# Patient Record
Sex: Female | Born: 1995 | Hispanic: No | Marital: Single | Smoking: Never smoker
Health system: Southern US, Community
[De-identification: ages and names within clinical notes are randomized; demographics above are authoritative.]

## PROBLEM LIST (undated history)

## (undated) ENCOUNTER — Inpatient Hospital Stay: Payer: Self-pay

## (undated) ENCOUNTER — Inpatient Hospital Stay (HOSPITAL_COMMUNITY): Payer: Self-pay

## (undated) DIAGNOSIS — F329 Major depressive disorder, single episode, unspecified: Secondary | ICD-10-CM

## (undated) DIAGNOSIS — F419 Anxiety disorder, unspecified: Secondary | ICD-10-CM

## (undated) DIAGNOSIS — R011 Cardiac murmur, unspecified: Secondary | ICD-10-CM

## (undated) DIAGNOSIS — F32A Depression, unspecified: Secondary | ICD-10-CM

## (undated) DIAGNOSIS — D573 Sickle-cell trait: Secondary | ICD-10-CM

## (undated) DIAGNOSIS — K2970 Gastritis, unspecified, without bleeding: Secondary | ICD-10-CM

## (undated) HISTORY — PX: NO PAST SURGERIES: SHX2092

## (undated) HISTORY — DX: Sickle-cell trait: D57.3

---
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
## 2015-09-18 ENCOUNTER — Encounter (HOSPITAL_COMMUNITY): Payer: Self-pay | Admitting: *Deleted

## 2015-09-18 ENCOUNTER — Emergency Department (HOSPITAL_COMMUNITY)
Admission: EM | Admit: 2015-09-18 | Discharge: 2015-09-18 | Disposition: A | Discharge status: Home / Self Care | Payer: Self-pay | Attending: Emergency Medicine | Admitting: Emergency Medicine

## 2015-09-18 DIAGNOSIS — R011 Cardiac murmur, unspecified: Secondary | ICD-10-CM | POA: Insufficient documentation

## 2015-09-18 DIAGNOSIS — Z3202 Encounter for pregnancy test, result negative: Secondary | ICD-10-CM | POA: Insufficient documentation

## 2015-09-18 DIAGNOSIS — R55 Syncope and collapse: Secondary | ICD-10-CM | POA: Insufficient documentation

## 2015-09-18 HISTORY — DX: Cardiac murmur, unspecified: R01.1

## 2015-09-18 LAB — URINALYSIS, ROUTINE W REFLEX MICROSCOPIC
Bilirubin Urine: NEGATIVE
Glucose, UA: NEGATIVE mg/dL
KETONES UR: NEGATIVE mg/dL
NITRITE: NEGATIVE
PROTEIN: NEGATIVE mg/dL
Specific Gravity, Urine: 1.011 (ref 1.005–1.030)
pH: 6.5 (ref 5.0–8.0)

## 2015-09-18 LAB — BASIC METABOLIC PANEL
Anion gap: 8 (ref 5–15)
BUN: 11 mg/dL (ref 6–20)
CO2: 25 mmol/L (ref 22–32)
CREATININE: 0.77 mg/dL (ref 0.44–1.00)
Calcium: 9.9 mg/dL (ref 8.9–10.3)
Chloride: 111 mmol/L (ref 101–111)
Glucose, Bld: 90 mg/dL (ref 65–99)
POTASSIUM: 3.6 mmol/L (ref 3.5–5.1)
SODIUM: 144 mmol/L (ref 135–145)

## 2015-09-18 LAB — URINE MICROSCOPIC-ADD ON

## 2015-09-18 LAB — CBC
HCT: 36.8 % (ref 36.0–46.0)
Hemoglobin: 12.3 g/dL (ref 12.0–15.0)
MCH: 27.6 pg (ref 26.0–34.0)
MCHC: 33.4 g/dL (ref 30.0–36.0)
MCV: 82.7 fL (ref 78.0–100.0)
PLATELETS: 191 10*3/uL (ref 150–400)
RBC: 4.45 MIL/uL (ref 3.87–5.11)
RDW: 13.3 % (ref 11.5–15.5)
WBC: 8.2 10*3/uL (ref 4.0–10.5)

## 2015-09-18 LAB — I-STAT BETA HCG BLOOD, ED (MC, WL, AP ONLY)

## 2015-09-18 LAB — CBG MONITORING, ED: GLUCOSE-CAPILLARY: 76 mg/dL (ref 65–99)

## 2015-09-18 MED ORDER — NITROFURANTOIN MONOHYD MACRO 100 MG PO CAPS: 100.0000 mg | ORAL_CAPSULE | Freq: Two times a day (BID) | ORAL | Status: DC

## 2015-09-18 MED ORDER — SODIUM CHLORIDE 0.9 % IV BOLUS (SEPSIS)
1000.0000 mL | Freq: Once | INTRAVENOUS | Status: AC
  Administered 2015-09-18: 1000 mL via INTRAVENOUS

## 2015-09-18 NOTE — Discharge Instructions (Signed)

## 2015-09-18 NOTE — ED Notes (Signed)
Patient was alert, oriented and stable upon discharge. RN went over AVS and patient had no further questions.  

## 2015-09-18 NOTE — ED Notes (Signed)
I ATTEMPTED TO COLLECT LABS AND WAS UNSUCCESSFUL. 

## 2015-09-18 NOTE — ED Provider Notes (Signed)
CSN: [id number]     Arrival date & time 09/18/15  1911 History   First MD Initiated Contact with Patient 09/18/15 1959     Chief Complaint  Patient presents with  . Loss of Consciousness    HPI   20 year old female presents today with syncope. Patient reports that she was at the gym running for approximately 2 hours. After that session she went and had a personal training session. She reports that throughout the drink session she was fatigue, feeling lightheaded and dizzy after the training session when she passed out. She denies any injuries from the fall, denies any loss of memory to the events surrounding the fall including presyncopal and post syncopal. No seizure-like activity. Patient reports she's had an event like this before, that was due to her not eating enough food. Patient reports that she did eat a small amount of food at 11:00 today. She denies any preceding chest pain, shortness of breath, nausea or vomiting. She denies any significant past medical history including heart problems, DVT or PE. She is not taking estrogen she has no other risk factors for DVT or PE. She does not smoke, she does not use drugs or alcohol, she has otherwise healthy lifestyle. Patient has no significant family history of cardiac dysfunction including young members of the family dying and a young age from cardiac disease. Patient does not have any exertional dyspnea or chest pain. At the time of evaluation patient reports that she's feeling much better, continues to deny any the above concerning signs or symptoms. She reports that she did not want to come to the emergency room but EMS indicated it was necessary for further evaluation.      Past Medical History  Diagnosis Date  . Heart murmur    History reviewed. No pertinent past surgical history. History reviewed. No pertinent family history. Social History  Substance Use Topics  . Smoking status: Never Smoker   . Smokeless tobacco: None  . Alcohol  Use: None   OB History    No data available     Review of Systems  All other systems reviewed and are negative.    Allergies  Peanuts  Home Medications   Prior to Admission medications   Medication Sig Start Date End Date Taking? Authorizing Provider  nitrofurantoin, macrocrystal-monohydrate, (MACROBID) 100 MG capsule Take 1 capsule (100 mg total) by mouth 2 (two) times daily. 09/18/15   Eyvonne Mechanic, PA-C   BP 111/77 mmHg  Pulse 77  Temp(Src) 98.2 F (36.8 C) (Oral)  Resp 16  Ht  (1.778 m)  Wt 55.792 kg  BMI 17.65 kg/m2  SpO2 99%  LMP 08/18/2015 (Approximate) Physical Exam  Constitutional: She is oriented to person, place, and time. She appears well-developed and well-nourished.  HENT:  Head: Normocephalic and atraumatic.  Eyes: Conjunctivae are normal. Pupils are equal, round, and reactive to light. Right eye exhibits no discharge. Left eye exhibits no discharge. No scleral icterus.  Neck: Normal range of motion. No JVD present. No tracheal deviation present.  Cardiovascular: Normal rate, regular rhythm, normal heart sounds and intact distal pulses.  Exam reveals no gallop and no friction rub.   No murmur heard. Pulmonary/Chest: Effort normal. No stridor. No respiratory distress. She has no wheezes. She has no rales.  Abdominal: Soft. She exhibits no distension and no mass. There is no tenderness. There is no rebound and no guarding.  Musculoskeletal: Normal range of motion. She exhibits no edema.  Neurological: She is alert  and oriented to person, place, and time. She has normal strength. No cranial nerve deficit or sensory deficit. Coordination normal. GCS eye subscore is 4. GCS verbal subscore is 5. GCS motor subscore is 6.  Skin: Skin is warm and dry. No rash noted. No erythema. No pallor.  Psychiatric: She has a normal mood and affect. Her behavior is normal. Judgment and thought content normal.  Nursing note and vitals reviewed.   ED Course  Procedures  (including critical care time) Labs Review Labs Reviewed  URINALYSIS, ROUTINE W REFLEX MICROSCOPIC (NOT AT [facility]) - Abnormal; [facility] for the following:    Hgb urine dipstick TRACE (*)    Leukocytes, UA LARGE (*)    All other components within normal limits  URINE MICROSCOPIC-ADD ON - Abnormal; Notable for the following:    Squamous Epithelial / LPF TOO NUMEROUS TO COUNT (*)    Bacteria, UA MANY (*)    All other components within normal limits  URINE CULTURE  BASIC METABOLIC PANEL  CBC  CBG MONITORING, ED  I-STAT BETA HCG BLOOD, ED ([facility], [facility], [facility] ONLY)    Imaging Review No results found. I have personally reviewed and evaluated these images and lab results as part of my medical decision-making.   EKG Interpretation   Date/Time:  Monday September 18 2015 19:22:35 EST Ventricular Rate:  87 PR Interval:  179 QRS Duration: 82 QT Interval:  338 QTC Calculation: 407 R Axis:   87 Text Interpretation:  Sinus rhythm LAE, consider biatrial enlargement RSR'  in V1 or V2, probably normal variant Nonspecific T abnrm, anterolateral  leads   No significant change since last tracing Confirmed by Ethelda Chick   MD, SAM [phone]) on 09/18/2015 8:10:32 PM      MDM   Final diagnoses:  Syncope, unspecified syncope type    Labs: Urine culture, i-STAT beta-hCG, CBC, BMP, urinalysis- large leukocytes WBC 1630, many bacteria, too numerous to count squamous epithelial cells  Imaging:  Consults:  Therapeutics: Normal saline  Discharge Meds:   Assessment/Plan: Patient presents status post syncope. This was most likely due to her exhaustion from physical activity. She does not have any signs or symptoms that would indicate cardiac no pulmonary related etiology. Patient is an otherwise healthy young female, is back to her baseline during my evaluation with no complaints. She has no significant findings on historical evaluation, diagnostic or laboratory evaluation here that would necessitate further  evaluation or management. Patient is happy and pleasant throw the events week. Since his. She did incidentally have a urinary tract infection, repeat evaluation of urinary color clarity and characteristics revealed the patient did have darker borders urine recently. She reports that she has been seen before with incidental findings of urinary tract infection. She was given a prescription for antibiotics and instructed to initiate therapy. Patient verbalized understanding and agreement for today's plan had no further questions or concerns at the time of discharge. She is encouraged follow-up with her primary care provider for reevaluation.        Eyvonne Mechanic, PA-C 09/19/15 0153  Lyndal Pulley, MD 09/20/15 [phone]

## 2015-09-18 NOTE — ED Notes (Signed)
Per [facility] - pt from the gym s/p syncope - pt admits she has been working out x2 hrs and has not eaten since 11am. Pt w/o orthostatic changes for EMS, c/o dizziness and nausea at present.

## 2015-09-18 NOTE — ED Notes (Signed)
Bed: WJ19 Expected date:  Expected time:  Means of arrival:  Comments: EMS- syncopal at gym, dizziness

## 2015-09-20 LAB — URINE CULTURE
Culture: >=100000
Special Requests: NORMAL

## 2016-05-07 ENCOUNTER — Encounter (HOSPITAL_COMMUNITY): Payer: Self-pay | Admitting: Emergency Medicine

## 2016-05-07 ENCOUNTER — Emergency Department (HOSPITAL_COMMUNITY)
Admission: EM | Admit: 2016-05-07 | Discharge: 2016-05-07 | Disposition: A | Discharge status: Home / Self Care | Payer: 59 | Attending: Emergency Medicine | Admitting: Emergency Medicine

## 2016-05-07 DIAGNOSIS — Z791 Long term (current) use of non-steroidal anti-inflammatories (NSAID): Secondary | ICD-10-CM | POA: Insufficient documentation

## 2016-05-07 DIAGNOSIS — R45851 Suicidal ideations: Secondary | ICD-10-CM | POA: Diagnosis present

## 2016-05-07 DIAGNOSIS — Z79899 Other long term (current) drug therapy: Secondary | ICD-10-CM | POA: Diagnosis not present

## 2016-05-07 DIAGNOSIS — F329 Major depressive disorder, single episode, unspecified: Secondary | ICD-10-CM | POA: Diagnosis not present

## 2016-05-07 DIAGNOSIS — F32A Depression, unspecified: Secondary | ICD-10-CM

## 2016-05-07 HISTORY — DX: Major depressive disorder, single episode, unspecified: F32.9

## 2016-05-07 HISTORY — DX: Depression, unspecified: F32.A

## 2016-05-07 NOTE — ED Provider Notes (Signed)
[facility]-EMERGENCY DEPT Provider Note   CSN: [id number] Arrival date & time: 05/07/16  1038     History   Chief Complaint Chief Complaint  Patient presents with  . Suicidal   Chief complaint depression HPI [name] is a 20 y.o. female.  HPI patient reports that she suffers from long-standing depression over poor health and family members" family issues" she spoke with a counselor at [facility] earlier today who brought her here for further evaluation. Patient vehemently denies feeling suicidal or wanting to harm herself. She doesn't wish counseling for depression. Nothing makes symptoms better or worse. No treatment prior to coming here no other associated symptoms Past Medical History:  Diagnosis Date  . Depression   . Heart murmur     There are no active problems to display for this patient.   History reviewed. No pertinent surgical history.  OB History    No data available       Home Medications    Prior to Admission medications   Medication Sig Start Date End Date Taking? Authorizing Provider  ibuprofen (ADVIL,MOTRIN) 200 MG tablet Take 200 mg by mouth every 6 (six) hours as needed for headache or moderate pain.   Yes Historical Provider, MD  Multiple Vitamin (MULTIVITAMIN WITH MINERALS) TABS tablet Take 1 tablet by mouth daily.   Yes Historical Provider, MD    Family History No family history on file.  Social History Social History  Substance Use Topics  . Smoking status: Never Smoker  . Smokeless tobacco: Never Used  . Alcohol use Not on file   Positive smoker occasional alcohol no illicit drug use  Allergies   Peanuts [peanut oil]   Review of Systems Review of Systems  Constitutional: Negative.   HENT: Negative.   Respiratory: Negative.   Cardiovascular: Negative.   Gastrointestinal: Negative.   Musculoskeletal: Negative.   Skin: Negative.   Neurological: Negative.   Psychiatric/Behavioral: Positive for dysphoric mood.  All other systems  reviewed and are negative.    Physical Exam Updated Vital Signs BP 122/84 (BP Location: Right Arm)   Pulse 94   Resp 22   Ht 5\' 10"  (1.778 m)   Wt 124 lb (56.2 kg)   SpO2 93%   BMI 17.79 kg/m   Physical Exam  Constitutional: She is oriented to person, place, and time. She appears well-developed and well-nourished. No distress.  HENT:  Head: Normocephalic and atraumatic.  Eyes: Conjunctivae and EOM are normal.  Neck: Neck supple. No tracheal deviation present.  Cardiovascular: Normal rate.   No murmur heard. Pulmonary/Chest: Effort normal.  Abdominal: Soft. She exhibits no distension.  Musculoskeletal: Normal range of motion. She exhibits no edema.  Neurological: She is alert and oriented to person, place, and time. No cranial nerve deficit. Coordination normal.  Gait normal  Skin: Skin is warm and dry. No rash noted.  Psychiatric: She has a normal mood and affect.  Nursing note and vitals reviewed.    ED Treatments / Results  Labs (all labs ordered are listed, but only abnormal results are displayed) Labs Reviewed  COMPREHENSIVE METABOLIC PANEL  ETHANOL  SALICYLATE LEVEL  ACETAMINOPHEN LEVEL  CBC  URINE RAPID DRUG SCREEN, HOSP PERFORMED    EKG  EKG Interpretation None       Radiology No results found.  Procedures Procedures (including critical care time)  Medications Ordered in ED Medications - No data to display   Initial Impression / Assessment and Plan / ED Course  I have reviewed the  triage vital signs and the nursing notes.  Pertinent labs & imaging results that were available during my care of the patient were reviewed by me and considered in my medical decision making (see chart for details). 1:30 PM patient again vehemently denies that she wants to harm herself. She'll be referred for outpatient counseling Clinical Course    TTS consulted to arrange for outpatient counseling. I don't believe patient is suicidal risk. She's been advised to  call [phone] if she has any thoughts of harming herself  Final Clinical Impressions(s) / ED Diagnoses  Diagnosis depression Final diagnoses:  None    New Prescriptions New Prescriptions   No medications on file     Doug SouSam Shirlena Brinegar, MD 05/07/16 1334
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
## 2016-05-07 NOTE — ED Triage Notes (Addendum)
Pt reports feelings of depression and acute SI for about one week.  Reports hx of depression.  Pt reports feeling nauseous and extremely anxious at this time.  Denies emesis or abdominal issues.  Reports that she "does not have a plan" but her counselor, who brought her to ED, claims otherwise.  Counselor reports that "something happened last night" but pt refuses to speak about it.  Pt is pacing in room and tried to walk out the door once.  Pt displays aggressive behavior towards counselor when pressed for details about her plan.

## 2016-05-07 NOTE — ED Notes (Signed)
Patient has three bags of belongings in locker 31.

## 2016-05-07 NOTE — Discharge Instructions (Signed)
Call any of the resources furnished to you for counseling with depression. Or you can go to your counselor [facility]. If you have any thoughts of harming yourself, call 911 immediately

## 2016-05-07 NOTE — Progress Notes (Signed)
Pt [facility] by [facility] staff  Pt states she has insurance with [employer] but did not bring her insurance card Pt listed per ED registration as uninsured at this time with [place] address  Pt encouraged to bring her insurance card back in to ED registration

## 2016-05-07 NOTE — ED Notes (Signed)
Patient seen attempting to leave. Security paged to escort patient back to hall bed A. Dr. Ethelda ChickJacubowitz informed of same. States "she isn't suicidal, she's just waiting for TTS consult". TTS at bedside.

## 2016-05-07 NOTE — ED Notes (Signed)
Bed: WTR6 Expected date:  Expected time:  Means of arrival:  Comments: 

## 2016-05-07 NOTE — ED Notes (Signed)
Per EDP Jacubowitz, no labs or urine needed.

## 2016-05-07 NOTE — ED Notes (Signed)
Patient given belongings from locker 31, one black bookbag and two patient belongings bags.

## 2016-05-07 NOTE — ED Notes (Signed)
Pt ambulatory to lobby. NAD noted. 

## 2016-05-27 ENCOUNTER — Inpatient Hospital Stay (HOSPITAL_COMMUNITY)
Admission: AD | Admit: 2016-05-27 | Discharge: 2016-05-27 | Disposition: A | Discharge status: Home / Self Care | Payer: 59 | Source: Ambulatory Visit | Attending: Obstetrics and Gynecology | Admitting: Obstetrics and Gynecology

## 2016-05-27 ENCOUNTER — Inpatient Hospital Stay (HOSPITAL_COMMUNITY): Payer: 59

## 2016-05-27 ENCOUNTER — Encounter (HOSPITAL_COMMUNITY): Payer: Self-pay | Admitting: *Deleted

## 2016-05-27 DIAGNOSIS — O26891 Other specified pregnancy related conditions, first trimester: Secondary | ICD-10-CM | POA: Diagnosis not present

## 2016-05-27 DIAGNOSIS — R102 Pelvic and perineal pain: Secondary | ICD-10-CM

## 2016-05-27 DIAGNOSIS — O3680X0 Pregnancy with inconclusive fetal viability, not applicable or unspecified: Secondary | ICD-10-CM

## 2016-05-27 DIAGNOSIS — O9989 Other specified diseases and conditions complicating pregnancy, childbirth and the puerperium: Secondary | ICD-10-CM | POA: Diagnosis not present

## 2016-05-27 LAB — CBC
HEMATOCRIT: 37.8 % (ref 36.0–46.0)
HEMOGLOBIN: 13 g/dL (ref 12.0–15.0)
MCH: 27.6 pg (ref 26.0–34.0)
MCHC: 34.4 g/dL (ref 30.0–36.0)
MCV: 80.3 fL (ref 78.0–100.0)
Platelets: 222 10*3/uL (ref 150–400)
RBC: 4.71 MIL/uL (ref 3.87–5.11)
RDW: 12.6 % (ref 11.5–15.5)
WBC: 7.4 10*3/uL (ref 4.0–10.5)

## 2016-05-27 LAB — HCG, QUANTITATIVE, PREGNANCY: hCG, Beta Chain, Quant, S: 1579 m[IU]/mL — ABNORMAL HIGH (ref <5)

## 2016-05-27 LAB — URINALYSIS, ROUTINE W REFLEX MICROSCOPIC
Bilirubin Urine: NEGATIVE
GLUCOSE, UA: NEGATIVE mg/dL
HGB URINE DIPSTICK: NEGATIVE
Ketones, ur: NEGATIVE mg/dL
Leukocytes, UA: NEGATIVE
Nitrite: NEGATIVE
PH: 6.5 (ref 5.0–8.0)
Protein, ur: NEGATIVE mg/dL

## 2016-05-27 LAB — WET PREP, GENITAL
SPERM: NONE SEEN
Trich, Wet Prep: NONE SEEN
Yeast Wet Prep HPF POC: NONE SEEN

## 2016-05-27 LAB — ABO/RH: ABO/RH(D): AB POS

## 2016-05-27 LAB — POCT PREGNANCY, URINE: Preg Test, Ur: POSITIVE — AB

## 2016-05-27 NOTE — Discharge Instructions (Signed)
First Trimester of Pregnancy The first trimester of pregnancy is from week 1 until the end of week 12 (months 1 through 3). A week after a sperm fertilizes an egg, the egg will implant on the wall of the uterus. This embryo will begin to develop into a baby. Genes from you and your partner are forming the baby. The female genes determine whether the baby is a boy or a girl. At 6-8 weeks, the eyes and face are formed, and the heartbeat can be seen on ultrasound. At the end of 12 weeks, all the baby's organs are formed.  Now that you are pregnant, you will want to do everything you can to have a healthy baby. Two of the most important things are to get good prenatal care and to follow your health care provider's instructions. Prenatal care is all the medical care you receive before the baby's birth. This care will help prevent, find, and treat any problems during the pregnancy and childbirth. BODY CHANGES Your body goes through many changes during pregnancy. The changes vary from woman to woman.   You may gain or lose a couple of pounds at first.  You may feel sick to your stomach (nauseous) and throw up (vomit). If the vomiting is uncontrollable, call your health care provider.  You may tire easily.  You may develop headaches that can be relieved by medicines approved by your health care provider.  You may urinate more often. Painful urination may mean you have a bladder infection.  You may develop heartburn as a result of your pregnancy.  You may develop constipation because certain hormones are causing the muscles that push waste through your intestines to slow down.  You may develop hemorrhoids or swollen, bulging veins (varicose veins).  Your breasts may begin to grow larger and become tender. Your nipples may stick out more, and the tissue that surrounds them (areola) may become darker.  Your gums may bleed and may be sensitive to brushing and flossing.  Dark spots or blotches (chloasma,  mask of pregnancy) may develop on your face. This will likely fade after the baby is born.  Your menstrual periods will stop.  You may have a loss of appetite.  You may develop cravings for certain kinds of food.  You may have changes in your emotions from day to day, such as being excited to be pregnant or being concerned that something may go wrong with the pregnancy and baby.  You may have more vivid and strange dreams.  You may have changes in your hair. These can include thickening of your hair, rapid growth, and changes in texture. Some women also have hair loss during or after pregnancy, or hair that feels dry or thin. Your hair will most likely return to normal after your baby is born. WHAT TO EXPECT AT YOUR PRENATAL VISITS During a routine prenatal visit:  You will be weighed to make sure you and the baby are growing normally.  Your blood pressure will be taken.  Your abdomen will be measured to track your baby's growth.  The fetal heartbeat will be listened to starting around week 10 or 12 of your pregnancy.  Test results from any previous visits will be discussed. Your health care provider may ask you:  How you are feeling.  If you are feeling the baby move.  If you have had any abnormal symptoms, such as leaking fluid, bleeding, severe headaches, or abdominal cramping.  If you are using any tobacco products,   including cigarettes, chewing tobacco, and electronic cigarettes.  If you have any questions. Other tests that may be performed during your first trimester include:  Blood tests to find your blood type and to check for the presence of any previous infections. They will also be used to check for low iron levels (anemia) and Rh antibodies. Later in the pregnancy, blood tests for diabetes will be done along with other tests if problems develop.  Urine tests to check for infections, diabetes, or protein in the urine.  An ultrasound to confirm the proper growth  and development of the baby.  An amniocentesis to check for possible genetic problems.  Fetal screens for spina bifida and Down syndrome.  You may need other tests to make sure you and the baby are doing well.  HIV (human immunodeficiency virus) testing. Routine prenatal testing includes screening for HIV, unless you choose not to have this test. HOME CARE INSTRUCTIONS  Medicines  Follow your health care provider's instructions regarding medicine use. Specific medicines may be either safe or unsafe to take during pregnancy.  Take your prenatal vitamins as directed.  If you develop constipation, try taking a stool softener if your health care provider approves. Diet  Eat regular, well-balanced meals. Choose a variety of foods, such as meat or vegetable-based protein, fish, milk and low-fat dairy products, vegetables, fruits, and whole grain breads and cereals. Your health care provider will help you determine the amount of weight gain that is right for you.  Avoid raw meat and uncooked cheese. These carry germs that can cause birth defects in the baby.  Eating four or five small meals rather than three large meals a day may help relieve nausea and vomiting. If you start to feel nauseous, eating a few soda crackers can be helpful. Drinking liquids between meals instead of during meals also seems to help nausea and vomiting.  If you develop constipation, eat more high-fiber foods, such as fresh vegetables or fruit and whole grains. Drink enough fluids to keep your urine clear or pale yellow. Activity and Exercise  Exercise only as directed by your health care provider. Exercising will help you:  Control your weight.  Stay in shape.  Be prepared for labor and delivery.  Experiencing pain or cramping in the lower abdomen or low back is a good sign that you should stop exercising. Check with your health care provider before continuing normal exercises.  Try to avoid standing for long  periods of time. Move your legs often if you must stand in one place for a long time.  Avoid heavy lifting.  Wear low-heeled shoes, and practice good posture.  You may continue to have sex unless your health care provider directs you otherwise. Relief of Pain or Discomfort  Wear a good support bra for breast tenderness.   Take warm sitz baths to soothe any pain or discomfort caused by hemorrhoids. Use hemorrhoid cream if your health care provider approves.   Rest with your legs elevated if you have leg cramps or low back pain.  If you develop varicose veins in your legs, wear support hose. Elevate your feet for 15 minutes, 3-4 times a day. Limit salt in your diet. Prenatal Care  Schedule your prenatal visits by the twelfth week of pregnancy. They are usually scheduled monthly at first, then more often in the last 2 months before delivery.  Write down your questions. Take them to your prenatal visits.  Keep all your prenatal visits as directed by your   health care provider. Safety  Wear your seat belt at all times when driving.  Make a list of emergency phone numbers, including numbers for family, friends, the hospital, and police and fire departments. General Tips  Ask your health care provider for a referral to a local prenatal education class. Begin classes no later than at the beginning of month 6 of your pregnancy.  Ask for help if you have counseling or nutritional needs during pregnancy. Your health care provider can offer advice or refer you to specialists for help with various needs.  Do not use hot tubs, steam rooms, or saunas.  Do not douche or use tampons or scented sanitary pads.  Do not cross your legs for long periods of time.  Avoid cat litter boxes and soil used by cats. These carry germs that can cause birth defects in the baby and possibly loss of the fetus by miscarriage or stillbirth.  Avoid all smoking, herbs, alcohol, and medicines not prescribed by  your health care provider. Chemicals in these affect the formation and growth of the baby.  Do not use any tobacco products, including cigarettes, chewing tobacco, and electronic cigarettes. If you need help quitting, ask your health care provider. You may receive counseling support and other resources to help you quit.  Schedule a dentist appointment. At home, brush your teeth with a soft toothbrush and be gentle when you floss. SEEK MEDICAL CARE IF:   You have dizziness.  You have mild pelvic cramps, pelvic pressure, or nagging pain in the abdominal area.  You have persistent nausea, vomiting, or diarrhea.  You have a bad smelling vaginal discharge.  You have pain with urination.  You notice increased swelling in your face, hands, legs, or ankles. SEEK IMMEDIATE MEDICAL CARE IF:   You have a fever.  You are leaking fluid from your vagina.  You have spotting or bleeding from your vagina.  You have severe abdominal cramping or pain.  You have rapid weight gain or loss.  You vomit blood or material that looks like coffee grounds.  You are exposed to German measles and have never had them.  You are exposed to fifth disease or chickenpox.  You develop a severe headache.  You have shortness of breath.  You have any kind of trauma, such as from a fall or a car accident.   This information is not intended to replace advice given to you by your health care provider. Make sure you discuss any questions you have with your health care provider.   Document Released: 08/06/2001 Document Revised: 09/02/2014 Document Reviewed: 06/22/2013 Elsevier Interactive Patient Education 2016 [employer]  

## 2016-05-27 NOTE — MAU Note (Signed)
Pt states she is having lower abdominal cramping that has been going on for one week.  Pt states she missed her last period and has not taken a HPT.

## 2016-05-27 NOTE — MAU Provider Note (Signed)
History     CSN: [id number]  Arrival date and time: 05/27/16 1603   First Provider Initiated Contact with Patient 05/27/16 2139      Chief Complaint  Patient presents with  . Pelvic Pain   Pelvic Pain  The patient's primary symptoms include pelvic pain. This is a new problem. The current episode started in the past 7 days. The problem occurs intermittently. The problem has been unchanged. Pain severity now: 6/10  The problem affects both sides. She is pregnant. Associated symptoms include abdominal pain. Pertinent negatives include no chills, constipation, diarrhea, dysuria, fever, frequency, nausea, urgency or vomiting. The vaginal discharge was normal. There has been no bleeding. Nothing aggravates the symptoms. She has tried nothing for the symptoms. Her menstrual history has been regular (LMP 03/2916).   Past Medical History:  Diagnosis Date  . Depression   . Heart murmur     Past Surgical History:  Procedure Laterality Date  . NO PAST SURGERIES      History reviewed. No pertinent family history.  Social History  Substance Use Topics  . Smoking status: Never Smoker  . Smokeless tobacco: Never Used  . Alcohol use Not on file    Allergies:  Allergies  Allergen Reactions  . Peanuts [Peanut Oil] Anaphylaxis, Hives and Swelling    Tree nuts also     Prescriptions Prior to Admission  Medication Sig Dispense Refill Last Dose  . Multiple Vitamin (MULTIVITAMIN WITH MINERALS) TABS tablet Take 1 tablet by mouth daily.   05/27/2016 at Unknown time  . Phenylephrine-Pheniramine-DM (THERAFLU COLD & COUGH PO) Take 5 mLs by mouth daily as needed (cold symptoms).   Past Week at Unknown time  . ibuprofen (ADVIL,MOTRIN) 200 MG tablet Take 200 mg by mouth every 6 (six) hours as needed for headache or moderate pain.   Not Taking at Unknown time    Review of Systems  Constitutional: Negative for chills and fever.  Gastrointestinal: Positive for abdominal pain. Negative for  constipation, diarrhea, nausea and vomiting.  Genitourinary: Positive for pelvic pain. Negative for dysuria, frequency and urgency.   Physical Exam   Blood pressure 110/77, pulse 75, temperature 98.4 F (36.9 C), temperature source Oral, resp. rate 18, height 5\' 10"  (1.778 m), weight 128 lb (58.1 kg), last menstrual period 04/23/2016, SpO2 100 %.  Physical Exam  Nursing note and vitals reviewed. Constitutional: She is oriented to person, place, and time. She appears well-developed and well-nourished. No distress.  HENT:  Head: Normocephalic.  Cardiovascular: Normal rate.   Respiratory: Effort normal.  GI: Soft. There is no tenderness. There is no rebound.  Neurological: She is alert and oriented to person, place, and time.  Skin: Skin is warm and dry.  Psychiatric: She has a normal mood and affect.     Results for orders placed or performed during the hospital encounter of 05/27/16 (from the past 24 hour(s))  Urinalysis, Routine w reflex microscopic (not at [facility])     Status: Abnormal   Collection Time: 05/27/16  5:33 PM  Result Value Ref Range   Color, Urine STRAW (A) YELLOW   APPearance CLEAR CLEAR   Specific Gravity, Urine <1.005 (L) 1.005 - 1.030   pH 6.5 5.0 - 8.0   Glucose, UA NEGATIVE NEGATIVE mg/dL   Hgb urine dipstick NEGATIVE NEGATIVE   Bilirubin Urine NEGATIVE NEGATIVE   Ketones, ur NEGATIVE NEGATIVE mg/dL   Protein, ur NEGATIVE NEGATIVE mg/dL   Nitrite NEGATIVE NEGATIVE   Leukocytes, UA NEGATIVE NEGATIVE  Pregnancy,  urine POC     Status: Abnormal   Collection Time: 05/27/16  5:46 PM  Result Value Ref Range   Preg Test, Ur POSITIVE (A) NEGATIVE  CBC     Status: None   Collection Time: 05/27/16  8:21 PM  Result Value Ref Range   WBC 7.4 4.0 - 10.5 K/uL   RBC 4.71 3.87 - 5.11 MIL/uL   Hemoglobin 13.0 12.0 - 15.0 g/dL   HCT 16.1 09.6 - 04.5 %   MCV 80.3 78.0 - 100.0 fL   MCH 27.6 26.0 - 34.0 pg   MCHC 34.4 30.0 - 36.0 g/dL   RDW 40.9 81.1 - 91.4 %   Platelets  222 150 - 400 K/uL  hCG, quantitative, pregnancy     Status: Abnormal   Collection Time: 05/27/16  8:21 PM  Result Value Ref Range   hCG, Beta Chain, Quant, S 1,579 (H) <5 mIU/mL  ABO/Rh     Status: None (Preliminary result)   Collection Time: 05/27/16  8:21 PM  Result Value Ref Range   ABO/RH(D) AB POS   Wet prep, genital     Status: Abnormal   Collection Time: 05/27/16  9:26 PM  Result Value Ref Range   Yeast Wet Prep HPF POC NONE SEEN NONE SEEN   Trich, Wet Prep NONE SEEN NONE SEEN   Clue Cells Wet Prep HPF POC PRESENT (A) NONE SEEN   WBC, Wet Prep HPF POC FEW (A) NONE SEEN   Sperm NONE SEEN    US Ob Comp Less 14 Wks  Result Date: 05/27/2016 CLINICAL DATA:  Pelvic pain for 1 week. Pending quantitative beta HCG EXAM: OBSTETRIC <14 WK [place] AND TRANSVAGINAL OB US TECHNIQUE: Both transabdominal and transvaginal ultrasound examinations were performed for complete evaluation of the gestation as well as the maternal uterus, adnexal regions, and pelvic cul-de-sac. Transvaginal technique was performed to assess early pregnancy. COMPARISON:  None. FINDINGS: Intrauterine gestational sac: None Yolk sac:  None Embryo:  None Cardiac Activity: None Heart Rate: Not applicable MSD: Not applicable CRL:  Not applicable Subchorionic hemorrhage:  None visualized. Maternal uterus/adnexae: The uterus is anteverted without focal mass. Endometrial stripe is 13 mm in thickness. The ovaries are unremarkable. Intrauterine gestational sac: None IMPRESSION: No intrauterine or ectopic pregnancy identified. Electronically Signed   By: Tollie Eth M.D.   On: 05/27/2016 21:45   US Ob Transvaginal  Result Date: 05/27/2016 CLINICAL DATA:  Pelvic pain for 1 week. Pending quantitative beta HCG EXAM: OBSTETRIC <14 WK [place] AND TRANSVAGINAL OB US TECHNIQUE: Both transabdominal and transvaginal ultrasound examinations were performed for complete evaluation of the gestation as well as the maternal uterus, adnexal regions, and pelvic  cul-de-sac. Transvaginal technique was performed to assess early pregnancy. COMPARISON:  None. FINDINGS: Intrauterine gestational sac: None Yolk sac:  None Embryo:  None Cardiac Activity: None Heart Rate: Not applicable MSD: Not applicable CRL:  Not applicable Subchorionic hemorrhage:  None visualized. Maternal uterus/adnexae: The uterus is anteverted without focal mass. Endometrial stripe is 13 mm in thickness. The ovaries are unremarkable. Intrauterine gestational sac: None IMPRESSION: No intrauterine or ectopic pregnancy identified. Electronically Signed   By: Tollie Eth M.D.   On: 05/27/2016 21:45     MAU Course  Procedures  MDM   Assessment and Plan   1. Pregnancy, location unknown   2. Pelvic pain affecting pregnancy in first trimester, antepartum    DC home Comfort measures reviewed  1stTrimester precautions  Ectopic precautions RX: none  Return to MAU  as needed FU with clinic for repeat HCG on Thursday   Follow-up Information    [facility]   Specialty:  Obstetrics and Gynecology Why:  Thursday 05/30/16 8:00 am  Contact information: [address] [place] [id number] [phone]           Tawnya CrookHogan, Kiahna Banghart Donovan 05/27/2016, 9:40 PM

## 2016-05-28 LAB — GC/CHLAMYDIA PROBE AMP (~~LOC~~) NOT AT ARMC
Chlamydia: NEGATIVE
Neisseria Gonorrhea: NEGATIVE

## 2016-05-28 LAB — HIV ANTIBODY (ROUTINE TESTING W REFLEX): HIV Screen 4th Generation wRfx: NONREACTIVE

## 2016-05-28 LAB — RPR: RPR: NONREACTIVE

## 2016-05-30 ENCOUNTER — Ambulatory Visit: Payer: 59 | Admitting: *Deleted

## 2016-05-30 DIAGNOSIS — O3680X0 Pregnancy with inconclusive fetal viability, not applicable or unspecified: Secondary | ICD-10-CM

## 2016-05-30 LAB — HCG, QUANTITATIVE, PREGNANCY: HCG, BETA CHAIN, QUANT, S: 3500 m[IU]/mL — AB (ref <5)

## 2016-05-30 NOTE — Progress Notes (Signed)
Pt in for 48 hr repeat hcg level. She had ultrasound on 10/2 that showed no baby. HCG level on 10/2 was 1579. Pt denies bleeding or pain. Agreed to wait for hcg level results.  Pts results today are 3500. Reviewed with Dr. Vergie LivingPickens who recommends that patient have a 14 day ultrasound. Appointment made for 06/13/16 at 330. Pt aware of appointment. Also discussed reasons to return to MAU bleeding or increased pain. Pt voiced understanding.

## 2016-06-13 ENCOUNTER — Ambulatory Visit (HOSPITAL_COMMUNITY)
Admission: RE | Admit: 2016-06-13 | Discharge: 2016-06-13 | Disposition: A | Discharge status: Home / Self Care | Payer: 59 | Source: Ambulatory Visit | Attending: Obstetrics and Gynecology | Admitting: Obstetrics and Gynecology

## 2016-06-13 DIAGNOSIS — Z3A01 Less than 8 weeks gestation of pregnancy: Secondary | ICD-10-CM | POA: Diagnosis not present

## 2016-06-13 DIAGNOSIS — Z362 Encounter for other antenatal screening follow-up: Secondary | ICD-10-CM | POA: Insufficient documentation

## 2016-06-13 DIAGNOSIS — O3680X0 Pregnancy with inconclusive fetal viability, not applicable or unspecified: Secondary | ICD-10-CM

## 2016-06-24 ENCOUNTER — Telehealth: Payer: Self-pay | Admitting: *Deleted

## 2016-06-24 NOTE — Telephone Encounter (Signed)
Patient called front desk upset because she had an u/s on 10/19 and was told she would be called with results and she has not had a call. U/s results given to patient.

## 2016-07-02 LAB — OB RESULTS CONSOLE GC/CHLAMYDIA
Chlamydia: NEGATIVE
GC PROBE AMP, GENITAL: NEGATIVE

## 2016-07-02 LAB — OB RESULTS CONSOLE ANTIBODY SCREEN: Antibody Screen: NEGATIVE

## 2016-07-02 LAB — OB RESULTS CONSOLE HEPATITIS B SURFACE ANTIGEN: HEP B S AG: NEGATIVE

## 2016-07-02 LAB — OB RESULTS CONSOLE PLATELET COUNT: Platelets: 205 10*3/uL

## 2016-07-02 LAB — OB RESULTS CONSOLE VARICELLA ZOSTER ANTIBODY, IGG: Varicella: IMMUNE

## 2016-07-02 LAB — OB RESULTS CONSOLE ABO/RH: RH TYPE: POSITIVE

## 2016-07-02 LAB — OB RESULTS CONSOLE HGB/HCT, BLOOD
HEMATOCRIT: 38 %
HEMOGLOBIN: 12.8 g/dL

## 2016-07-02 LAB — SICKLE CELL SCREEN

## 2016-07-02 LAB — OB RESULTS CONSOLE RPR: RPR: NONREACTIVE

## 2016-07-02 LAB — OB RESULTS CONSOLE RUBELLA ANTIBODY, IGM: RUBELLA: IMMUNE

## 2016-07-02 LAB — OB RESULTS CONSOLE HIV ANTIBODY (ROUTINE TESTING): HIV: NONREACTIVE

## 2016-08-26 NOTE — L&D Delivery Note (Signed)
Delivery Note At 11:48 PM a viable female infant was delivered via Vaginal, Spontaneous Delivery (Presentation: direct OA).  APGAR: 8, 9; weight pending.   Placenta status: delivered spontaneously, intact.  Cord: 3VC with the following complications: none.  Cord pH: n/a  Anesthesia: none  Episiotomy: None Lacerations: 2nd degree;Vaginal;Perineal Suture Repair: 3.0 vicryl Est. Blood Loss (mL): 400  Mom to postpartum.  Baby to Couplet care / Skin to Skin.  Called to see patient.  Mom pushed to deliver a viable female infant.  The head followed by shoulders, which delivered without difficulty, and the rest of the body.  A single nuchal cord noted and reduced.  Baby to mom's chest.  Cord clamped and cut after > 1 min delay.  No cord blood obtained.  Placenta delivered spontaneously, intact, with a 3-vessel cord.  Second degree perineal laceration repaired with 3-0 Vicryl in standard fashion.  All counts correct.  Hemostasis obtained with IV pitocin and fundal massage. EBL 400 mL.     Gabriella MohairStephen Dorisann Schwanke, MD 02/03/2017, 12:21 AM

## 2016-09-13 ENCOUNTER — Observation Stay
Admission: EM | Admit: 2016-09-13 | Discharge: 2016-09-14 | Disposition: A | Discharge status: Home / Self Care | Payer: 59 | Attending: Obstetrics and Gynecology | Admitting: Obstetrics and Gynecology

## 2016-09-13 DIAGNOSIS — O9989 Other specified diseases and conditions complicating pregnancy, childbirth and the puerperium: Principal | ICD-10-CM | POA: Insufficient documentation

## 2016-09-13 DIAGNOSIS — Z3A21 21 weeks gestation of pregnancy: Secondary | ICD-10-CM | POA: Insufficient documentation

## 2016-09-13 DIAGNOSIS — O26899 Other specified pregnancy related conditions, unspecified trimester: Secondary | ICD-10-CM | POA: Diagnosis present

## 2016-09-13 DIAGNOSIS — R51 Headache: Secondary | ICD-10-CM | POA: Insufficient documentation

## 2016-09-13 DIAGNOSIS — R519 Headache, unspecified: Secondary | ICD-10-CM | POA: Diagnosis present

## 2016-09-13 NOTE — ED Notes (Signed)
Per LDR, pt should be cleared by ER at this time.

## 2016-09-13 NOTE — ED Notes (Signed)
Pt arrives to ED with c/o headache. Pt states that she had positive fetal heart tones x1 month ago at last appointment. Pt states that she has not felt fetal movement at yet but is experiencing no abdominal discomfort or pain.

## 2016-09-14 DIAGNOSIS — O9989 Other specified diseases and conditions complicating pregnancy, childbirth and the puerperium: Secondary | ICD-10-CM | POA: Diagnosis not present

## 2016-09-14 DIAGNOSIS — R519 Headache, unspecified: Secondary | ICD-10-CM | POA: Diagnosis present

## 2016-09-14 DIAGNOSIS — R51 Headache: Secondary | ICD-10-CM | POA: Diagnosis not present

## 2016-09-14 DIAGNOSIS — Z3A21 21 weeks gestation of pregnancy: Secondary | ICD-10-CM | POA: Diagnosis not present

## 2016-09-14 DIAGNOSIS — O26899 Other specified pregnancy related conditions, unspecified trimester: Secondary | ICD-10-CM | POA: Diagnosis present

## 2016-09-14 MED ORDER — ACETAMINOPHEN 500 MG PO TABS
1000.0000 mg | ORAL_TABLET | Freq: Four times a day (QID) | ORAL | Status: DC | PRN
  Administered 2016-09-14: 1000 mg via ORAL

## 2016-09-14 MED ORDER — ACETAMINOPHEN 500 MG PO TABS
ORAL_TABLET | ORAL | Status: AC
  Administered 2016-09-14: 1000 mg via ORAL
  Filled 2016-09-14: qty 2

## 2016-09-14 NOTE — Discharge Instructions (Signed)
Braxton Hicks Contractions °Contractions of the uterus can occur throughout pregnancy. Contractions are not always a sign that you are in labor.  °WHAT ARE BRAXTON HICKS CONTRACTIONS?  °Contractions that occur before labor are called Braxton Hicks contractions, or false labor. Toward the end of pregnancy (32-34 weeks), these contractions can develop more often and may become more forceful. This is not true labor because these contractions do not result in opening (dilatation) and thinning of the cervix. They are sometimes difficult to tell apart from true labor because these contractions can be forceful and people have different pain tolerances. You should not feel embarrassed if you go to the hospital with false labor. Sometimes, the only way to tell if you are in true labor is for your health care provider to look for changes in the cervix. °If there are no prenatal problems or other health problems associated with the pregnancy, it is completely safe to be sent home with false labor and await the onset of true labor. °HOW CAN YOU TELL THE DIFFERENCE BETWEEN TRUE AND FALSE LABOR? °False Labor  °· The contractions of false labor are usually shorter and not as hard as those of true labor.   °· The contractions are usually irregular.   °· The contractions are often felt in the front of the lower abdomen and in the groin.   °· The contractions may go away when you walk around or change positions while lying down.   °· The contractions get weaker and are shorter lasting as time goes on.   °· The contractions do not usually become progressively stronger, regular, and closer together as with true labor.   °True Labor  °· Contractions in true labor last 30-70 seconds, become very regular, usually become more intense, and increase in frequency.   °· The contractions do not go away with walking.   °· The discomfort is usually felt in the top of the uterus and spreads to the lower abdomen and low back.   °· True labor can be  determined by your health care provider with an exam. This will show that the cervix is dilating and getting thinner.   °WHAT TO REMEMBER °· Keep up with your usual exercises and follow other instructions given by your health care provider.   °· Take medicines as directed by your health care provider.   °· Keep your regular prenatal appointments.   °· Eat and drink lightly if you think you are going into labor.   °· If Braxton Hicks contractions are making you uncomfortable:   °¨ Change your position from lying down or resting to walking, or from walking to resting.   °¨ Sit and rest in a tub of warm water.   °¨ Drink 2-3 glasses of water. Dehydration may cause these contractions.   °¨ Do slow and deep breathing several times an hour.   °WHEN SHOULD I SEEK IMMEDIATE MEDICAL CARE? °Seek immediate medical care if: °· Your contractions become stronger, more regular, and closer together.   °· You have fluid leaking or gushing from your vagina.   °· You have a fever.   °· You pass blood-tinged mucus.   °· You have vaginal bleeding.   °· You have continuous abdominal pain.   °· You have low back pain that you never had before.   °· You feel your baby's head pushing down and causing pelvic pressure.   °· Your baby is not moving as much as it used to.   °This information is not intended to replace advice given to you by your health care provider. Make sure you discuss any questions you have with your health care   provider. Document Released: 08/12/2005 Document Revised: 12/04/2015 Document Reviewed: 05/24/2013 Elsevier Interactive Patient Education  2017 [employer] Inc[employer]   Migraine Headache A migraine headache is a very strong throbbing pain on one side or both sides of your head. Migraines can also cause other symptoms. Talk with your doctor about what things may bring on (trigger) your migraine headaches. Follow these instructions at home: Medicines  Take over-the-counter and prescription medicines only as told by  your doctor.  Do not drive or use heavy machinery while taking prescription pain medicine.  To prevent or treat constipation while you are taking prescription pain medicine, your doctor may recommend that you:  Drink enough fluid to keep your pee (urine) clear or pale yellow.  Take over-the-counter or prescription medicines.  Eat foods that are high in fiber. These include fresh fruits and vegetables, whole grains, and beans.  Limit foods that are high in fat and processed sugars. These include fried and sweet foods. Lifestyle  Avoid alcohol.  Do not use any products that contain nicotine or tobacco, such as cigarettes and e-cigarettes. If you need help quitting, ask your doctor.  Get at least 8 hours of sleep every night.  Limit your stress. General instructions  Keep a journal to find out what may bring on your migraines. For example, write down:  What you eat and drink.  How much sleep you get.  Any change in what you eat or drink.  Any change in your medicines.  If you have a migraine:  Avoid things that make your symptoms worse, such as bright lights.  It may help to lie down in a dark, quiet room.  Do not drive or use heavy machinery.  Ask your doctor what activities are safe for you.  Keep all follow-up visits as told by your doctor. This is important. Contact a doctor if:  You get a migraine that is different or worse than your usual migraines. Get help right away if:  Your migraine gets very bad.  You have a fever.  You have a stiff neck.  You have trouble seeing.  Your muscles feel weak or like you cannot control them.  You start to lose your balance a lot.  You start to have trouble walking.  You pass out (faint). This information is not intended to replace advice given to you by your health care provider. Make sure you discuss any questions you have with your health care provider. Document Released: 05/21/2008 Document Revised: 03/01/2016  Document Reviewed: 01/29/2016 Elsevier Interactive Patient Education  2017 Elsevier Inc.   Preeclampsia and Eclampsia Preeclampsia is a serious condition that develops only during pregnancy. It is also called toxemia of pregnancy. This condition causes high blood pressure along with other symptoms, such as swelling and headaches. These symptoms may develop as the condition gets worse. Preeclampsia may occur at 20 weeks of pregnancy or later. Diagnosing and treating preeclampsia early is very important. If not treated early, it can cause serious problems for you and your baby. One problem it can lead to is eclampsia, which is a condition that causes muscle jerking or shaking (convulsions or seizures) in the mother. Delivering your baby is the best treatment for preeclampsia or eclampsia. Preeclampsia and eclampsia symptoms usually go away after your baby is born. What are the causes? The cause of preeclampsia is not known. What increases the risk? The following risk factors make you more likely to develop preeclampsia:  Being pregnant for the first time.  Having had preeclampsia  during a past pregnancy.  Having a family history of preeclampsia.  Having high blood pressure.  Being pregnant with twins or triplets.  Being 67 or older.  Being African-American.  Having kidney disease or diabetes.  Having medical conditions such as lupus or blood diseases.  Being very overweight (obese). What are the signs or symptoms? The earliest signs of preeclampsia are:  High blood pressure.  Increased protein in your urine. Your health care provider will check for this at every visit before you give birth (prenatal visit). Other symptoms that may develop as the condition gets worse include:  Severe headaches.  Sudden weight gain.  Swelling of the hands, face, legs, and feet.  Nausea and vomiting.  Vision problems, such as blurred or double vision.  Numbness in the face, arms, legs,  and feet.  Urinating less than usual.  Dizziness.  Slurred speech.  Abdominal pain, especially upper abdominal pain.  Convulsions or seizures. Symptoms generally go away after giving birth. How is this diagnosed? There are no screening tests for preeclampsia. Your health care provider will ask you about symptoms and check for signs of preeclampsia during your prenatal visits. You may also have tests that include:  Urine tests.  Blood tests.  Checking your blood pressure.  Monitoring your babys heart rate.  Ultrasound. How is this treated? You and your health care provider will determine the treatment approach that is best for you. Treatment may include:  Having more frequent prenatal exams to check for signs of preeclampsia, if you have an increased risk for preeclampsia.  Bed rest.  Reducing how much salt (sodium) you eat.  Medicine to lower your blood pressure.  Staying in the hospital, if your condition is severe. There, treatment will focus on controlling your blood pressure and the amount of fluids in your body (fluid retention).  You may need to take medicine (magnesium sulfate) to prevent seizures. This medicine may be given as an injection or through an IV tube.  Delivering your baby early, if your condition gets worse. You may have your labor started with medicine (induced), or you may have a cesarean delivery. Follow these instructions at home: Eating and drinking  Drink enough fluid to keep your urine clear or pale yellow.  Eat a healthy diet that is low in sodium. Do not add salt to your food. Check nutrition labels to see how much sodium a food or beverage contains.  Avoid caffeine. Lifestyle  Do not use any products that contain nicotine or tobacco, such as cigarettes and e-cigarettes. If you need help quitting, ask your health care provider.  Do not use alcohol or drugs.  Avoid stress as much as possible. Rest and get plenty of sleep. General  instructions  Take over-the-counter and prescription medicines only as told by your health care provider.  When lying down, lie on your side. This keeps pressure off of your baby.  When sitting or lying down, raise (elevate) your feet. Try putting some pillows underneath your lower legs.  Exercise regularly. Ask your health care provider what kinds of exercise are best for you.  Keep all follow-up and prenatal visits as told by your health care provider. This is important. How is this prevented? To prevent preeclampsia or eclampsia from developing during another pregnancy:  Get proper medical care during pregnancy. Your health care provider may be able to prevent preeclampsia or diagnose and treat it early.  Your health care provider may have you take a low-dose aspirin or a  calcium supplement during your next pregnancy.  You may have tests of your blood pressure and kidney function after giving birth.  Maintain a healthy weight. Ask your health care provider for help managing weight gain during pregnancy.  Work with your health care provider to manage any long-term (chronic) health conditions you have, such as diabetes or kidney problems. Contact a health care provider if:  You gain more weight than expected.  You have headaches.  You have nausea or vomiting.  You have abdominal pain.  You feel dizzy or light-headed. Get help right away if:  You develop sudden or severe swelling anywhere in your body. This usually happens in the legs.  You gain 5 lbs (2.3 kg) or more during one week.  You have severe:  Abdominal pain.  Headaches.  Dizziness.  Vision problems.  Confusion.  Nausea or vomiting.  You have a seizure.  You have trouble moving any part of your body.  You develop numbness in any part of your body.  You have trouble speaking.  You have any abnormal bleeding.  You pass out. This information is not intended to replace advice given to you by your  health care provider. Make sure you discuss any questions you have with your health care provider. Document Released: 08/09/2000 Document Revised: 04/09/2016 Document Reviewed: 03/18/2016 Elsevier Interactive Patient Education  2017 [employer].

## 2016-09-17 NOTE — Discharge Summary (Signed)
Patient presented to L&D with complaint of headache, right-sided.  Denies visual changes and RUQ pain. She is normotensive. She has not yet felt the baby move this pregnancy.  Fetal heart tones found and normal.  Patient has not tried anything for her headache.  She was given Tylenol and food. She had resolution of her headache while on the unit with these measures. No pregnancy concerns. No concerning neurological findings.   Discharged in stable condition.  Routine follow up. Thomasene MohairStephen Lilliam Chamblee, MD 09/17/2016 7:48 AM

## 2016-11-05 ENCOUNTER — Other Ambulatory Visit: Payer: 59

## 2016-11-05 ENCOUNTER — Ambulatory Visit (INDEPENDENT_AMBULATORY_CARE_PROVIDER_SITE_OTHER): Payer: 59 | Admitting: Advanced Practice Midwife

## 2016-11-05 VITALS — BP 128/54 | Wt 142.0 lb

## 2016-11-05 DIAGNOSIS — Z3A28 28 weeks gestation of pregnancy: Secondary | ICD-10-CM

## 2016-11-05 DIAGNOSIS — Z113 Encounter for screening for infections with a predominantly sexual mode of transmission: Secondary | ICD-10-CM

## 2016-11-05 DIAGNOSIS — Z3400 Encounter for supervision of normal first pregnancy, unspecified trimester: Secondary | ICD-10-CM

## 2016-11-05 DIAGNOSIS — Z131 Encounter for screening for diabetes mellitus: Secondary | ICD-10-CM

## 2016-11-05 NOTE — Progress Notes (Signed)
28 week labs today.  

## 2016-11-05 NOTE — Progress Notes (Signed)
Doing well. 28 week labs today. Reviewed remainder of prenatal care. Questions answered about normal

## 2016-11-06 LAB — 28 WEEK RH+PANEL
BASOS ABS: 0 10*3/uL (ref 0.0–0.2)
Basos: 0 %
EOS (ABSOLUTE): 0.2 10*3/uL (ref 0.0–0.4)
EOS: 2 %
Gestational Diabetes Screen: 84 mg/dL (ref 65–139)
HEMATOCRIT: 33.3 % — AB (ref 34.0–46.6)
HIV Screen 4th Generation wRfx: NONREACTIVE
Hemoglobin: 11.2 g/dL (ref 11.1–15.9)
IMMATURE GRANULOCYTES: 2 %
Immature Grans (Abs): 0.1 10*3/uL (ref 0.0–0.1)
LYMPHS ABS: 1.4 10*3/uL (ref 0.7–3.1)
Lymphs: 18 %
MCH: 28.1 pg (ref 26.6–33.0)
MCHC: 33.6 g/dL (ref 31.5–35.7)
MCV: 84 fL (ref 79–97)
MONOS ABS: 0.6 10*3/uL (ref 0.1–0.9)
Monocytes: 7 %
NEUTROS PCT: 71 %
Neutrophils Absolute: 5.7 10*3/uL (ref 1.4–7.0)
PLATELETS: 134 10*3/uL — AB (ref 150–379)
RBC: 3.99 x10E6/uL (ref 3.77–5.28)
RDW: 13.3 % (ref 12.3–15.4)
RPR: NONREACTIVE
WBC: 8 10*3/uL (ref 3.4–10.8)

## 2016-11-19 ENCOUNTER — Ambulatory Visit (INDEPENDENT_AMBULATORY_CARE_PROVIDER_SITE_OTHER): Payer: 59 | Admitting: Obstetrics and Gynecology

## 2016-11-19 VITALS — BP 110/60 | Wt 144.0 lb

## 2016-11-19 DIAGNOSIS — Z23 Encounter for immunization: Secondary | ICD-10-CM | POA: Diagnosis not present

## 2016-11-19 DIAGNOSIS — Z3400 Encounter for supervision of normal first pregnancy, unspecified trimester: Secondary | ICD-10-CM

## 2016-11-19 DIAGNOSIS — Z3A30 30 weeks gestation of pregnancy: Secondary | ICD-10-CM

## 2016-11-19 DIAGNOSIS — D573 Sickle-cell trait: Secondary | ICD-10-CM

## 2016-11-19 NOTE — Addendum Note (Signed)
Addended by: Cornelius MorasPATTERSON, Geovannie Vilar D on: 11/19/2016 02:13 PM   Modules accepted: Orders

## 2016-11-19 NOTE — Progress Notes (Signed)
Pos PNVs. No VB, LOF. Doing well. TdAP today. Breast. OCPs vs patch/nuvaring/IUD. Discussed sickle cell testing for FOB. Urine C&S today.

## 2016-11-21 LAB — URINE CULTURE: Organism ID, Bacteria: NO GROWTH

## 2016-12-02 ENCOUNTER — Encounter: Payer: Self-pay | Admitting: Certified Nurse Midwife

## 2016-12-02 ENCOUNTER — Ambulatory Visit (INDEPENDENT_AMBULATORY_CARE_PROVIDER_SITE_OTHER): Payer: 59 | Admitting: Certified Nurse Midwife

## 2016-12-02 VITALS — BP 102/62 | Wt 147.0 lb

## 2016-12-02 DIAGNOSIS — Z3403 Encounter for supervision of normal first pregnancy, third trimester: Secondary | ICD-10-CM

## 2016-12-02 DIAGNOSIS — Z3A31 31 weeks gestation of pregnancy: Secondary | ICD-10-CM

## 2016-12-02 NOTE — Progress Notes (Signed)
Pt c/o irregular slight menstrual type cramping. No bleeding.

## 2016-12-02 NOTE — Progress Notes (Signed)
Feeling occasional contraction and cramping, but infrequent, lasts for a minute. Baby active. No bleeding. Schedule for childbirth classes given to patient. Cord blood banking discussed.  ROB in 2 weeks. FOB here. No family history of sickle cell anemia. Grew up in [place]. No prior history of sickle trait

## 2016-12-10 ENCOUNTER — Telehealth: Payer: Self-pay

## 2016-12-10 NOTE — Telephone Encounter (Signed)
Pt called.  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  She wants to know if it's safe for her to be tested.  33wks.  Adv via receptionist per protocol it is okay for her to have a TB skin test or blood draw.

## 2016-12-16 ENCOUNTER — Ambulatory Visit (INDEPENDENT_AMBULATORY_CARE_PROVIDER_SITE_OTHER): Payer: 59 | Admitting: Obstetrics and Gynecology

## 2016-12-16 VITALS — BP 106/52 | Wt 150.0 lb

## 2016-12-16 DIAGNOSIS — Z3403 Encounter for supervision of normal first pregnancy, third trimester: Secondary | ICD-10-CM

## 2016-12-16 DIAGNOSIS — Z3A34 34 weeks gestation of pregnancy: Secondary | ICD-10-CM

## 2016-12-16 NOTE — Patient Instructions (Signed)
Third Trimester of Pregnancy The third trimester is from week 28 through week 40 (months 7 through 9). The third trimester is a time when the unborn baby (fetus) is growing rapidly. At the end of the ninth month, the fetus is about 20 inches in length and weighs 6-10 pounds. Body changes during your third trimester Your body will continue to go through many changes during pregnancy. The changes vary from woman to woman. During the third trimester:  Your weight will continue to increase. You can expect to gain 25-35 pounds (11-16 kg) by the end of the pregnancy.  You may begin to get stretch marks on your hips, abdomen, and breasts.  You may urinate more often because the fetus is moving lower into your pelvis and pressing on your bladder.  You may develop or continue to have heartburn. This is caused by increased hormones that slow down muscles in the digestive tract.  You may develop or continue to have constipation because increased hormones slow digestion and cause the muscles that push waste through your intestines to relax.  You may develop hemorrhoids. These are swollen veins (varicose veins) in the rectum that can itch or be painful.  You may develop swollen, bulging veins (varicose veins) in your legs.  You may have increased body aches in the pelvis, back, or thighs. This is due to weight gain and increased hormones that are relaxing your joints.  You may have changes in your hair. These can include thickening of your hair, rapid growth, and changes in texture. Some women also have hair loss during or after pregnancy, or hair that feels dry or thin. Your hair will most likely return to normal after your baby is born.  Your breasts will continue to grow and they will continue to become tender. A yellow fluid (colostrum) may leak from your breasts. This is the first milk you are producing for your baby.  Your belly button may stick out.  You may notice more swelling in your hands,  face, or ankles.  You may have increased tingling or numbness in your hands, arms, and legs. The skin on your belly may also feel numb.  You may feel short of breath because of your expanding uterus.  You may have more problems sleeping. This can be caused by the size of your belly, increased need to urinate, and an increase in your body's metabolism.  You may notice the fetus "dropping," or moving lower in your abdomen (lightening).  You may have increased vaginal discharge.  You may notice your joints feel loose and you may have pain around your pelvic bone.  What to expect at prenatal visits You will have prenatal exams every 2 weeks until week 36. Then you will have weekly prenatal exams. During a routine prenatal visit:  You will be weighed to make sure you and the baby are growing normally.  Your blood pressure will be taken.  Your abdomen will be measured to track your baby's growth.  The fetal heartbeat will be listened to.  Any test results from the previous visit will be discussed.  You may have a cervical check near your due date to see if your cervix has softened or thinned (effaced).  You will be tested for Group B streptococcus. This happens between 35 and 37 weeks.  Your health care provider may ask you:  What your birth plan is.  How you are feeling.  If you are feeling the baby move.  If you have had   any abnormal symptoms, such as leaking fluid, bleeding, severe headaches, or abdominal cramping.  If you are using any tobacco products, including cigarettes, chewing tobacco, and electronic cigarettes.  If you have any questions.  Other tests or screenings that may be performed during your third trimester include:  Blood tests that check for low iron levels (anemia).  Fetal testing to check the health, activity level, and growth of the fetus. Testing is done if you have certain medical conditions or if there are problems during the  pregnancy.  Nonstress test (NST). This test checks the health of your baby to make sure there are no signs of problems, such as the baby not getting enough oxygen. During this test, a belt is placed around your belly. The baby is made to move, and its heart rate is monitored during movement.  What is false labor? False labor is a condition in which you feel small, irregular tightenings of the muscles in the womb (contractions) that usually go away with rest, changing position, or drinking water. These are called Braxton Hicks contractions. Contractions may last for hours, days, or even weeks before true labor sets in. If contractions come at regular intervals, become more frequent, increase in intensity, or become painful, you should see your health care provider. What are the signs of labor?  Abdominal cramps.  Regular contractions that start at 10 minutes apart and become stronger and more frequent with time.  Contractions that start on the top of the uterus and spread down to the lower abdomen and back.  Increased pelvic pressure and dull back pain.  A watery or bloody mucus discharge that comes from the vagina.  Leaking of amniotic fluid. This is also known as your "water breaking." It could be a slow trickle or a gush. Let your health care provider know if it has a color or strange odor. If you have any of these signs, call your health care provider right away, even if it is before your due date. Follow these instructions at home: Medicines  Follow your health care provider's instructions regarding medicine use. Specific medicines may be either safe or unsafe to take during pregnancy.  Take a prenatal vitamin that contains at least 600 micrograms (mcg) of folic acid.  If you develop constipation, try taking a stool softener if your health care provider approves. Eating and drinking  Eat a balanced diet that includes fresh fruits and vegetables, whole grains, good sources of protein  such as meat, eggs, or tofu, and low-fat dairy. Your health care provider will help you determine the amount of weight gain that is right for you.  Avoid raw meat and uncooked cheese. These carry germs that can cause birth defects in the baby.  If you have low calcium intake from food, talk to your health care provider about whether you should take a daily calcium supplement.  Eat four or five small meals rather than three large meals a day.  Limit foods that are high in fat and processed sugars, such as fried and sweet foods.  To prevent constipation: ? Drink enough fluid to keep your urine clear or pale yellow. ? Eat foods that are high in fiber, such as fresh fruits and vegetables, whole grains, and beans. Activity  Exercise only as directed by your health care provider. Most women can continue their usual exercise routine during pregnancy. Try to exercise for 30 minutes at least 5 days a week. Stop exercising if you experience uterine contractions.  Avoid heavy   lifting.  Do not exercise in extreme heat or humidity, or at high altitudes.  Wear low-heel, comfortable shoes.  Practice good posture.  You may continue to have sex unless your health care provider tells you otherwise. Relieving pain and discomfort  Take frequent breaks and rest with your legs elevated if you have leg cramps or low back pain.  Take warm sitz baths to soothe any pain or discomfort caused by hemorrhoids. Use hemorrhoid cream if your health care provider approves.  Wear a good support bra to prevent discomfort from breast tenderness.  If you develop varicose veins: ? Wear support pantyhose or compression stockings as told by your healthcare provider. ? Elevate your feet for 15 minutes, 3-4 times a day. Prenatal care  Write down your questions. Take them to your prenatal visits.  Keep all your prenatal visits as told by your health care provider. This is important. Safety  Wear your seat belt at  all times when driving.  Make a list of emergency phone numbers, including numbers for family, friends, the hospital, and police and fire departments. General instructions  Avoid cat litter boxes and soil used by cats. These carry germs that can cause birth defects in the baby. If you have a cat, ask someone to clean the litter box for you.  Do not travel far distances unless it is absolutely necessary and only with the approval of your health care provider.  Do not use hot tubs, steam rooms, or saunas.  Do not drink alcohol.  Do not use any products that contain nicotine or tobacco, such as cigarettes and e-cigarettes. If you need help quitting, ask your health care provider.  Do not use any medicinal herbs or unprescribed drugs. These chemicals affect the formation and growth of the baby.  Do not douche or use tampons or scented sanitary pads.  Do not cross your legs for long periods of time.  To prepare for the arrival of your baby: ? Take prenatal classes to understand, practice, and ask questions about labor and delivery. ? Make a trial run to the hospital. ? Visit the hospital and tour the maternity area. ? Arrange for maternity or paternity leave through employers. ? Arrange for family and friends to take care of pets while you are in the hospital. ? Purchase a rear-facing car seat and make sure you know how to install it in your car. ? Pack your hospital bag. ? Prepare the baby's nursery. Make sure to remove all pillows and stuffed animals from the baby's crib to prevent suffocation.  Visit your dentist if you have not gone during your pregnancy. Use a soft toothbrush to brush your teeth and be gentle when you floss. Contact a health care provider if:  You are unsure if you are in labor or if your water has broken.  You become dizzy.  You have mild pelvic cramps, pelvic pressure, or nagging pain in your abdominal area.  You have lower back pain.  You have persistent  nausea, vomiting, or diarrhea.  You have an unusual or bad smelling vaginal discharge.  You have pain when you urinate. Get help right away if:  Your water breaks before 37 weeks.  You have regular contractions less than 5 minutes apart before 37 weeks.  You have a fever.  You are leaking fluid from your vagina.  You have spotting or bleeding from your vagina.  You have severe abdominal pain or cramping.  You have rapid weight loss or weight gain.    You have shortness of breath with chest pain.  You notice sudden or extreme swelling of your face, hands, ankles, feet, or legs.  Your baby makes fewer than 10 movements in 2 hours.  You have severe headaches that do not go away when you take medicine.  You have vision changes. Summary  The third trimester is from week 28 through week 40, months 7 through 9. The third trimester is a time when the unborn baby (fetus) is growing rapidly.  During the third trimester, your discomfort may increase as you and your baby continue to gain weight. You may have abdominal, leg, and back pain, sleeping problems, and an increased need to urinate.  During the third trimester your breasts will keep growing and they will continue to become tender. A yellow fluid (colostrum) may leak from your breasts. This is the first milk you are producing for your baby.  False labor is a condition in which you feel small, irregular tightenings of the muscles in the womb (contractions) that eventually go away. These are called Braxton Hicks contractions. Contractions may last for hours, days, or even weeks before true labor sets in.  Signs of labor can include: abdominal cramps; regular contractions that start at 10 minutes apart and become stronger and more frequent with time; watery or bloody mucus discharge that comes from the vagina; increased pelvic pressure and dull back pain; and leaking of amniotic fluid. This information is not intended to replace advice  given to you by your health care provider. Make sure you discuss any questions you have with your health care provider. Document Released: 08/06/2001 Document Revised: 01/18/2016 Document Reviewed: 10/13/2012 Elsevier Interactive Patient Education  2017 [employer]  

## 2016-12-31 ENCOUNTER — Ambulatory Visit (INDEPENDENT_AMBULATORY_CARE_PROVIDER_SITE_OTHER): Payer: 59 | Admitting: Obstetrics and Gynecology

## 2016-12-31 VITALS — BP 136/76 | Wt 155.0 lb

## 2016-12-31 DIAGNOSIS — Z113 Encounter for screening for infections with a predominantly sexual mode of transmission: Secondary | ICD-10-CM

## 2016-12-31 DIAGNOSIS — Z3685 Encounter for antenatal screening for Streptococcus B: Secondary | ICD-10-CM

## 2016-12-31 DIAGNOSIS — Z3A36 36 weeks gestation of pregnancy: Secondary | ICD-10-CM

## 2016-12-31 DIAGNOSIS — Z3400 Encounter for supervision of normal first pregnancy, unspecified trimester: Secondary | ICD-10-CM

## 2016-12-31 NOTE — Progress Notes (Signed)
GBS today

## 2017-01-03 LAB — GC/CHLAMYDIA PROBE AMP
Chlamydia trachomatis, NAA: NEGATIVE
Neisseria gonorrhoeae by PCR: NEGATIVE

## 2017-01-03 LAB — STREP GP B NAA: Strep Gp B NAA: NEGATIVE

## 2017-01-10 ENCOUNTER — Ambulatory Visit (INDEPENDENT_AMBULATORY_CARE_PROVIDER_SITE_OTHER): Payer: 59 | Admitting: Obstetrics and Gynecology

## 2017-01-10 VITALS — BP 110/72 | Wt 156.0 lb

## 2017-01-10 DIAGNOSIS — Z3400 Encounter for supervision of normal first pregnancy, unspecified trimester: Secondary | ICD-10-CM

## 2017-01-10 DIAGNOSIS — Z3A37 37 weeks gestation of pregnancy: Secondary | ICD-10-CM

## 2017-01-10 NOTE — Progress Notes (Signed)
No vb. No lof.  

## 2017-01-17 ENCOUNTER — Ambulatory Visit (INDEPENDENT_AMBULATORY_CARE_PROVIDER_SITE_OTHER): Payer: 59 | Admitting: Obstetrics & Gynecology

## 2017-01-17 VITALS — BP 110/70 | Wt 154.0 lb

## 2017-01-17 DIAGNOSIS — Z3A38 38 weeks gestation of pregnancy: Secondary | ICD-10-CM

## 2017-01-17 DIAGNOSIS — Z3400 Encounter for supervision of normal first pregnancy, unspecified trimester: Secondary | ICD-10-CM

## 2017-01-17 NOTE — Progress Notes (Signed)
PNV, FMC, Labor precautions, circumcision discussed, does not want CCCB

## 2017-01-18 ENCOUNTER — Observation Stay
Admission: EM | Admit: 2017-01-18 | Discharge: 2017-01-18 | Disposition: A | Discharge status: Home / Self Care | Payer: 59 | Attending: Obstetrics and Gynecology | Admitting: Obstetrics and Gynecology

## 2017-01-18 DIAGNOSIS — R011 Cardiac murmur, unspecified: Secondary | ICD-10-CM | POA: Diagnosis not present

## 2017-01-18 DIAGNOSIS — O99343 Other mental disorders complicating pregnancy, third trimester: Secondary | ICD-10-CM | POA: Diagnosis not present

## 2017-01-18 DIAGNOSIS — F329 Major depressive disorder, single episode, unspecified: Secondary | ICD-10-CM | POA: Diagnosis not present

## 2017-01-18 DIAGNOSIS — O9989 Other specified diseases and conditions complicating pregnancy, childbirth and the puerperium: Secondary | ICD-10-CM | POA: Insufficient documentation

## 2017-01-18 DIAGNOSIS — Z3A38 38 weeks gestation of pregnancy: Secondary | ICD-10-CM | POA: Diagnosis not present

## 2017-01-18 DIAGNOSIS — O368130 Decreased fetal movements, third trimester, not applicable or unspecified: Principal | ICD-10-CM | POA: Insufficient documentation

## 2017-01-18 DIAGNOSIS — O471 False labor at or after 37 completed weeks of gestation: Secondary | ICD-10-CM | POA: Diagnosis not present

## 2017-01-18 DIAGNOSIS — D573 Sickle-cell trait: Secondary | ICD-10-CM | POA: Insufficient documentation

## 2017-01-18 DIAGNOSIS — Z91010 Allergy to peanuts: Secondary | ICD-10-CM | POA: Diagnosis not present

## 2017-01-18 DIAGNOSIS — O99013 Anemia complicating pregnancy, third trimester: Secondary | ICD-10-CM | POA: Diagnosis not present

## 2017-01-18 NOTE — OB Triage Note (Signed)
Patient presents to traige with complaints of decreased fetal movement x4 hours after eatin drinking , laying of left side in quiet room and concentrating on kick counts. No fluid leakage, no bleeding.

## 2017-01-18 NOTE — Discharge Instructions (Signed)
Discharge instructions given, all questions answered.

## 2017-01-18 NOTE — Discharge Summary (Signed)
Physician Final Progress Note  Patient ID: Mendel RyderShawianna Morss MRN: [id number] DOB/AGE: 1996-03-06 21 y.o.  Admit date: 01/18/2017 Admitting provider: Tresea MallJane Bryant Lipps, CNM Discharge date: 01/18/2017   Admission Diagnoses: Decreased fetal movement  Discharge Diagnoses:  Active Problems:   Decreased fetal movement affecting management of pregnancy in third trimester IUP at 3552w6d with reactive NST, positive fetal movement, occasional contractions  History of Present Illness: The patient is a 21 y.o. female G1P0 at 3152w6d who presents for a period of 4 hours today of not feeling baby move. She had something to drink and laid down on her left side and still did not feel the baby move. She has been having occasional cramping. She was not dilated when she was checked in the office yesterday. She denies LOF, VB.   Past Medical History:  Diagnosis Date  . Depression   . Heart murmur   . Sickle cell trait [facility](HCC)     Past Surgical History:  Procedure Laterality Date  . NO PAST SURGERIES      No current facility-administered medications on file prior to encounter.    Current Outpatient Prescriptions on File Prior to Encounter  Medication Sig Dispense Refill  . Multiple Vitamin (MULTIVITAMIN WITH MINERALS) TABS tablet Take 1 tablet by mouth daily.      Allergies  Allergen Reactions  . Peanuts [Peanut Oil] Anaphylaxis, Hives and Swelling    Tree nuts also     Social History   Social History  . Marital status: Single    Spouse name: N/A  . Number of children: N/A  . Years of education: N/A   Occupational History  . Not on file.   Social History Main Topics  . Smoking status: Never Smoker  . Smokeless tobacco: Never Used  . Alcohol use No  . Drug use: No  . Sexual activity: Yes   Other Topics Concern  . Not on file   Social History Narrative  . No narrative on file    Physical Exam: LMP 04/21/2016   Gen: NAD CV: RRR Pulm: CTAB Pelvic: deferred Toco: occasional  contraction Fetal Well Being: 140 bpm, moderate variability, +accelerations, -decelerations Ext: no edema  Consults: None  Significant Findings/ Diagnostic Studies: none  Procedures: NST  Discharge Condition: good  Disposition: 01-Home or Self Care  Diet: Regular diet  Discharge Activity: Activity as tolerated   Allergies as of 01/18/2017      Reactions   Peanuts [peanut Oil] Anaphylaxis, Hives, Swelling   Tree nuts also       Medication List    TAKE these medications   multivitamin with minerals Tabs tablet Take 1 tablet by mouth daily.      Follow-up Information    [facility].   Why:  go to regular scheduled prenatal appointment Contact information: [address] [place] [phone]          Total time spent taking care of this patient: 15 minutes  Signed: Tresea MallJane Janson Lamar, CNM  01/18/2017, 7:11 PM

## 2017-01-22 ENCOUNTER — Encounter: Payer: Self-pay | Admitting: Obstetrics & Gynecology

## 2017-01-23 ENCOUNTER — Observation Stay
Admission: EM | Admit: 2017-01-23 | Discharge: 2017-01-23 | Disposition: A | Discharge status: Home / Self Care | Payer: 59 | Attending: Obstetrics and Gynecology | Admitting: Obstetrics and Gynecology

## 2017-01-23 DIAGNOSIS — Z3A00 Weeks of gestation of pregnancy not specified: Secondary | ICD-10-CM | POA: Insufficient documentation

## 2017-01-23 DIAGNOSIS — O479 False labor, unspecified: Principal | ICD-10-CM | POA: Insufficient documentation

## 2017-01-23 DIAGNOSIS — Z3490 Encounter for supervision of normal pregnancy, unspecified, unspecified trimester: Secondary | ICD-10-CM

## 2017-01-23 NOTE — Final Progress Note (Signed)
Patient presented for evaluation of labor.  Patient had cervical exam by RN and this was reported to me. I reviewed her vital signs and fetal tracing, both of which were reassuring.  Patient was discharge as she was not laboring.  Diagnosis is false labor.  Thomasene MohairStephen Jackson, MD 01/23/2017 11:25 PM

## 2017-01-23 NOTE — Discharge Summary (Signed)
See Final Progress Note 

## 2017-01-23 NOTE — Discharge Instructions (Signed)
Discharge instructions given all questions answered. Follow up care reviewed.

## 2017-01-25 ENCOUNTER — Encounter: Payer: Self-pay | Admitting: *Deleted

## 2017-01-25 ENCOUNTER — Observation Stay
Admission: EM | Admit: 2017-01-25 | Discharge: 2017-01-25 | Disposition: A | Discharge status: Home / Self Care | Payer: 59 | Attending: Obstetrics and Gynecology | Admitting: Obstetrics and Gynecology

## 2017-01-25 DIAGNOSIS — Z91018 Allergy to other foods: Secondary | ICD-10-CM | POA: Insufficient documentation

## 2017-01-25 DIAGNOSIS — O471 False labor at or after 37 completed weeks of gestation: Principal | ICD-10-CM | POA: Insufficient documentation

## 2017-01-25 DIAGNOSIS — Z3A39 39 weeks gestation of pregnancy: Secondary | ICD-10-CM | POA: Insufficient documentation

## 2017-01-25 DIAGNOSIS — Z91010 Allergy to peanuts: Secondary | ICD-10-CM | POA: Insufficient documentation

## 2017-01-25 DIAGNOSIS — O479 False labor, unspecified: Secondary | ICD-10-CM | POA: Diagnosis not present

## 2017-01-25 MED ORDER — ZOLPIDEM TARTRATE 5 MG PO TABS: 5.0000 mg | ORAL_TABLET | Freq: Every evening | ORAL | 1 refills | Status: DC | PRN

## 2017-01-25 MED ORDER — ACETAMINOPHEN 325 MG PO TABS: 650.0000 mg | ORAL_TABLET | ORAL | Status: DC | PRN

## 2017-01-25 NOTE — Final Progress Note (Signed)
Physician Final Progress Note  Patient ID: [name] MRN: [id number] DOB/AGE: [birth year] 20 y.o.  Admit date: 01/25/2017 Admitting provider: Vena Austria, MD Discharge date: 01/25/2017   Admission Diagnoses: [name] Contractions  Discharge Diagnoses:  Active Problems:   Labor and delivery indication for care or intervention  21 yo G1 at [redacted]w[redacted]d presenting for contractions.  Cervix 1/70/-3.  +FM, no LOF, no VB.  Consults: None  Significant Findings/ Diagnostic Studies: none  Procedures:  Baseline: 135 Variability: moderate Accelerations: present Decelerations: absent Tocometry: 3 contractions over 40 minutes of monitoring The patient was monitored for 30 minutes, fetal heart rate tracing was deemed reactive, category I tracing,   Discharge Condition: good  Disposition: 01-Home or Self Care  Diet: Regular diet  Discharge Activity: Activity as tolerated  Discharge Instructions    Discharge activity:  No Restrictions    Complete by:  As directed    Discharge diet:  No restrictions    Complete by:  As directed    Fetal Kick Count:  Lie on our left side for one hour after a meal, and count the number of times your baby kicks.  If it is less than 5 times, get up, move around and drink some juice.  Repeat the test 30 minutes later.  If it is still less than 5 kicks in an hour, notify your doctor.    Complete by:  As directed    LABOR:  When conractions begin, you should start to time them from the beginning of one contraction to the beginning  of the next.  When contractions are 5 - 10 minutes apart or less and have been regular for at least an hour, you should call your health care provider.    Complete by:  As directed    No sexual activity restrictions    Complete by:  As directed    Notify physician for bleeding from the vagina    Complete by:  As directed    Notify physician for blurring of vision or spots before the eyes    Complete by:  As directed    Notify physician for chills or fever    Complete by:  As directed    Notify physician for fainting spells, "black outs" or loss of consciousness    Complete by:  As directed    Notify physician for increase in vaginal discharge    Complete by:  As directed    Notify physician for leaking of fluid    Complete by:  As directed    Notify physician for pain or burning when urinating    Complete by:  As directed    Notify physician for pelvic pressure (sudden increase)    Complete by:  As directed    Notify physician for severe or continued nausea or vomiting    Complete by:  As directed    Notify physician for sudden gushing of fluid from the vagina (with or without continued leaking)    Complete by:  As directed    Notify physician for sudden, constant, or occasional abdominal pain    Complete by:  As directed    Notify physician if baby moving less than usual    Complete by:  As directed      Allergies as of 01/25/2017      Reactions   Peanuts [peanut Oil] Anaphylaxis, Hives, Swelling   Tree nuts also       Medication List    TAKE these medications   multivitamin  with minerals Tabs tablet Take 1 tablet by mouth daily.        Total time spent taking care of this patient: 15 minutes  Signed: Vena Austriandreas Holle Sprick 01/25/2017, 9:27 PM

## 2017-01-25 NOTE — Discharge Summary (Signed)
See final progress note. 

## 2017-01-25 NOTE — OB Triage Note (Signed)
Ctx since 1600 today. Reports ctx every 5 minutes. Reports good fetal movement. Denies any LOF. Denies bleeding. Elaina HoopsElks, Laylah Riga S

## 2017-01-26 ENCOUNTER — Observation Stay
Admission: EM | Admit: 2017-01-26 | Discharge: 2017-01-26 | Disposition: A | Discharge status: Home / Self Care | Payer: 59 | Attending: Obstetrics and Gynecology | Admitting: Obstetrics and Gynecology

## 2017-01-26 DIAGNOSIS — Z3A40 40 weeks gestation of pregnancy: Secondary | ICD-10-CM | POA: Insufficient documentation

## 2017-01-26 DIAGNOSIS — O480 Post-term pregnancy: Secondary | ICD-10-CM | POA: Diagnosis not present

## 2017-01-26 DIAGNOSIS — O471 False labor at or after 37 completed weeks of gestation: Principal | ICD-10-CM | POA: Insufficient documentation

## 2017-01-26 DIAGNOSIS — O479 False labor, unspecified: Secondary | ICD-10-CM | POA: Diagnosis not present

## 2017-01-26 MED ORDER — ACETAMINOPHEN 325 MG PO TABS: 650.0000 mg | ORAL_TABLET | ORAL | Status: DC | PRN

## 2017-01-26 NOTE — Final Progress Note (Signed)
Physician Final Progress Note  Patient ID: [name] MRN: [id number] DOB/AGE: [birth year] 21 y.o.  Admit date: 01/26/2017 Admitting provider: Vena Austria, MD Discharge date: 01/26/2017   Admission Diagnoses: Braxton-Hick contractions  Discharge Diagnoses:  Active Problems:   Labor and delivery indication for care or intervention  21 yo G1P0 at [redacted]w[redacted]d second presentation in past 24-hrs for contractions.  Monitored over 5-hrs without any cervical change (no cervical change over 9-hrs counting first presentation the prior evening).  Contractions slightly more regular but still spaced apart.  +FM, no LOF, no VB  Consults: None  Significant Findings/ Diagnostic Studies: none  Procedures:  Baseline: 130 Variability: moderate Accelerations: present Decelerations: absent Tocometry: irregular, every 5-8 minutes The patient was monitored for 30 minutes, fetal heart rate tracing was deemed reactive, category I tracing,   Discharge Condition: good  Disposition: 01-Home or Self Care  Diet: Regular diet  Discharge Activity: Activity as tolerated  Discharge Instructions    Discharge activity:  No Restrictions    Complete by:  As directed    Discharge diet:  No restrictions    Complete by:  As directed    Fetal Kick Count:  Lie on our left side for one hour after a meal, and count the number of times your baby kicks.  If it is less than 5 times, get up, move around and drink some juice.  Repeat the test 30 minutes later.  If it is still less than 5 kicks in an hour, notify your doctor.    Complete by:  As directed    LABOR:  When conractions begin, you should start to time them from the beginning of one contraction to the beginning  of the next.  When contractions are 5 - 10 minutes apart or less and have been regular for at least an hour, you should call your health care provider.    Complete by:  As directed    No sexual activity restrictions    Complete by:  As directed    Notify physician for bleeding from the vagina    Complete by:  As directed    Notify physician for blurring of vision or spots before the eyes    Complete by:  As directed    Notify physician for chills or fever    Complete by:  As directed    Notify physician for fainting spells, "black outs" or loss of consciousness    Complete by:  As directed    Notify physician for increase in vaginal discharge    Complete by:  As directed    Notify physician for leaking of fluid    Complete by:  As directed    Notify physician for pain or burning when urinating    Complete by:  As directed    Notify physician for pelvic pressure (sudden increase)    Complete by:  As directed    Notify physician for severe or continued nausea or vomiting    Complete by:  As directed    Notify physician for sudden gushing of fluid from the vagina (with or without continued leaking)    Complete by:  As directed    Notify physician for sudden, constant, or occasional abdominal pain    Complete by:  As directed    Notify physician if baby moving less than usual    Complete by:  As directed      Allergies as of 01/26/2017      Reactions   Peanuts [peanut Oil]  Anaphylaxis, Hives, Swelling   Tree nuts also       Medication List    TAKE these medications   multivitamin with minerals Tabs tablet Take 1 tablet by mouth daily.   zolpidem 5 MG tablet Commonly known as:  AMBIEN Take 1 tablet (5 mg total) by mouth at bedtime as needed for sleep.        Total time spent taking care of this patient: 30 minutes  Signed: Vena Austriandreas Tarren Velardi 01/26/2017, 8:11 AM

## 2017-01-26 NOTE — Discharge Summary (Signed)
See final progress note. 

## 2017-01-27 ENCOUNTER — Ambulatory Visit (INDEPENDENT_AMBULATORY_CARE_PROVIDER_SITE_OTHER): Payer: 59 | Admitting: Certified Nurse Midwife

## 2017-01-27 VITALS — BP 110/60 | Wt 156.0 lb

## 2017-01-27 DIAGNOSIS — Z3A40 40 weeks gestation of pregnancy: Secondary | ICD-10-CM

## 2017-01-27 DIAGNOSIS — Z3403 Encounter for supervision of normal first pregnancy, third trimester: Secondary | ICD-10-CM

## 2017-01-27 NOTE — Progress Notes (Signed)
Has been to L&D twice over the weekend and cervix remained 1 cm dilated. Baby active Pain relief in labor discussed  Scheduled for for IOL at 41 weeks (6/10) -probably Cytotec/foley DIscussed induction procedure and risks of hyperstimulation and FITL.  Labor precautions

## 2017-01-27 NOTE — Progress Notes (Signed)
Pt went to L&D twice over weekend. C/o ctx q3-4 mins.

## 2017-01-29 ENCOUNTER — Encounter: Payer: Self-pay | Admitting: Certified Nurse Midwife

## 2017-01-29 NOTE — Addendum Note (Signed)
Addended by: Farrel ConnersGUTIERREZ, Glynda Soliday on: 01/29/2017 09:47 PM   Modules accepted: Orders, SmartSet

## 2017-01-29 NOTE — H&P (Signed)
OB History & Physical   History of Present Illness:  Chief Complaint:  "I am here to have my baby. I am past my due date." HPI:  [name] is a 21 y.o. G1P0 female with EDC 01/26/2017 at [redacted]w[redacted]d dated by LMP 04/21/2017.  Her pregnancy has been complicated by sickle cell trait and a family history of several people with congenital heart  anomalies.. She does have a history of depression and anxiety. She is not taking any medication currently.   She presents to L&D for induction of labor due to postdates. Baby active. Has been having cramping and irregular contractions. No vaginal bleeding or leakage of fluid. Total weight gain this pregnancy is 31#  Prenatal care site: Prenatal care at [facility]. Will be breast feeding. Pills for contraception. TDAP 11/19/2016      Maternal Medical History:   Past Medical History:  Diagnosis Date  . Depression   . Heart murmur   . Sickle cell trait [facility])     Past Surgical History:  Procedure Laterality Date  . NO PAST SURGERIES      Allergies  Allergen Reactions  . Peanuts [Peanut Oil] Anaphylaxis, Hives and Swelling    Tree nuts also     Prior to Admission medications   Medication Sig Start Date End Date Taking? Authorizing Provider  Multiple Vitamin (MULTIVITAMIN WITH MINERALS) TABS tablet Take 1 tablet by mouth daily.    [provider]  zolpidem (AMBIEN) 5 MG tablet Take 1 tablet (5 mg total) by mouth at bedtime as needed for sleep. 01/25/17   Vena Austria, MD          Social History: She  reports that she has never smoked. She has never used smokeless tobacco. She reports that she does not drink alcohol or use drugs.  Family History: family history includes Hypertension in her maternal grandmother.   Review of Systems: Negative x 10 systems reviewed except as noted in the HPI.      Physical Exam:  Vital Signs: BP 110/60   Wt 70.8 kg (156 lb)   LMP 04/21/2016   BMI 22.38 kg/m  General: no acute distress.   HEENT: normocephalic, atraumatic Heart: regular rate & rhythm.  No murmurs/rubs/gallops Lungs: clear to auscultation bilaterally Abdomen: soft, gravid, non-tender;  EFW: 7 1/2 #. FH 37 cm Pelvic:   External: Normal external female genitalia  Cervix: Dilation: 1 / Effacement (%): 50 / Station: -1 / vertex  Extremities: non-tender, symmetric, no edema bilaterally.  DTRs: +1 to +2  Neurologic: Alert & oriented x 3.    Pertinent Results:  Prenatal Labs: Blood type/Rh AB positive  Antibody screen negative  Rubella Varicella Immune immune  RPR Non reactive  HBsAg negative  HIV Non reactive  GC negative  Chlamydia negative  Genetic screening Negative first trimester test  1 hour GTT 84  3 hour GTT NA  GBS negative on 01/01/2017  SS testing-AS hemoglobin Platelet count at 28 weeks 134K Baseline FHR: 139   Assessment:  [name] is a 21 y.o. G1P0 female with EDC=01/26/2017 admitted for IOL for postdates  Bishop score 4 Plan:  1. Admit to Labor & Delivery 6/10 for IOL - Discussed risks of continued observation vs risks of induction and patient wishes to proceed. Most likely will need Cytotec ripening +/- a foley bulb.  2. CBC, T&S, Clrs, IVF 3. GBS negative.   4. Consents obtained. 5. AB POS/ RI/ VI/ 6. Breast/ pills 7. TDAP UTD   Farrel Conners  01/29/2017 9:33 PM

## 2017-02-02 ENCOUNTER — Inpatient Hospital Stay
Admission: EM | Admit: 2017-02-02 | Discharge: 2017-02-04 | DRG: 775 | Disposition: A | Discharge status: Home / Self Care | Payer: 59 | Attending: Obstetrics and Gynecology | Admitting: Obstetrics and Gynecology

## 2017-02-02 DIAGNOSIS — O9081 Anemia of the puerperium: Secondary | ICD-10-CM | POA: Diagnosis not present

## 2017-02-02 DIAGNOSIS — D62 Acute posthemorrhagic anemia: Secondary | ICD-10-CM | POA: Diagnosis not present

## 2017-02-02 DIAGNOSIS — O480 Post-term pregnancy: Principal | ICD-10-CM | POA: Diagnosis present

## 2017-02-02 DIAGNOSIS — Z3A41 41 weeks gestation of pregnancy: Secondary | ICD-10-CM | POA: Diagnosis not present

## 2017-02-02 DIAGNOSIS — Z3400 Encounter for supervision of normal first pregnancy, unspecified trimester: Secondary | ICD-10-CM

## 2017-02-02 LAB — TYPE AND SCREEN
ABO/RH(D): AB POS
Antibody Screen: NEGATIVE

## 2017-02-02 LAB — CBC
HEMATOCRIT: 33.2 % — AB (ref 35.0–47.0)
Hemoglobin: 11.6 g/dL — ABNORMAL LOW (ref 12.0–16.0)
MCH: 28.9 pg (ref 26.0–34.0)
MCHC: 34.9 g/dL (ref 32.0–36.0)
MCV: 83 fL (ref 80.0–100.0)
PLATELETS: 95 10*3/uL — AB (ref 150–440)
RBC: 4 MIL/uL (ref 3.80–5.20)
RDW: 13.6 % (ref 11.5–14.5)
WBC: 8.3 10*3/uL (ref 3.6–11.0)

## 2017-02-02 MED ORDER — OXYTOCIN 40 UNITS IN LACTATED RINGERS INFUSION - SIMPLE MED
1.0000 m[IU]/min | INTRAVENOUS | Status: DC
  Administered 2017-02-02: 1 m[IU]/min via INTRAVENOUS
  Filled 2017-02-02: qty 1000

## 2017-02-02 MED ORDER — HYDROMORPHONE HCL 1 MG/ML IJ SOLN
INTRAMUSCULAR | Status: AC
  Administered 2017-02-02: 0.5 mg via INTRAVENOUS
  Filled 2017-02-02: qty 0.5

## 2017-02-02 MED ORDER — TERBUTALINE SULFATE 1 MG/ML IJ SOLN: 0.2500 mg | Freq: Once | INTRAMUSCULAR | Status: DC | PRN

## 2017-02-02 MED ORDER — LACTATED RINGERS IV SOLN: 500.0000 mL | INTRAVENOUS | Status: DC | PRN

## 2017-02-02 MED ORDER — LIDOCAINE HCL (PF) 1 % IJ SOLN
INTRAMUSCULAR | Status: AC
  Filled 2017-02-02: qty 30

## 2017-02-02 MED ORDER — AMMONIA AROMATIC IN INHA
RESPIRATORY_TRACT | Status: AC
  Filled 2017-02-02: qty 10

## 2017-02-02 MED ORDER — FENTANYL CITRATE (PF) 100 MCG/2ML IJ SOLN
75.0000 ug | Freq: Once | INTRAMUSCULAR | Status: AC
  Administered 2017-02-02: 75 ug via INTRAVENOUS

## 2017-02-02 MED ORDER — LIDOCAINE HCL (PF) 1 % IJ SOLN
30.0000 mL | INTRAMUSCULAR | Status: DC | PRN
  Administered 2017-02-03: 30 mL via SUBCUTANEOUS

## 2017-02-02 MED ORDER — OXYTOCIN 40 UNITS IN LACTATED RINGERS INFUSION - SIMPLE MED: 2.5000 [IU]/h | INTRAVENOUS | Status: DC

## 2017-02-02 MED ORDER — OXYTOCIN 10 UNIT/ML IJ SOLN
INTRAMUSCULAR | Status: AC
  Administered 2017-02-02: 20 [IU] via INTRAMUSCULAR
  Filled 2017-02-02: qty 2

## 2017-02-02 MED ORDER — MISOPROSTOL 200 MCG PO TABS
ORAL_TABLET | ORAL | Status: AC
  Filled 2017-02-02: qty 4

## 2017-02-02 MED ORDER — FENTANYL CITRATE (PF) 100 MCG/2ML IJ SOLN
INTRAMUSCULAR | Status: AC
  Filled 2017-02-02: qty 2

## 2017-02-02 MED ORDER — HYDROMORPHONE HCL 1 MG/ML IJ SOLN
0.5000 mg | INTRAMUSCULAR | Status: DC | PRN
  Administered 2017-02-02 (×2): 0.5 mg via INTRAVENOUS
  Filled 2017-02-02: qty 0.5

## 2017-02-02 MED ORDER — ONDANSETRON HCL 4 MG/2ML IJ SOLN: 4.0000 mg | Freq: Four times a day (QID) | INTRAMUSCULAR | Status: DC | PRN

## 2017-02-02 MED ORDER — OXYTOCIN BOLUS FROM INFUSION
500.0000 mL | Freq: Once | INTRAVENOUS | Status: AC
  Administered 2017-02-02: 500 mL via INTRAVENOUS

## 2017-02-02 MED ORDER — LACTATED RINGERS IV SOLN: INTRAVENOUS | Status: DC

## 2017-02-02 MED ORDER — MISOPROSTOL 25 MCG QUARTER TABLET: 25.0000 ug | ORAL_TABLET | ORAL | Status: DC | PRN

## 2017-02-02 NOTE — H&P (Signed)
OB History and Physical Update  See previously document H&P: Below are pertinent updates. Patient presents for induction of labor due to post dates.  See previously documented H&P for full details.   Pertinently, the patient presents today with strong contractions since 3am today, about every 2 minutes.  She notes +FM, no LOF, no VB.    Subjective: BP 130/83   Pulse 68   Temp 98.8 F (37.1 C) (Oral)   Ht 5\' 10"  (1.778 m)   Wt 156 lb (70.8 kg)   LMP 04/21/2016   BMI 22.38 kg/m   Gen: NAD Pulm: no respiratory distress CV: RRR GU: cvx 3cm/70% per RN Ext:no e/c/t  Fetus:  130/moderate/+accels/no decels Toco: 5 q 10 min  A/P: 21 y.o. G1P0 female at 11027w0d here for IOL with possible labor Plan  Allow to see if labor progresses naturally. If needs agumentation, will give pitocin.  GBS negative 01/01/17 (within 5 weeks) Fetal status is reassuring with category 1 tracing. Platelets 95k.  Monitor. Discussed that she may not be able to get epidural.  Decision pending anesthesia eval.  Discussed alternatives.  Thomasene MohairStephen Shermika Balthaser, MD 02/02/2017 7:50 AM

## 2017-02-03 DIAGNOSIS — O480 Post-term pregnancy: Secondary | ICD-10-CM

## 2017-02-03 DIAGNOSIS — Z3A41 41 weeks gestation of pregnancy: Secondary | ICD-10-CM

## 2017-02-03 LAB — CBC
HCT: 30.3 % — ABNORMAL LOW (ref 35.0–47.0)
HEMOGLOBIN: 10.3 g/dL — AB (ref 12.0–16.0)
MCH: 27.9 pg (ref 26.0–34.0)
MCHC: 34.1 g/dL (ref 32.0–36.0)
MCV: 81.8 fL (ref 80.0–100.0)
PLATELETS: 96 10*3/uL — AB (ref 150–440)
RBC: 3.71 MIL/uL — ABNORMAL LOW (ref 3.80–5.20)
RDW: 13.3 % (ref 11.5–14.5)
WBC: 13.8 10*3/uL — ABNORMAL HIGH (ref 3.6–11.0)

## 2017-02-03 LAB — RPR: RPR: NONREACTIVE

## 2017-02-03 MED ORDER — COCONUT OIL OIL
1.0000 "application " | TOPICAL_OIL | Status: DC | PRN
  Administered 2017-02-03: 1 via TOPICAL
  Filled 2017-02-03: qty 120

## 2017-02-03 MED ORDER — ADULT MULTIVITAMIN W/MINERALS CH: 1.0000 | ORAL_TABLET | Freq: Every day | ORAL | Status: DC

## 2017-02-03 MED ORDER — DIPHENHYDRAMINE HCL 25 MG PO CAPS: 25.0000 mg | ORAL_CAPSULE | Freq: Four times a day (QID) | ORAL | Status: DC | PRN

## 2017-02-03 MED ORDER — SENNOSIDES-DOCUSATE SODIUM 8.6-50 MG PO TABS
2.0000 | ORAL_TABLET | ORAL | Status: DC
  Administered 2017-02-04: 2 via ORAL
  Filled 2017-02-03: qty 2

## 2017-02-03 MED ORDER — HYDROCODONE-ACETAMINOPHEN 5-325 MG PO TABS: 1.0000 | ORAL_TABLET | Freq: Four times a day (QID) | ORAL | Status: DC | PRN

## 2017-02-03 MED ORDER — SIMETHICONE 80 MG PO CHEW: 80.0000 mg | CHEWABLE_TABLET | ORAL | Status: DC | PRN

## 2017-02-03 MED ORDER — PRENATAL MULTIVITAMIN CH
1.0000 | ORAL_TABLET | Freq: Every day | ORAL | Status: DC
  Administered 2017-02-03: 1 via ORAL

## 2017-02-03 MED ORDER — WITCH HAZEL-GLYCERIN EX PADS: 1.0000 "application " | MEDICATED_PAD | CUTANEOUS | Status: DC | PRN

## 2017-02-03 MED ORDER — DIBUCAINE 1 % RE OINT: 1.0000 "application " | TOPICAL_OINTMENT | RECTAL | Status: DC | PRN

## 2017-02-03 MED ORDER — FERROUS SULFATE 325 (65 FE) MG PO TABS
325.0000 mg | ORAL_TABLET | Freq: Two times a day (BID) | ORAL | Status: DC
  Administered 2017-02-03 – 2017-02-04 (×3): 325 mg via ORAL
  Filled 2017-02-03 (×3): qty 1

## 2017-02-03 MED ORDER — BENZOCAINE-MENTHOL 20-0.5 % EX AERO
1.0000 "application " | INHALATION_SPRAY | CUTANEOUS | Status: DC | PRN
  Administered 2017-02-03: 1 via TOPICAL
  Filled 2017-02-03 (×2): qty 56

## 2017-02-03 MED ORDER — ACETAMINOPHEN 325 MG PO TABS: 650.0000 mg | ORAL_TABLET | ORAL | Status: DC | PRN

## 2017-02-03 MED ORDER — IBUPROFEN 600 MG PO TABS
600.0000 mg | ORAL_TABLET | Freq: Four times a day (QID) | ORAL | Status: DC
  Administered 2017-02-03 – 2017-02-04 (×5): 600 mg via ORAL
  Filled 2017-02-03 (×6): qty 1

## 2017-02-03 MED ORDER — ONDANSETRON HCL 4 MG PO TABS: 4.0000 mg | ORAL_TABLET | ORAL | Status: DC | PRN

## 2017-02-03 MED ORDER — ONDANSETRON HCL 4 MG/2ML IJ SOLN: 4.0000 mg | INTRAMUSCULAR | Status: DC | PRN

## 2017-02-03 NOTE — Discharge Summary (Signed)
OB Discharge Summary     Patient Name: Gabriella RyderShawianna Martelle DOB: [birth year] MRN: [id number]  Date of admission: 02/02/2017 Delivering MD: Thomasene MohairStephen Jackson, MD  Date of Delivery: 02/03/2017  Date of discharge:02/04/2017 Admitting diagnosis: 4341 Weeks Induction Intrauterine pregnancy: 763w1d     Secondary diagnosis: None     Discharge diagnosis: Term Pregnancy Delivered                                                                                                Post partum procedures:none  Augmentation: Pitocin  Complications: None  Hospital course:  Induction of Labor With Vaginal Delivery   21 y.o. yo G1P0 at 563w1d was admitted to the hospital 02/02/2017 for induction of labor.  Indication for induction: Postdates.  Patient had an uncomplicated labor course as follows: Membrane Rupture Time/Date: 11:22 PM ,02/03/2017   Intrapartum Procedures: Episiotomy: None [1]                                         Lacerations:  2nd degree [3];Vaginal [6];Perineal [11]  Patient had delivery of a Viable infant.  Information for the patient's newborn:  Darnelle Catalanewman, Boy Tyquasia [[id number]][[id number]]  Delivery Method: Vag-Spont   02/02/2017  Details of delivery can be found in separate delivery note.  Patient had a routine postpartum course. Patient is discharged home 02/04/17.  Physical exam  Vitals:   02/03/17 0937 02/03/17 1143 02/03/17 2353 02/04/17 0747  BP:  (!) 117/59 126/72 123/89  Pulse:  77 80 86  Resp:  18 18 17   Temp: 99.4 F (37.4 C) 98 F (36.7 C) 98.9 F (37.2 C) 97.6 F (36.4 C)  TempSrc: Oral Oral Oral Oral  SpO2:  98% 100% 100%  Weight:      Height:       General: alert, cooperative and no distress Lochia: appropriate Uterine Fundus: firm/ U-2/ ML/ NT Incision: perineal laceration healing well, no hematoma seen DVT Evaluation: No evidence of DVT seen on physical exam.  Labs: Lab Results  Component Value Date   WBC 13.8 (H) 02/03/2017   HGB 10.3 (L) 02/03/2017   HCT 30.3 (L)  02/03/2017   MCV 81.8 02/03/2017   PLT 96 (L) 02/03/2017    Discharge instruction: per After Visit Summary.  Medications:  Allergies as of 02/04/2017      Reactions   Peanuts [peanut Oil] Anaphylaxis, Hives, Swelling   Tree nuts also       Medication List    STOP taking these medications   zolpidem 5 MG tablet Commonly known as:  AMBIEN     TAKE these medications   ibuprofen 600 MG tablet Commonly known as:  ADVIL,MOTRIN Take 1 tablet (600 mg total) by mouth every 6 (six) hours as needed.   multivitamin with minerals Tabs tablet Take 1 tablet by mouth daily.   norethindrone 0.35 MG tablet Commonly known as:  MICRONOR,CAMILA,ERRIN Take 1 tablet (0.35 mg total) by mouth daily. Start taking on:  03/02/2017       Diet: routine  diet  Activity: Advance as tolerated. Pelvic rest for 6 weeks.   Outpatient follow up: Follow-up Information    Conard Novak, MD Follow up in 2 week(s).   Specialty:  Obstetrics and Gynecology Why:  postpartum depression check Contact information: [address] [place] [address] [phone]             Postpartum contraception: Progesterone only pills Rhogam Given postpartum: no Rubella vaccine given postpartum: no Varicella vaccine given postpartum: no TDaP given antepartum or postpartum: AP  Newborn Data: Live born female / Noah Birth Weight: 7#7.9oz  APGAR: 8, 9   Baby Feeding: Breast  Disposition:home with mother  SIGNED: Farrel Conners, CNM

## 2017-02-03 NOTE — H&P (Signed)
  Subjective:  Doing well, minimal lochia, no fevers, no chills.  Objective:   Blood pressure 127/75, pulse 80, temperature 98.7 F (37.1 C), temperature source Oral, resp. rate 16, height 5\' 9"  (1.753 m), weight 155 lb (70.3 kg), last menstrual period 04/21/2016, SpO2 99 %.  General: NAD Pulmonary: no increased work of breathing Abdomen: non-distended, non-tender, fundus firm at level of umbilicus Extremities: no edema, no erythema, no tenderness  Results for orders placed or performed during the hospital encounter of 02/02/17 (from the past 72 hour(s))  CBC     Status: Abnormal   Collection Time: 02/02/17  7:02 AM  Result Value Ref Range   WBC 8.3 3.6 - 11.0 K/uL   RBC 4.00 3.80 - 5.20 MIL/uL   Hemoglobin 11.6 (L) 12.0 - 16.0 g/dL   HCT 16.133.2 (L) 09.635.0 - 04.547.0 %   MCV 83.0 80.0 - 100.0 fL   MCH 28.9 26.0 - 34.0 pg   MCHC 34.9 32.0 - 36.0 g/dL   RDW 40.913.6 81.111.5 - 91.414.5 %   Platelets 95 (L) 150 - 440 K/uL  RPR     Status: None   Collection Time: 02/02/17  7:02 AM  Result Value Ref Range   RPR Ser Ql Non Reactive Non Reactive    Comment: (NOTE) Performed At: [facility] [address] [place], [place] [id number] Mila HomerHancock William F MD [id number]:[phone]   Type and screen     Status: None   Collection Time: 02/02/17  7:02 AM  Result Value Ref Range   ABO/RH(D) AB POS    Antibody Screen NEG    Sample Expiration 02/05/2017   CBC     Status: Abnormal   Collection Time: 02/03/17  3:50 AM  Result Value Ref Range   WBC 13.8 (H) 3.6 - 11.0 K/uL   RBC 3.71 (L) 3.80 - 5.20 MIL/uL   Hemoglobin 10.3 (L) 12.0 - 16.0 g/dL   HCT 52.830.3 (L) 41.335.0 - 24.447.0 %   MCV 81.8 80.0 - 100.0 fL   MCH 27.9 26.0 - 34.0 pg   MCHC 34.1 32.0 - 36.0 g/dL   RDW 01.013.3 27.211.5 - 53.614.5 %   Platelets 96 (L) 150 - 440 K/uL    Assessment:   21 y.o. G1P0 postpartum day # 1 TSVD  Plan:    1) Acute blood loss anemia - hemodynamically stable and asymptomatic - po ferrous sulfate  2) Blood Type --/--/AB POS  (06/10 [id number]) / Rubella Immune (11/07 0000) / Varicella Immune  3) TDAP status - UTD  4) Breast/Micronor  5) Disposition - anticipate discharge PPD2

## 2017-02-04 LAB — CHLAMYDIA/NGC RT PCR (ARMC ONLY)
CHLAMYDIA TR: NOT DETECTED
N GONORRHOEAE: NOT DETECTED

## 2017-02-04 MED ORDER — IBUPROFEN 600 MG PO TABS: 600.0000 mg | ORAL_TABLET | Freq: Four times a day (QID) | ORAL | 1 refills | Status: DC | PRN

## 2017-02-04 MED ORDER — NORETHINDRONE 0.35 MG PO TABS: 1.0000 | ORAL_TABLET | Freq: Every day | ORAL | 11 refills | Status: DC

## 2017-02-04 NOTE — Progress Notes (Signed)
Discharge instructions complete and prescriptions given. Patient verbalizes understanding of teaching. Patient discharged home at 1445.

## 2017-02-04 NOTE — Progress Notes (Signed)
Discharge instructions reviewed with pt.  Verb u/o. 

## 2017-02-21 ENCOUNTER — Ambulatory Visit (INDEPENDENT_AMBULATORY_CARE_PROVIDER_SITE_OTHER): Payer: 59 | Admitting: Obstetrics and Gynecology

## 2017-02-21 NOTE — Progress Notes (Signed)
  Obstetrics & Gynecology Office Visit   Chief Complaint  Patient presents with  . Postpartum Care    2 week Follow Up    History of Present Illness: 21 y.o. 711P1001 female who is about 2 weeks postpartum from a vaginal delivery that was uncomplicated.  She is here for a postpartum depression screen.  She reports no symptoms of depression. EPDS=9 (0 score on #10)    Past Medical History:  Diagnosis Date  . Depression   . Heart murmur   . Sickle cell trait [facility](HCC)     Past Surgical History:  Procedure Laterality Date  . NO PAST SURGERIES      Gynecologic History: No LMP recorded.  Obstetric History: G1P1001  Family History  Problem Relation Age of Onset  . Hypertension Maternal Grandmother     Social History   Social History  . Marital status: Single    Spouse name: N/A  . Number of children: N/A  . Years of education: N/A   Occupational History  . Not on file.   Social History Main Topics  . Smoking status: Never Smoker  . Smokeless tobacco: Never Used  . Alcohol use No  . Drug use: No  . Sexual activity: Yes    Birth control/ protection: Pill   Other Topics Concern  . Not on file   Social History Narrative  . No narrative on file    Allergies  Allergen Reactions  . Peanuts [Peanut Oil] Anaphylaxis, Hives and Swelling    Tree nuts also     Prior to Admission medications   Medication Sig Start Date End Date Taking? Authorizing Provider  ibuprofen (ADVIL,MOTRIN) 600 MG tablet Take 1 tablet (600 mg total) by mouth every 6 (six) hours as needed. 02/04/17   Farrel ConnersGutierrez, Colleen, CNM  Multiple Vitamin (MULTIVITAMIN WITH MINERALS) TABS tablet Take 1 tablet by mouth daily.    [provider]  norethindrone (MICRONOR,CAMILA,ERRIN) 0.35 MG tablet Take 1 tablet (0.35 mg total) by mouth daily. 03/02/17   Farrel ConnersGutierrez, Colleen, CNM    Review of Systems  Constitutional: Negative.   HENT: Negative.   Eyes: Negative.   Respiratory: Negative.     Cardiovascular: Negative.   Gastrointestinal: Negative.   Genitourinary: Negative.   Musculoskeletal: Negative.   Skin: Negative.   Neurological: Negative.   Psychiatric/Behavioral: Negative.      Physical Exam BP 118/70   Ht 5\' 9"  (1.753 m)   Wt 144 lb (65.3 kg)   BMI 21.27 kg/m  No LMP recorded. Physical Exam  Constitutional: She is oriented to person, place, and time. She appears well-developed and well-nourished. No distress.  HENT:  Head: Normocephalic and atraumatic.  Eyes: EOM are normal. No scleral icterus.  Musculoskeletal: Normal range of motion. She exhibits no edema.  Neurological: She is alert and oriented to person, place, and time. No cranial nerve deficit.  Skin: Skin is warm and dry. No erythema.  Psychiatric: She has a normal mood and affect. Her behavior is normal. Judgment normal.   Assessment: 21 y.o. 641P1001 female here for postpartum depression screen. Doing well today.  Plan: Continue to monitor for symptoms.  If no concerns, return for 6 week postpartum visit.   Thomasene MohairStephen Nawaal Alling, MD 02/21/2017 9:25 AM

## 2017-03-14 ENCOUNTER — Ambulatory Visit: Payer: 59 | Admitting: Obstetrics and Gynecology

## 2017-03-28 ENCOUNTER — Ambulatory Visit (INDEPENDENT_AMBULATORY_CARE_PROVIDER_SITE_OTHER): Payer: 59 | Admitting: Obstetrics and Gynecology

## 2017-03-28 VITALS — BP 118/70 | Ht 70.0 in | Wt 138.0 lb

## 2017-03-28 DIAGNOSIS — Z124 Encounter for screening for malignant neoplasm of cervix: Secondary | ICD-10-CM

## 2017-03-28 DIAGNOSIS — Z113 Encounter for screening for infections with a predominantly sexual mode of transmission: Secondary | ICD-10-CM

## 2017-03-28 DIAGNOSIS — O99345 Other mental disorders complicating the puerperium: Secondary | ICD-10-CM

## 2017-03-28 DIAGNOSIS — Z3041 Encounter for surveillance of contraceptive pills: Secondary | ICD-10-CM

## 2017-03-28 DIAGNOSIS — F53 Postpartum depression: Secondary | ICD-10-CM

## 2017-03-28 MED ORDER — NORETHINDRONE 0.35 MG PO TABS: 1.0000 | ORAL_TABLET | Freq: Every day | ORAL | 3 refills | Status: DC

## 2017-03-28 MED ORDER — NORETHINDRONE 0.35 MG PO TABS: 1.0000 | ORAL_TABLET | Freq: Every day | ORAL | 4 refills | Status: DC

## 2017-03-28 NOTE — Progress Notes (Addendum)
Postpartum Visit   Chief Complaint  Patient presents with  . 6 week post partum visit   History of Present Illness: Patient is a 21 y.o. G1P1001 presents for postpartum visit.  Date of delivery: 02/03/2017 Type of delivery: Vaginal delivery - Vacuum or forceps assisted  no Episiotomy No.  Laceration: yes, second degree Pregnancy or labor problems:  no Any problems since the delivery:  no  Newborn Details:  SINGLETON :  1. Baby's name: . Birth weight: 7.9lb Maternal Details:  Breast Feeding:  yes Post partum depression/anxiety noted:  Yes. SEe below.  Edinburgh Post-Partum Depression Score:  19  Date of last PAP: n/a due to age  Past Medical History:  Diagnosis Date  . Depression   . Heart murmur   . Sickle cell trait [facility](HCC)     Past Surgical History:  Procedure Laterality Date  . NO PAST SURGERIES      Prior to Admission medications   Medication Sig Start Date End Date Taking? Authorizing Provider  ibuprofen (ADVIL,MOTRIN) 600 MG tablet Take 1 tablet (600 mg total) by mouth every 6 (six) hours as needed. 02/04/17   Farrel ConnersGutierrez, Colleen, CNM  Multiple Vitamin (MULTIVITAMIN WITH MINERALS) TABS tablet Take 1 tablet by mouth daily.    [provider]  norethindrone (MICRONOR,CAMILA,ERRIN) 0.35 MG tablet Take 1 tablet (0.35 mg total) by mouth daily. 03/02/17   Farrel ConnersGutierrez, Colleen, CNM    Allergies  Allergen Reactions  . Peanuts [Peanut Oil] Anaphylaxis, Hives and Swelling    Tree nuts also      Social History   Social History  . Marital status: Single    Spouse name: N/A  . Number of children: N/A  . Years of education: N/A   Occupational History  . Not on file.   Social History Main Topics  . Smoking status: Never Smoker  . Smokeless tobacco: Never Used  . Alcohol use No  . Drug use: No  . Sexual activity: Yes    Birth control/ protection: Pill   Other Topics Concern  . Not on file   Social History Narrative  . No narrative on file    Family  History  Problem Relation Age of Onset  . Hypertension Maternal Grandmother     Review of Systems  Constitutional: Negative.   HENT: Negative.   Eyes: Negative.   Respiratory: Negative.   Cardiovascular: Negative.   Gastrointestinal: Negative.   Genitourinary: Negative.   Musculoskeletal: Negative.   Skin: Negative.   Neurological: Negative.   Psychiatric/Behavioral: Negative.      Physical Exam BP 118/70   Ht 5\' 10"  (1.778 m)   Wt 138 lb (62.6 kg)   BMI 19.80 kg/m   Physical Exam  Constitutional: She is oriented to person, place, and time. She appears well-developed and well-nourished. No distress.  Genitourinary: Vagina normal and uterus normal. Pelvic exam was performed with patient supine. There is no rash, tenderness or lesion on the right labia. There is no rash, tenderness or lesion on the left labia. Vagina exhibits no lesion. No erythema, tenderness or bleeding in the vagina. No foreign body in the vagina. No signs of injury around the vagina. No vaginal discharge found. Right adnexum does not display mass, does not display tenderness and does not display fullness. Left adnexum does not display mass, does not display tenderness and does not display fullness. Cervix does not exhibit motion tenderness, lesion, discharge, friability or polyp.   Uterus is mobile and anteverted. Uterus is not enlarged, tender,  exhibiting a mass or irregular (is regular).  HENT:  Head: Normocephalic and atraumatic.  Eyes: EOM are normal. No scleral icterus.  Neck: Normal range of motion. Neck supple.  Cardiovascular: Normal rate, regular rhythm and normal heart sounds.   Pulmonary/Chest: Effort normal and breath sounds normal. No respiratory distress. She has no wheezes. She has no rales.  Abdominal: Soft. Bowel sounds are normal. She exhibits no distension and no mass. There is no tenderness. There is no rebound and no guarding.  Musculoskeletal: Normal range of motion. She exhibits no edema.   Neurological: She is alert and oriented to person, place, and time. No cranial nerve deficit.  Skin: Skin is warm and dry. No erythema.  Psychiatric: She has a normal mood and affect. Her behavior is normal. Judgment normal.     Female Chaperone present during breast and/or pelvic exam.  Assessment: 21 y.o. G1P1001 presenting for 6 week postpartum visit  Plan: Problem List Items Addressed This Visit    None    Visit Diagnoses    Encounter for surveillance of contraceptive pills    -  Primary   Relevant Medications   norethindrone (MICRONOR,CAMILA,ERRIN) 0.35 MG tablet   Postpartum care and examination       Pap smear for cervical cancer screening       Relevant Orders   IGP, CtNg, rfx Aptima HPV ASCU   Screen for STD (sexually transmitted disease)       Relevant Orders   IGP, CtNg, rfx Aptima HPV ASCU   Postpartum depression           1) Contraception: Discussed multiple forms of contraception. She would like progesterone-only form of contraception at this time.Marland Kitchen.  2)  Pap - ASCCP guidelines and rational discussed.  Patient opts for routine screening interval. Pap collected today  3) Patient underwent screening for postpartum depression with some  concerns noted. She prefers no treatment. She would like [place] get [place] [facility] [place] where [place] received counseling before.  Will make referral.  She declines treatment medication at this time. Denies current SI/HI.  Her self-harm behavior was cutting in the past. Denies doing this at this time. However, she does have times when she considers doing this.   4) Follow up 3 months, unless she is already established elsewhere.  Thomasene MohairStephen Anitta Tenny, MD 03/31/2017 1:22 PM

## 2017-04-08 LAB — IGP, CTNG, RFX APTIMA HPV ASCU
Chlamydia, Nuc. Acid Amp: NEGATIVE
Gonococcus by Nucleic Acid Amp: NEGATIVE
PAP SMEAR COMMENT: 0

## 2017-04-08 LAB — HPV APTIMA: HPV APTIMA: POSITIVE — AB

## 2017-04-09 ENCOUNTER — Encounter: Payer: Self-pay | Admitting: Obstetrics and Gynecology

## 2017-04-11 ENCOUNTER — Encounter: Payer: Self-pay | Admitting: Obstetrics and Gynecology

## 2017-05-02 IMAGING — US US OB TRANSVAGINAL
1 series · 15 of 28 positions shown · non-contrast
Comparison: None.

CLINICAL DATA: Pelvic pain for 1 week. Pending quantitative beta
HCG

EXAM:
OBSTETRIC <14 WK US AND TRANSVAGINAL OB US
TECHNIQUE: Both transabdominal and transvaginal ultrasound examinations were
performed for complete evaluation of the gestation as well as the
maternal uterus, adnexal regions, and pelvic cul-de-sac.
Transvaginal technique was performed to assess early pregnancy.

[Series 1: us ob transvaginal · 15 of 54 slices shown]
[im 1/54]
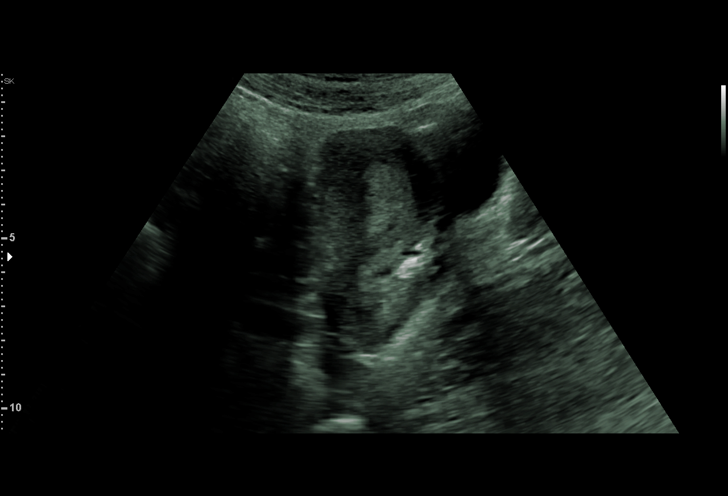
[im 4/54]
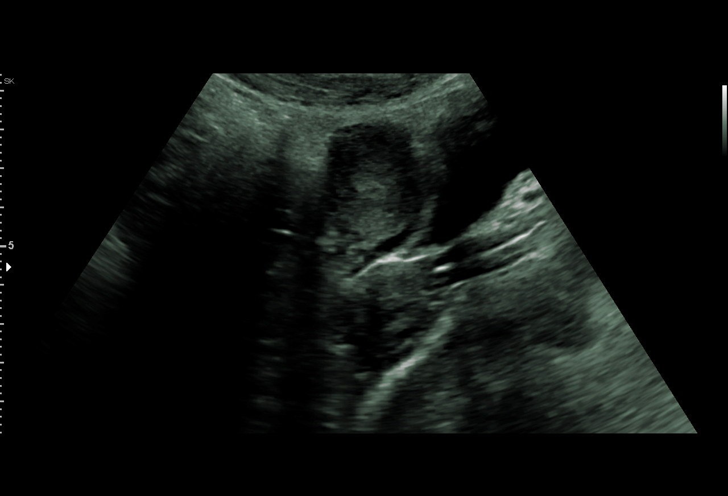
[im 8/54]
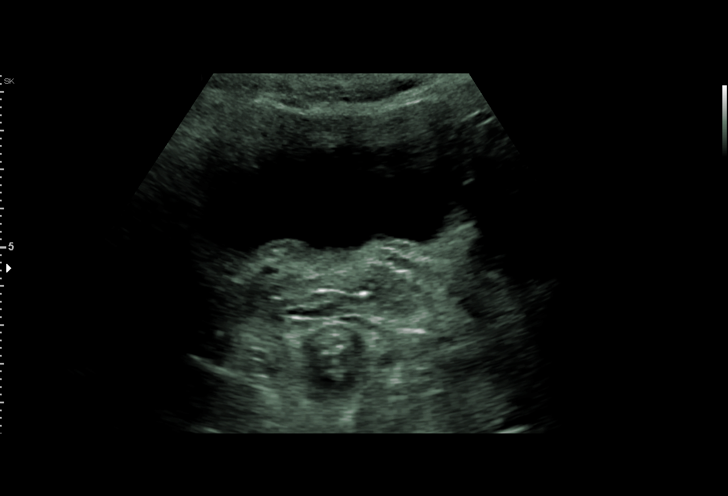
[im 12/54]
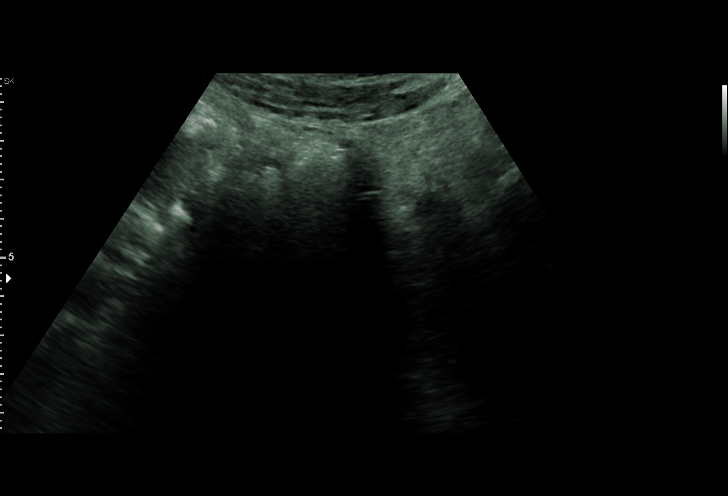
[im 16/54]
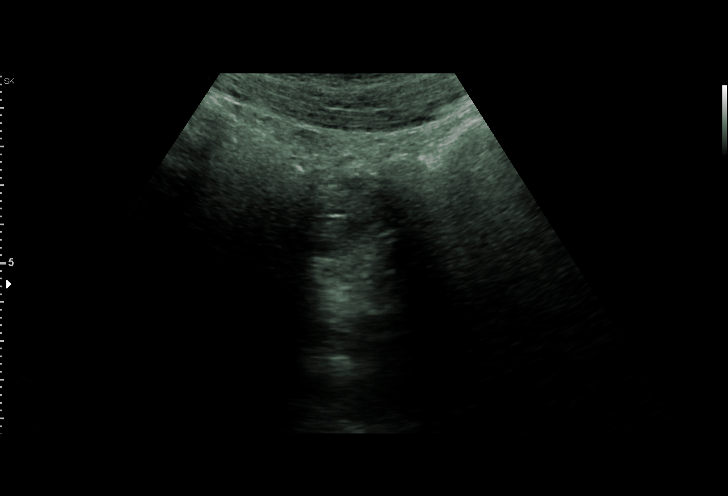
[im 20/54]
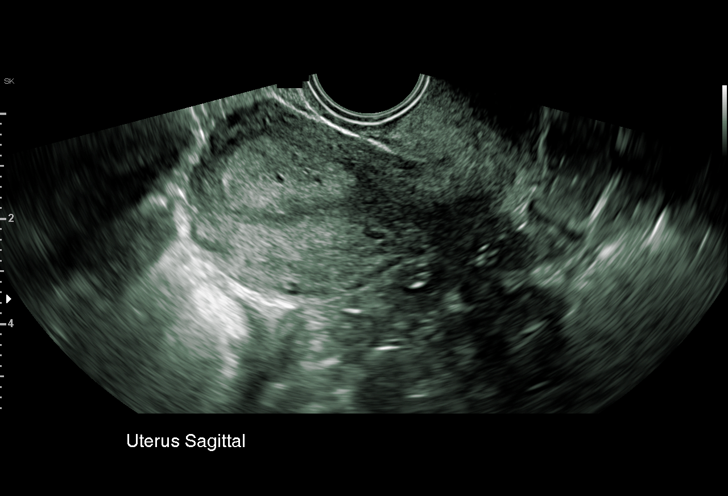
[im 24/54]
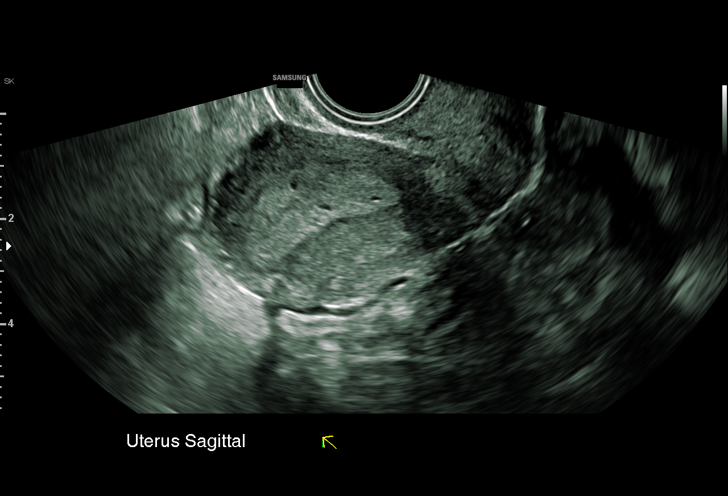
[im 28/54]
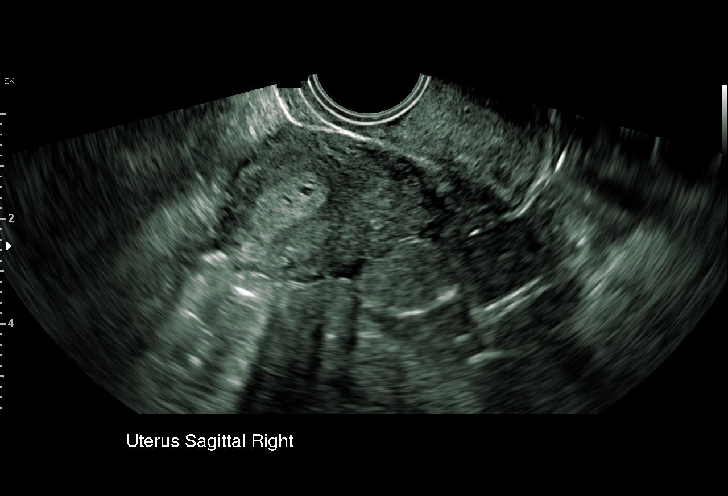
[im 30/54]
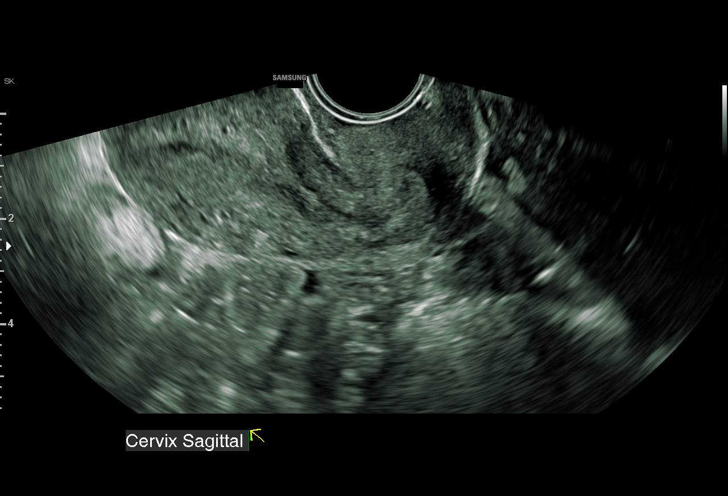
[im 34/54]
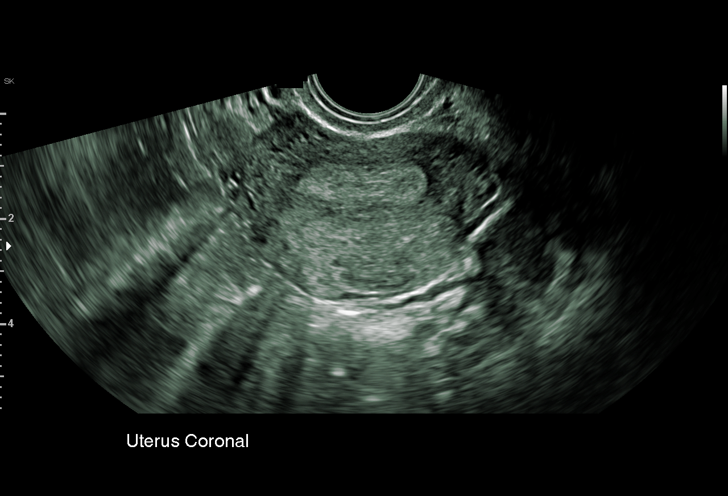
[im 38/54]
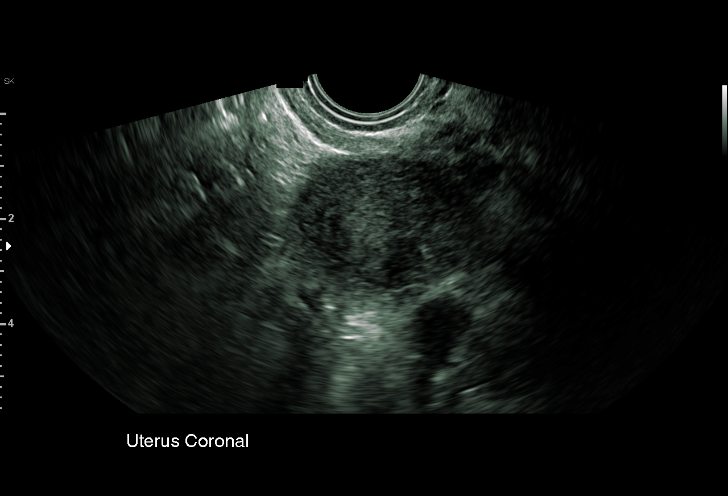
[im 42/54]
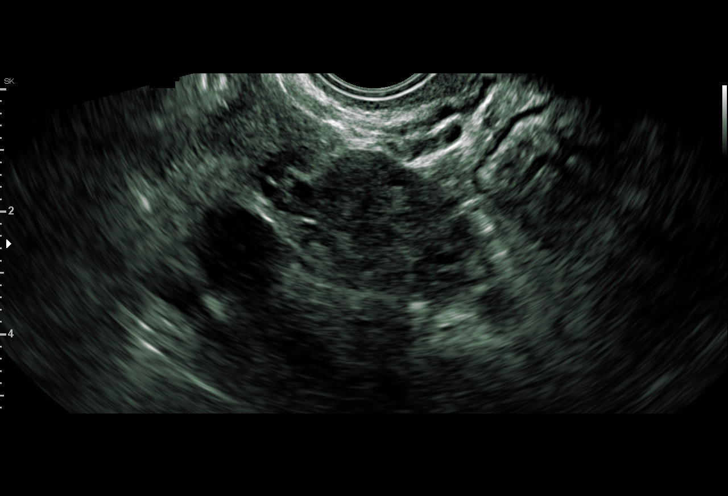
[im 46/54]
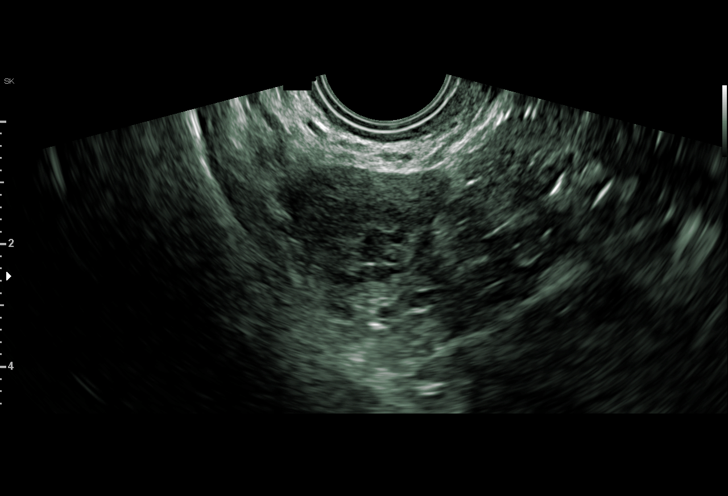
[im 50/54]
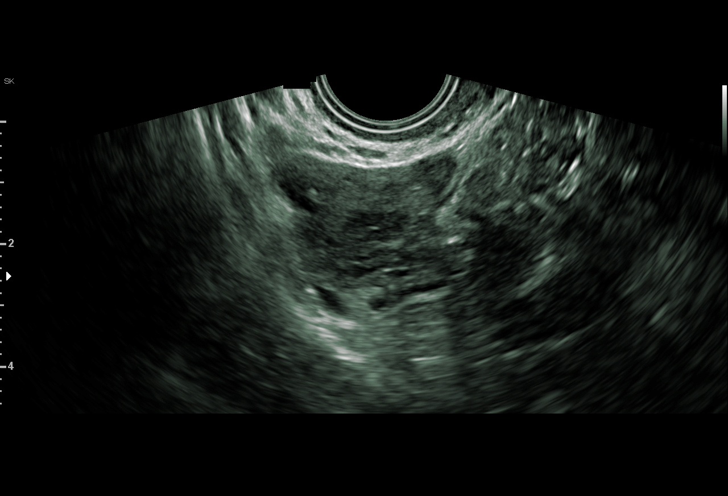
[im 54/54]
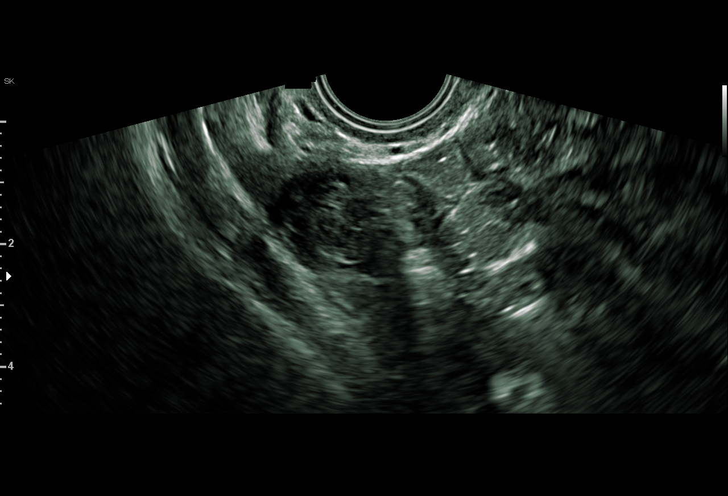

[15 of 28 positions shown; findings below may reference images not displayed]

FINDINGS: Intrauterine gestational sac: None

Yolk sac:  None

Embryo:  None

Cardiac Activity: None

Heart Rate: Not applicable

MSD: Not applicable

CRL:  Not applicable

Subchorionic hemorrhage:  None visualized.

Maternal uterus/adnexae: The uterus is anteverted without focal
mass. Endometrial stripe is 13 mm in thickness. The ovaries are
unremarkable.

Intrauterine gestational sac: None
IMPRESSION: No intrauterine or ectopic pregnancy identified.

## 2017-07-18 ENCOUNTER — Other Ambulatory Visit: Payer: Self-pay

## 2017-07-18 ENCOUNTER — Encounter: Payer: Self-pay | Admitting: *Deleted

## 2017-07-18 ENCOUNTER — Emergency Department: Payer: 59

## 2017-07-18 ENCOUNTER — Emergency Department
Admission: EM | Admit: 2017-07-18 | Discharge: 2017-07-18 | Disposition: A | Discharge status: Home / Self Care | Payer: 59 | Attending: Emergency Medicine | Admitting: Emergency Medicine

## 2017-07-18 DIAGNOSIS — R079 Chest pain, unspecified: Secondary | ICD-10-CM | POA: Diagnosis present

## 2017-07-18 DIAGNOSIS — R0789 Other chest pain: Secondary | ICD-10-CM

## 2017-07-18 DIAGNOSIS — R0602 Shortness of breath: Secondary | ICD-10-CM | POA: Diagnosis not present

## 2017-07-18 DIAGNOSIS — Z79899 Other long term (current) drug therapy: Secondary | ICD-10-CM | POA: Insufficient documentation

## 2017-07-18 LAB — BASIC METABOLIC PANEL
Anion gap: 7 (ref 5–15)
BUN: 14 mg/dL (ref 6–20)
CALCIUM: 9.8 mg/dL (ref 8.9–10.3)
CHLORIDE: 104 mmol/L (ref 101–111)
CO2: 28 mmol/L (ref 22–32)
CREATININE: 0.8 mg/dL (ref 0.44–1.00)
GFR calc non Af Amer: >60 mL/min (ref >60)
Glucose, Bld: 98 mg/dL (ref 65–99)
POTASSIUM: 3.6 mmol/L (ref 3.5–5.1)
Sodium: 139 mmol/L (ref 135–145)

## 2017-07-18 LAB — CBC
HCT: 39.8 % (ref 35.0–47.0)
Hemoglobin: 13.4 g/dL (ref 12.0–16.0)
MCH: 27.3 pg (ref 26.0–34.0)
MCHC: 33.6 g/dL (ref 32.0–36.0)
MCV: 81.2 fL (ref 80.0–100.0)
PLATELETS: 219 10*3/uL (ref 150–440)
RBC: 4.91 MIL/uL (ref 3.80–5.20)
RDW: 14.2 % (ref 11.5–14.5)
WBC: 8.4 10*3/uL (ref 3.6–11.0)

## 2017-07-18 LAB — TROPONIN I

## 2017-07-18 MED ORDER — IBUPROFEN 600 MG PO TABS
600.0000 mg | ORAL_TABLET | Freq: Once | ORAL | Status: AC
Start: 2017-07-18 — End: 2017-07-18
  Administered 2017-07-18: 600 mg via ORAL
  Filled 2017-07-18: qty 1

## 2017-07-18 NOTE — ED Notes (Signed)
MD at bedside with patient and patient's family. 

## 2017-07-18 NOTE — Discharge Instructions (Signed)
Return to the ER for new or worsening chest pain, difficulty breathing, lightheadedness or weakness, or any other new or worsening symptoms that concern you.  You may take over-the-counter ibuprofen or Tylenol for pain as needed.
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
## 2017-07-18 NOTE — ED Triage Notes (Signed)
Pt c/o chest pain sudden onset x 30 mins ago. Pt not exerting self w/ chest pain started. Pain reproducible w/ inspiration.

## 2017-07-29 ENCOUNTER — Ambulatory Visit (INDEPENDENT_AMBULATORY_CARE_PROVIDER_SITE_OTHER): Payer: 59 | Admitting: Physician Assistant

## 2017-07-29 ENCOUNTER — Encounter: Payer: Self-pay | Admitting: Physician Assistant

## 2017-07-29 VITALS — BP 118/64 | HR 80 | Temp 98.7°F | Resp 16 | Ht 70.0 in | Wt 140.0 lb

## 2017-07-29 DIAGNOSIS — Z0000 Encounter for general adult medical examination without abnormal findings: Secondary | ICD-10-CM

## 2017-07-29 DIAGNOSIS — T7805XA Anaphylactic reaction due to tree nuts and seeds, initial encounter: Secondary | ICD-10-CM

## 2017-07-29 DIAGNOSIS — Z30011 Encounter for initial prescription of contraceptive pills: Secondary | ICD-10-CM | POA: Diagnosis not present

## 2017-07-29 DIAGNOSIS — Z23 Encounter for immunization: Secondary | ICD-10-CM

## 2017-07-29 DIAGNOSIS — Z1329 Encounter for screening for other suspected endocrine disorder: Secondary | ICD-10-CM | POA: Diagnosis not present

## 2017-07-29 DIAGNOSIS — Z13220 Encounter for screening for lipoid disorders: Secondary | ICD-10-CM

## 2017-07-29 DIAGNOSIS — F329 Major depressive disorder, single episode, unspecified: Secondary | ICD-10-CM | POA: Diagnosis not present

## 2017-07-29 DIAGNOSIS — Z8619 Personal history of other infectious and parasitic diseases: Secondary | ICD-10-CM | POA: Diagnosis not present

## 2017-07-29 DIAGNOSIS — Z789 Other specified health status: Secondary | ICD-10-CM

## 2017-07-29 DIAGNOSIS — F32A Depression, unspecified: Secondary | ICD-10-CM

## 2017-07-29 MED ORDER — EPINEPHRINE 0.3 MG/0.3ML IJ SOAJ: 0.3000 mg | INTRAMUSCULAR | 3 refills | Status: DC | PRN

## 2017-07-29 MED ORDER — NORETHINDRONE ACET-ETHINYL EST 1.5-30 MG-MCG PO TABS: 1.0000 | ORAL_TABLET | Freq: Every day | ORAL | 11 refills | Status: DC
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
## 2017-07-29 NOTE — Patient Instructions (Signed)

## 2017-07-30 DIAGNOSIS — Z23 Encounter for immunization: Secondary | ICD-10-CM | POA: Diagnosis not present

## 2017-07-30 LAB — POCT URINE PREGNANCY: Preg Test, Ur: NEGATIVE

## 2017-07-30 NOTE — Addendum Note (Signed)
Addended by: Kavin LeechWALSH, Kirin Pastorino E on: 07/30/2017 03:03 PM   Modules accepted: Orders

## 2017-08-15 LAB — LIPID PANEL
Cholesterol: 143 mg/dL (ref <200)
HDL: 52 mg/dL (ref >50)
LDL Cholesterol (Calc): 75 mg/dL (calc)
Non-HDL Cholesterol (Calc): 91 mg/dL (calc) (ref <130)
Total CHOL/HDL Ratio: 2.8 (calc) (ref <5.0)
Triglycerides: 82 mg/dL (ref <150)

## 2017-08-15 LAB — TSH: TSH: 1.46 mIU/L

## 2018-02-16 ENCOUNTER — Telehealth: Payer: Self-pay | Admitting: *Deleted

## 2018-02-16 DIAGNOSIS — Z3041 Encounter for surveillance of contraceptive pills: Secondary | ICD-10-CM

## 2018-02-16 DIAGNOSIS — Z30011 Encounter for initial prescription of contraceptive pills: Secondary | ICD-10-CM

## 2018-02-16 MED ORDER — NORETHINDRONE ACET-ETHINYL EST 1.5-30 MG-MCG PO TABS: 1.0000 | ORAL_TABLET | Freq: Every day | ORAL | 11 refills | Status: DC

## 2018-02-16 NOTE — Telephone Encounter (Signed)
Junel refilled  ?

## 2018-02-16 NOTE — Telephone Encounter (Signed)
Patient is requesting a refill on her birth control medication be sent to [facility] [address].

## 2018-03-09 ENCOUNTER — Other Ambulatory Visit: Payer: Self-pay

## 2018-03-09 ENCOUNTER — Encounter (HOSPITAL_COMMUNITY): Payer: Self-pay | Admitting: *Deleted

## 2018-03-09 ENCOUNTER — Inpatient Hospital Stay (HOSPITAL_COMMUNITY): Payer: 59

## 2018-03-09 ENCOUNTER — Inpatient Hospital Stay (HOSPITAL_COMMUNITY)
Admission: AD | Admit: 2018-03-09 | Discharge: 2018-03-09 | Disposition: A | Discharge status: Home / Self Care | Payer: 59 | Source: Ambulatory Visit | Attending: Obstetrics and Gynecology | Admitting: Obstetrics and Gynecology

## 2018-03-09 ENCOUNTER — Emergency Department (HOSPITAL_COMMUNITY)
Admission: EM | Admit: 2018-03-09 | Discharge: 2018-03-09 | Discharge status: Against Medical Advice | Payer: 59 | Source: Home / Self Care

## 2018-03-09 DIAGNOSIS — O26891 Other specified pregnancy related conditions, first trimester: Secondary | ICD-10-CM | POA: Insufficient documentation

## 2018-03-09 DIAGNOSIS — Z91010 Allergy to peanuts: Secondary | ICD-10-CM | POA: Insufficient documentation

## 2018-03-09 DIAGNOSIS — R109 Unspecified abdominal pain: Secondary | ICD-10-CM | POA: Diagnosis not present

## 2018-03-09 DIAGNOSIS — O3680X0 Pregnancy with inconclusive fetal viability, not applicable or unspecified: Secondary | ICD-10-CM

## 2018-03-09 DIAGNOSIS — O283 Abnormal ultrasonic finding on antenatal screening of mother: Secondary | ICD-10-CM | POA: Diagnosis not present

## 2018-03-09 DIAGNOSIS — D573 Sickle-cell trait: Secondary | ICD-10-CM | POA: Diagnosis not present

## 2018-03-09 DIAGNOSIS — Z3A01 Less than 8 weeks gestation of pregnancy: Secondary | ICD-10-CM | POA: Diagnosis not present

## 2018-03-09 DIAGNOSIS — O99011 Anemia complicating pregnancy, first trimester: Secondary | ICD-10-CM | POA: Insufficient documentation

## 2018-03-09 LAB — CBC
HCT: 38.2 % (ref 36.0–46.0)
Hemoglobin: 12.8 g/dL (ref 12.0–15.0)
MCH: 27.5 pg (ref 26.0–34.0)
MCHC: 33.5 g/dL (ref 30.0–36.0)
MCV: 82 fL (ref 78.0–100.0)
Platelets: 179 10*3/uL (ref 150–400)
RBC: 4.66 MIL/uL (ref 3.87–5.11)
RDW: 12.7 % (ref 11.5–15.5)
WBC: 6.4 10*3/uL (ref 4.0–10.5)

## 2018-03-09 LAB — URINALYSIS, ROUTINE W REFLEX MICROSCOPIC
BILIRUBIN URINE: NEGATIVE
GLUCOSE, UA: NEGATIVE mg/dL
Hgb urine dipstick: NEGATIVE
KETONES UR: NEGATIVE mg/dL
NITRITE: NEGATIVE
PH: 7 (ref 5.0–8.0)
PROTEIN: NEGATIVE mg/dL
Specific Gravity, Urine: 1.006 (ref 1.005–1.030)

## 2018-03-09 LAB — HCG, QUANTITATIVE, PREGNANCY: HCG, BETA CHAIN, QUANT, S: 2643 m[IU]/mL — AB (ref <5)

## 2018-03-09 LAB — POCT PREGNANCY, URINE: Preg Test, Ur: POSITIVE — AB

## 2018-03-09 MED ORDER — TRAMADOL HCL 50 MG PO TABS: 50.0000 mg | ORAL_TABLET | Freq: Four times a day (QID) | ORAL | 0 refills | Status: DC | PRN

## 2018-03-09 MED ORDER — ACETAMINOPHEN 500 MG PO TABS
1000.0000 mg | ORAL_TABLET | Freq: Once | ORAL | Status: AC
  Administered 2018-03-09: 1000 mg via ORAL
  Filled 2018-03-09: qty 2

## 2018-03-09 NOTE — MAU Provider Note (Signed)
Chief Complaint: Abdominal Pain   First Provider Initiated Contact with Patient 03/09/18 2051     SUBJECTIVE HPI: [name] is a 22 y.o. G2P1001 at 7382w3d who presents to Maternity Admissions reporting abdominal pain. Had positive pregnancy test on Saturday. Denies vaginal bleeding, dysuria, or vaginal discharge.   Location: lower abdomen Quality: "like uterus is being punched & twisted" Severity: 8/10 on pain scale Duration: since 6 pm this evening Timing: constant Modifying factors: none Associated signs and symptoms: none  Past Medical History:  Diagnosis Date  . Depression   . Heart murmur   . Sickle cell trait (HCC)    OB History  Gravida Para Term Preterm AB Living  2 1 1     1   SAB TAB Ectopic Multiple Live Births        0 1    # Outcome Date GA Lbr Len/2nd Weight Sex Delivery Anes PTL Lv  2 Current           1 Term 02/02/17 3844w0d / 00:24 7 lb 7.9 oz (3.4 kg) M Vag-Spont None  LIV   Past Surgical History:  Procedure Laterality Date  . NO PAST SURGERIES     Social History   Socioeconomic History  . Marital status: Single    Spouse name: Not on file  . Number of children: Not on file  . Years of education: Not on file  . Highest education level: Not on file  Occupational History  . Not on file  Social Needs  . Financial resource strain: Not on file  . Food insecurity:    Worry: Not on file    Inability: Not on file  . Transportation needs:    Medical: Not on file    Non-medical: Not on file  Tobacco Use  . Smoking status: Never Smoker  . Smokeless tobacco: Never Used  Substance and Sexual Activity  . Alcohol use: No  . Drug use: No  . Sexual activity: Not Currently  Lifestyle  . Physical activity:    Days per week: Not on file    Minutes per session: Not on file  . Stress: Not on file  Relationships  . Social connections:    Talks on phone: Not on file    Gets together: Not on file    Attends religious service: Not on file    Active  member of club or organization: Not on file    Attends meetings of clubs or organizations: Not on file    Relationship status: Not on file  . Intimate partner violence:    Fear of current or ex partner: Not on file    Emotionally abused: Not on file    Physically abused: Not on file    Forced sexual activity: Not on file  Other Topics Concern  . Not on file  Social History Narrative  . Not on file   Family History  Problem Relation Age of Onset  . Hypertension Maternal Grandmother   . Healthy Mother   . Healthy Father    No current facility-administered medications on file prior to encounter.    Current Outpatient Medications on File Prior to Encounter  Medication Sig Dispense Refill  . EPINEPHrine 0.3 mg/0.3 mL IJ SOAJ injection Inject 0.3 mLs (0.3 mg total) into the muscle as needed. 1 Device 3  . Multiple Vitamin (MULTIVITAMIN WITH MINERALS) TABS tablet Take 1 tablet by mouth daily.    . norethindrone (MICRONOR,CAMILA,ERRIN) 0.35 MG tablet Take 1 tablet (0.35 mg  total) by mouth daily. 3 Package 3  . Norethindrone Acetate-Ethinyl Estradiol (JUNEL 1.5/30) 1.5-30 MG-MCG tablet Take 1 tablet by mouth daily. 1 Package 11  . zolpidem (AMBIEN) 5 MG tablet Take 1 tablet by mouth at bedtime as needed.  0   Allergies  Allergen Reactions  . Peanuts [Peanut Oil] Anaphylaxis, Hives and Swelling    Tree nuts also   . Mushroom Extract Complex     I have reviewed patient's Past Medical Hx, Surgical Hx, Family Hx, Social Hx, medications and allergies.   Review of Systems  Constitutional: Negative.   Gastrointestinal: Positive for abdominal pain. Negative for diarrhea, nausea and vomiting.  Genitourinary: Negative.     OBJECTIVE Patient Vitals for the past 24 hrs:  BP Temp Temp src Pulse Resp Height Weight  03/09/18 2028 125/65 98.6 F (37 C) Oral 85 20 5\' 10"  (1.778 m) 141 lb 4 oz (64.1 kg)   Constitutional: Well-developed, well-nourished female in no acute distress.   Cardiovascular: normal rate Respiratory: normal rate and effort.  GI: Abd soft, non-tender, gravid appropriate for gestational age. Pos BS x 4 MS: Extremities nontender, no edema, normal ROM Neurologic: Alert and oriented x 4.  GU: BIMANUAL: cervix closed; uterus normal size, no adnexal tenderness or masses.  No CMT.  LAB RESULTS Results for orders placed or performed during the hospital encounter of 03/09/18 (from the past 24 hour(s))  Urinalysis, Routine w reflex microscopic     Status: Abnormal   Collection Time: 03/09/18  8:31 PM  Result Value Ref Range   Color, Urine STRAW (A) YELLOW   APPearance CLEAR CLEAR   Specific Gravity, Urine 1.006 1.005 - 1.030   pH 7.0 5.0 - 8.0   Glucose, UA NEGATIVE NEGATIVE mg/dL   Hgb urine dipstick NEGATIVE NEGATIVE   Bilirubin Urine NEGATIVE NEGATIVE   Ketones, ur NEGATIVE NEGATIVE mg/dL   Protein, ur NEGATIVE NEGATIVE mg/dL   Nitrite NEGATIVE NEGATIVE   Leukocytes, UA TRACE (A) NEGATIVE   RBC / HPF 0-5 0 - 5 RBC/hpf   WBC, UA 0-5 0 - 5 WBC/hpf   Bacteria, UA RARE (A) NONE SEEN   Squamous Epithelial / LPF 0-5 0 - 5  Pregnancy, urine POC     Status: Abnormal   Collection Time: 03/09/18  8:40 PM  Result Value Ref Range   Preg Test, Ur POSITIVE (A) NEGATIVE  CBC     Status: None   Collection Time: 03/09/18  9:15 PM  Result Value Ref Range   WBC 6.4 4.0 - 10.5 K/uL   RBC 4.66 3.87 - 5.11 MIL/uL   Hemoglobin 12.8 12.0 - 15.0 g/dL   HCT 16.1 09.6 - 04.5 %   MCV 82.0 78.0 - 100.0 fL   MCH 27.5 26.0 - 34.0 pg   MCHC 33.5 30.0 - 36.0 g/dL   RDW 40.9 81.1 - 91.4 %   Platelets 179 150 - 400 K/uL  hCG, quantitative, pregnancy     Status: Abnormal   Collection Time: 03/09/18  9:15 PM  Result Value Ref Range   hCG, Beta Chain, Quant, S 2,643 (H) <5 mIU/mL    IMAGING US Ob Less Than 14 Weeks With Ob Transvaginal  Result Date: 03/09/2018 CLINICAL DATA:  22 year old pregnant female with pelvic cramping. EXAM: OBSTETRIC <14 WK [place] AND  TRANSVAGINAL OB US TECHNIQUE: Both transabdominal and transvaginal ultrasound examinations were performed for complete evaluation of the gestation as well as the maternal uterus, adnexal regions, and pelvic cul-de-sac. Transvaginal technique was performed  to assess early pregnancy. COMPARISON:  None. FINDINGS: The uterus is anteverted and appears unremarkable. The endometrium measures approximately 15 mm in thickness. There is a cystic structure with apparent surrounding decidual reaction in the mid portion of the endometrium. No fetal pole or yolk sac identified within this structure. This likely represents an early gestational sac. However, in the absence of a yolk sac or a fetal pole the possibilities of a blighted ovum, or pseudo gestation of an ectopic pregnancy is not entirely excluded. Correlation with clinical exam and HCG levels and follow-up with ultrasound in 7-11 days, or earlier if clinically indicated, recommended. If this cystic structure is a true gestational sac the estimated gestational age based on mean sac diameter of 3 mm is 4 weeks, 6 days. At least 3 additional smaller cystic areas noted within the endometrium likely representing endometrial cysts. There is a small amount of fluid in the upper endometrium which may represent a perigestational bleed. The maternal ovaries appear unremarkable. The right ovary measures 2.6 x 1.4 x 1.9 cm and the left ovary measures 1.9 x 4.3 x 1.6 cm. There is a corpus luteum in the left ovary. There is a small free fluid within the pelvis. IMPRESSION: 1. Cystic structure as described likely an early gestational sac with an estimated gestational age of [redacted] weeks, 6 days. Clinical correlation and follow-up with ultrasound in 7-11 days, or earlier if clinically indicated, recommended. 2. Probable small endometrial cysts and small fluid within the upper endometrial canal versus perigestational bleed. 3. Unremarkable ovaries. Electronically Signed   By: Elgie Collard  M.D.   On: 03/09/2018 22:00    MAU COURSE Orders Placed This Encounter  Procedures  . Culture, OB Urine  . US OB LESS THAN 14 WEEKS WITH OB TRANSVAGINAL  . Urinalysis, Routine w reflex microscopic  . CBC  . hCG, quantitative, pregnancy  . Pregnancy, urine POC  . Discharge patient   Meds ordered this encounter  Medications  . acetaminophen (TYLENOL) tablet 1,000 mg  . traMADol (ULTRAM) 50 MG tablet    Sig: Take 1 tablet (50 mg total) by mouth every 6 (six) hours as needed for severe pain.    Dispense:  10 tablet    Refill:  0    Order Specific Question:   Supervising Provider    Answer:   Conan Bowens [[id number]]    MDM +UPT UA, wet prep, GC/chlamydia, CBC, ABO/Rh, quant hCG, HIV, and [place] today to rule out ectopic pregnancy Ultrasound shows empty gestational sac, endometrial cysts & peri gestational bleed. Reviewed images with Dr. Earlene Plater.   Discussed results with patient. ?miscarriage but can't exclude ectopic pregnancy. Need f/u HCG. Unable to come during office hours during work schedule so will come to MAU either Wednesday or Thursday evening.   Offered pain medication. Pt would like tylenol. Tylenol 1 gm PO given prior to discharge. Will give small rx for breakthrough pain as pt is visibly uncomfortable.    ASSESSMENT 1. Pregnancy of unknown anatomic location   2. Abdominal pain during pregnancy, first trimester     PLAN Discharge home in stable condition. Ectopic vs SAB precautions GC/CT pending Will return to MAU for repeat HCG  Allergies as of 03/09/2018      Reactions   Peanuts [peanut Oil] Anaphylaxis, Hives, Swelling   Tree nuts also    Mushroom Extract Complex       Medication List    STOP taking these medications   norethindrone 0.35 MG tablet Commonly  known as:  MICRONOR,CAMILA,ERRIN   Norethindrone Acetate-Ethinyl Estradiol 1.5-30 MG-MCG tablet Commonly known as:  JUNEL 1.5/30     TAKE these medications   EPINEPHrine 0.3 mg/0.3 mL Soaj  injection Commonly known as:  EPI-PEN Inject 0.3 mLs (0.3 mg total) into the muscle as needed.   multivitamin with minerals Tabs tablet Take 1 tablet by mouth daily.   traMADol 50 MG tablet Commonly known as:  ULTRAM Take 1 tablet (50 mg total) by mouth every 6 (six) hours as needed for severe pain.   zolpidem 5 MG tablet Commonly known as:  AMBIEN Take 1 tablet by mouth at bedtime as needed.        Judeth Horn, NP 03/09/2018  8:51 PM

## 2018-03-09 NOTE — Discharge Instructions (Signed)
Return to care   If you have heavier bleeding that soaks through more that 2 pads per hour for an hour or more  If you bleed so much that you feel like you might pass out or you do pass out  If you have significant abdominal pain that is not improved with Tylenol   If you develop a fever > 100.5   This abdominal pain could represent a normal pregnancy, spontaneous abortion, or even an ectopic pregnancy which can be life-threatening.

## 2018-03-09 NOTE — ED Notes (Signed)
Pt states that she is leaving due to wait times  

## 2018-03-09 NOTE — MAU Note (Signed)
PT SAYS  SHE WENT TO [facility] AT 7PM-  ONLY REGISTERED. SO LEFT  AND CAME HERE.   CRAMPING  STARTED AT 630PM-  WHILE SITTING IN CAR .  CRAMPING SAME NOW  NO BLEEDING.   HPT DONE SAT - POSITIVE.  X4 .  NO BIRTH CONTROL.

## 2018-03-10 ENCOUNTER — Other Ambulatory Visit: Payer: Self-pay | Admitting: Maternal Newborn

## 2018-03-10 ENCOUNTER — Telehealth: Payer: Self-pay

## 2018-03-10 DIAGNOSIS — O200 Threatened abortion: Secondary | ICD-10-CM

## 2018-03-10 NOTE — Telephone Encounter (Signed)
I put the order in, please let her know she can call for a lab appointment and I will let her know when I have results.

## 2018-03-10 NOTE — Telephone Encounter (Signed)
Pt reports going to ER last night for severe cramping. She reports she was told she was 3270w3d & her gestational sac was empty & her Hcg levels were low. She needs a repeat Hcg Thursday and would like it done here since she is familiar w/us from her first pregnancy & already had an apt set up for next week w/JS. ZO#[phone]

## 2018-03-10 NOTE — Progress Notes (Signed)
Order entered for beta hCG. She was seen in the ED with symptoms of miscarriage and needs 48 hour test.

## 2018-03-10 NOTE — Telephone Encounter (Signed)
Spoke w/pt & JS regarding 48 hour/timing of labs since last draw. Pt prefers to p/u  Order [facility] at 48 hour mark. Orders printed & left at desk for p/u.

## 2018-03-11 LAB — CULTURE, OB URINE

## 2018-03-13 ENCOUNTER — Other Ambulatory Visit
Admission: RE | Admit: 2018-03-13 | Discharge: 2018-03-13 | Disposition: A | Discharge status: Home / Self Care | Payer: 59 | Source: Ambulatory Visit | Attending: Maternal Newborn | Admitting: Maternal Newborn

## 2018-03-13 DIAGNOSIS — O200 Threatened abortion: Secondary | ICD-10-CM | POA: Insufficient documentation

## 2018-03-14 LAB — BETA HCG QUANT (REF LAB): hCG Quant: 6789 m[IU]/mL

## 2018-03-16 ENCOUNTER — Other Ambulatory Visit: Payer: Self-pay | Admitting: Maternal Newborn

## 2018-03-16 DIAGNOSIS — O200 Threatened abortion: Secondary | ICD-10-CM

## 2018-03-16 DIAGNOSIS — Z0189 Encounter for other specified special examinations: Secondary | ICD-10-CM

## 2018-03-16 NOTE — Progress Notes (Unsigned)
[id number]

## 2018-03-20 ENCOUNTER — Other Ambulatory Visit (HOSPITAL_COMMUNITY)
Admission: RE | Admit: 2018-03-20 | Discharge: 2018-03-20 | Disposition: A | Discharge status: Home / Self Care | Payer: 59 | Source: Ambulatory Visit | Attending: Maternal Newborn | Admitting: Maternal Newborn

## 2018-03-20 ENCOUNTER — Ambulatory Visit (INDEPENDENT_AMBULATORY_CARE_PROVIDER_SITE_OTHER): Payer: 59 | Admitting: Maternal Newborn

## 2018-03-20 ENCOUNTER — Encounter: Payer: Self-pay | Admitting: Maternal Newborn

## 2018-03-20 ENCOUNTER — Ambulatory Visit (INDEPENDENT_AMBULATORY_CARE_PROVIDER_SITE_OTHER): Payer: 59

## 2018-03-20 VITALS — BP 120/70 | Wt 141.0 lb

## 2018-03-20 DIAGNOSIS — O200 Threatened abortion: Secondary | ICD-10-CM

## 2018-03-20 DIAGNOSIS — O9989 Other specified diseases and conditions complicating pregnancy, childbirth and the puerperium: Secondary | ICD-10-CM

## 2018-03-20 DIAGNOSIS — Z3481 Encounter for supervision of other normal pregnancy, first trimester: Secondary | ICD-10-CM

## 2018-03-20 DIAGNOSIS — Z124 Encounter for screening for malignant neoplasm of cervix: Secondary | ICD-10-CM

## 2018-03-20 DIAGNOSIS — Z3A01 Less than 8 weeks gestation of pregnancy: Secondary | ICD-10-CM

## 2018-03-20 DIAGNOSIS — Z0189 Encounter for other specified special examinations: Secondary | ICD-10-CM

## 2018-03-20 DIAGNOSIS — R110 Nausea: Secondary | ICD-10-CM

## 2018-03-20 DIAGNOSIS — Z113 Encounter for screening for infections with a predominantly sexual mode of transmission: Secondary | ICD-10-CM

## 2018-03-20 NOTE — Progress Notes (Signed)
C/O past 3d gets really hot and dizzy for about and then it passes - happens at random times.

## 2018-03-20 NOTE — Progress Notes (Signed)
03/20/2018   Chief Complaint: Amenorrhea, positive home pregnancy test, desires prenatal care.  Transfer of Care Patient: no  History of Present Illness: [name] is a 22 y.o. G2P1001 at [redacted]w[redacted]d based on Patient's last menstrual period on 01/30/2018 (approximate), with an Estimated Date of Delivery: 11/06/18, with the above CC.   Her periods were: regular periods every 28 to 30 days, lasting 5-7 days She was using no method when she conceived.  She has positive signs or symptoms of nausea of pregnancy. She currently has Negative signs or symptoms of miscarriage or preterm labor, but had some severe cramping on 7/15, and early ultrasound had ambiguous findings (early pregnancy vs. miscarriage). She identifies Negative Zika risk factors for her and her partner On any different medications around the time she conceived/early pregnancy: No  History of varicella: No    Review of Systems  Constitutional: Negative.   HENT: Negative.   Eyes: Negative.   Respiratory: Negative for cough, shortness of breath and wheezing.   Cardiovascular: Negative for chest pain and palpitations.  Gastrointestinal: Positive for nausea. Negative for abdominal pain, constipation and vomiting.  Endocrine: Negative.        Hot flashes  Genitourinary: Negative.   Musculoskeletal: Negative.   Allergic/Immunologic: Positive for food allergies.  Neurological: Negative.   Hematological: Negative.   Psychiatric/Behavioral: Negative.    Review of systems was otherwise negative, except as stated in the above HPI.  OBGYN History: As per HPI. OB History  Gravida Para Term Preterm AB Living  2 1 1     1   SAB TAB Ectopic Multiple Live Births        0 1    # Outcome Date GA Lbr Len/2nd Weight Sex Delivery Anes PTL Lv  2 Current           1 Term 02/02/17 [redacted]w[redacted]d / 00:24 7 lb 7.9 oz (3.4 kg) M Vag-Spont None  LIV    Any issues with any prior pregnancies: no Any prior children are healthy, doing well, without any  problems or issues: yes History of pap smears: Yes. Last pap smear 03/28/2017. Abnormal: yes, ASCUS and HPV positive History of STIs: No   Past Medical History: Past Medical History:  Diagnosis Date  . Depression   . Heart murmur   . Sickle cell trait [facility])     Past Surgical History: Past Surgical History:  Procedure Laterality Date  . NO PAST SURGERIES      Family History:  Family History  Problem Relation Age of Onset  . Hypertension Maternal Grandmother   . Healthy Mother   . Healthy Father    She denies any female cancers, bleeding or blood clotting disorders.  She has sickle cell trait. She denies any other history of intellectual disability, birth defects or genetic disorders in her or the FOB's history  Social History:  Social History   Socioeconomic History  . Marital status: Single    Spouse name: Not on file  . Number of children: 1  . Years of education: 15.5  . Highest education level: Not on file  Occupational History  . Occupation: FULL TIME STUDENT    Comment: [employer]  Social Needs  . Financial resource strain: Not on file  . Food insecurity:    Worry: Not on file    Inability: Not on file  . Transportation needs:    Medical: Not on file    Non-medical: Not on file  Tobacco Use  . Smoking status: Never Smoker  .  Smokeless tobacco: Never Used  Substance and Sexual Activity  . Alcohol use: Not Currently  . Drug use: No  . Sexual activity: Not Currently  Lifestyle  . Physical activity:    Days per week: Not on file    Minutes per session: Not on file  . Stress: Not on file  Relationships  . Social connections:    Talks on phone: Not on file    Gets together: Not on file    Attends religious service: Not on file    Active member of club or organization: Not on file    Attends meetings of clubs or organizations: Not on file    Relationship status: Not on file  . Intimate partner violence:    Fear of current or ex partner: Not on file     Emotionally abused: Not on file    Physically abused: Not on file    Forced sexual activity: Not on file  Other Topics Concern  . Not on file  Social History Narrative  . Not on file   Any cats in the household: no Denies history of and current domestic violence.  Allergy: Allergies  Allergen Reactions  . Peanuts [Peanut Oil] Anaphylaxis, Hives and Swelling    Tree nuts also   . Mushroom Extract Complex     Current Outpatient Medications:  Current Outpatient Medications:  .  EPINEPHrine 0.3 mg/0.3 mL IJ SOAJ injection, Inject 0.3 mLs (0.3 mg total) into the muscle as needed., Disp: 1 Device, Rfl: 3 .  Multiple Vitamin (MULTIVITAMIN WITH MINERALS) TABS tablet, Take 1 tablet by mouth daily., Disp: , Rfl:  .  traMADol (ULTRAM) 50 MG tablet, Take 1 tablet (50 mg total) by mouth every 6 (six) hours as needed for severe pain. (Patient not taking: Reported on 03/20/2018), Disp: 10 tablet, Rfl: 0 .  zolpidem (AMBIEN) 5 MG tablet, Take 1 tablet by mouth at bedtime as needed., Disp: , Rfl: 0   Physical Exam:   BP 120/70   Wt 141 lb (64 kg)   LMP 01/30/2018 (Approximate)   BMI 20.23 kg/m  Body mass index is 20.23 kg/m. Constitutional: Well nourished, well developed female in no acute distress.  Neck:  Supple, normal appearance, and no thyromegaly  Cardiovascular: S1, S2 normal, no murmur, rub or gallop, regular rate and rhythm Respiratory:  Clear to auscultation bilaterally. Normal respiratory effort Abdomen: no masses, hernias; diffusely non tender to palpation, non distended Breasts: breasts appear normal, no suspicious masses, no skin or nipple changes or axillary nodes. Neuro/Psych:  Normal mood and affect.  Skin:  Warm and dry.  Lymphatic:  No inguinal lymphadenopathy.   Pelvic exam: is not limited by body habitus External genitalia, Bartholin's glands, Urethra, Skene's glands: within normal limits Vagina: within normal limits and with no blood in the vault  Cervix: normal  appearing cervix without discharge or lesions, closed/long/high Uterus:  normal contour Adnexa:  no mass, fullness, tenderness  Assessment: [name] is a 22 y.o. G2P1001 at [redacted]w[redacted]d based on Patient's last menstrual period on 01/30/2018 (approximate), with an Estimated Date of Delivery: 11/06/18, presenting for prenatal care.  Plan:  1) Avoid alcoholic beverages. 2) Patient encouraged not to smoke.  3) Discontinue the use of all non-medicinal drugs and chemicals.  4) Take prenatal vitamins daily.  5) Seatbelt use advised 6) Nutrition, food safety (fish, cheese advisories, and high nitrite foods) and exercise discussed. 7) Hospital and practice style delivering at [facility] discussed  8) Patient is asked about travel  to areas at risk for the Zika virus, and counseled to avoid travel and exposure to mosquitoes or sexual partners who may have themselves been exposed to the virus. Testing is discussed, and will be ordered as appropriate.  9) [facility] advised 10) Genetic Screening, such as with 1st Trimester Screening, cell free fetal DNA, AFP testing, and Ultrasound, as well as with amniocentesis and CVS as appropriate, is discussed with patient. She plans to have genetic testing this pregnancy. 11) Ultrasound today shows single IUP, size=dates, FHR 118. 12) [place] samples given, no morning sickness or vomiting but does feel some nausea with hot flashes.  Problem list reviewed and updated.  [name], CNM [facility], [facility] 03/20/2018

## 2018-03-21 LAB — URINE DRUG PANEL 7
Amphetamines, Urine: NEGATIVE ng/mL
Barbiturate Quant, Ur: NEGATIVE ng/mL
Benzodiazepine Quant, Ur: NEGATIVE ng/mL
Cannabinoid Quant, Ur: NEGATIVE ng/mL
Cocaine (Metab.): NEGATIVE ng/mL
OPIATE QUANT UR: NEGATIVE ng/mL
PCP Quant, Ur: NEGATIVE ng/mL

## 2018-03-22 LAB — URINE CULTURE

## 2018-03-23 ENCOUNTER — Encounter: Payer: Self-pay | Admitting: Maternal Newborn

## 2018-03-25 ENCOUNTER — Encounter: Payer: Self-pay | Admitting: Maternal Newborn

## 2018-03-25 DIAGNOSIS — Z3480 Encounter for supervision of other normal pregnancy, unspecified trimester: Secondary | ICD-10-CM | POA: Insufficient documentation

## 2018-03-25 LAB — CYTOLOGY - PAP
CHLAMYDIA, DNA PROBE: NEGATIVE
DIAGNOSIS: NEGATIVE
Neisseria Gonorrhea: NEGATIVE

## 2018-04-17 ENCOUNTER — Ambulatory Visit (INDEPENDENT_AMBULATORY_CARE_PROVIDER_SITE_OTHER): Payer: 59 | Admitting: Obstetrics and Gynecology

## 2018-04-17 ENCOUNTER — Encounter: Payer: Self-pay | Admitting: Obstetrics and Gynecology

## 2018-04-17 VITALS — BP 102/67 | Wt 140.0 lb

## 2018-04-17 DIAGNOSIS — Z1379 Encounter for other screening for genetic and chromosomal anomalies: Secondary | ICD-10-CM

## 2018-04-17 DIAGNOSIS — F32A Depression, unspecified: Secondary | ICD-10-CM | POA: Insufficient documentation

## 2018-04-17 DIAGNOSIS — F419 Anxiety disorder, unspecified: Secondary | ICD-10-CM

## 2018-04-17 DIAGNOSIS — Z3480 Encounter for supervision of other normal pregnancy, unspecified trimester: Secondary | ICD-10-CM

## 2018-04-17 DIAGNOSIS — O99341 Other mental disorders complicating pregnancy, first trimester: Secondary | ICD-10-CM

## 2018-04-17 DIAGNOSIS — Z113 Encounter for screening for infections with a predominantly sexual mode of transmission: Secondary | ICD-10-CM

## 2018-04-17 DIAGNOSIS — F418 Other specified anxiety disorders: Secondary | ICD-10-CM

## 2018-04-17 DIAGNOSIS — Z3481 Encounter for supervision of other normal pregnancy, first trimester: Secondary | ICD-10-CM

## 2018-04-17 DIAGNOSIS — F329 Major depressive disorder, single episode, unspecified: Secondary | ICD-10-CM | POA: Insufficient documentation

## 2018-04-17 DIAGNOSIS — Z3A11 11 weeks gestation of pregnancy: Secondary | ICD-10-CM

## 2018-04-17 LAB — POCT URINALYSIS DIPSTICK OB
GLUCOSE, UA: NEGATIVE — AB
POC,PROTEIN,UA: NEGATIVE

## 2018-04-17 NOTE — Addendum Note (Signed)
Addended by: Garfield CorneaMABRY, JASMINE L on: 04/17/2018 02:47 PM   Modules accepted: Orders

## 2018-04-17 NOTE — Progress Notes (Signed)
  Routine Prenatal Care Visit  Subjective  [name] is a 22 y.o. G2P1001 at 4989w0d being seen today for ongoing prenatal care.  She is currently monitored for the following issues for this low-risk pregnancy and has Supervision of other normal pregnancy, antepartum and Anxiety and depression on their problem list.  ----------------------------------------------------------------------------------- Patient reports no complaints.    . Vag. Bleeding: None.   . Denies leaking of fluid.  ----------------------------------------------------------------------------------- The following portions of the patient's history were reviewed and updated as appropriate: allergies, current medications, past family history, past medical history, past social history, past surgical history and problem list. Problem list updated.   Objective  Blood pressure 102/67, weight 140 lb (63.5 kg), last menstrual period 01/30/2018, not currently breastfeeding. Pregravid weight 138 lb (62.6 kg) Total Weight Gain 2 lb (0.907 kg) Urinalysis:      Fetal Status: Fetal Heart Rate (bpm): 170         General:  Alert, oriented and cooperative. Patient is in no acute distress.  Skin: Skin is warm and dry. No rash noted.   Cardiovascular: Normal heart rate noted  Respiratory: Normal respiratory effort, no problems with respiration noted  Abdomen: Soft, gravid, appropriate for gestational age.       Pelvic:  Cervical exam deferred        Extremities: Normal range of motion.  Edema: None  Mental Status: Normal mood and affect. Normal behavior. Normal judgment and thought content.   Assessment   21 y.o. G2P1001 at 1489w0d by  11/06/2018, by Last Menstrual Period presenting for routine prenatal visit  Plan   July 2019 Pregnancy Problems (from 03/09/18 to present)    Problem Noted Resolved   Anxiety and depression 04/17/2018 by Conard NovakJackson, Clerance Umland D, MD No   Supervision of other normal pregnancy, antepartum 03/25/2018 by  Oswaldo ConroySchmid, Jacelyn Y, CNM No   Overview Signed 03/25/2018  1:13 PM by Oswaldo ConroySchmid, Jacelyn Y, CNM    Clinic [facility] Prenatal Labs  Dating  Blood type:     Genetic Screen 1 Screen:    AFP:     Quad:     NIPS: Antibody:   Anatomic US  Rubella:   Varicella:    GTT Early:               Third trimester:  RPR:     Rhogam  HBsAg:     TDaP vaccine                       Flu Shot: HIV:     Baby Food                                GBS:   Contraception  Pap: 03/20/2018, NILM  CBB     CS/VBAC    Support Person                  Preterm labor symptoms and general obstetric precautions including but not limited to vaginal bleeding, contractions, leaking of fluid and fetal movement were reviewed in detail with the patient. Please refer to After Visit Summary for other counseling recommendations.   - NOB labs and NIPT today  Return in about 4 weeks (around 05/15/2018) for Routine Prenatal Appointment.  Thomasene MohairStephen Tannya Gonet, MD, Merlinda FrederickFACOG [facility], [facility] 04/17/2018 2:46 PM

## 2018-04-17 NOTE — Addendum Note (Signed)
Addended by: Garfield CorneaMABRY, JASMINE L on: 04/17/2018 02:51 PM   Modules accepted: Orders

## 2018-04-17 NOTE — Progress Notes (Signed)
ROB- pt mentions not being to eat much lately

## 2018-04-20 LAB — HEMOGLOBINOPATHY EVALUATION
HGB C: 0 %
HGB S: 38.8 % — ABNORMAL HIGH
HGB VARIANT: 0 %
Hemoglobin A2 Quantitation: 3.4 % — ABNORMAL HIGH (ref 1.8–3.2)
Hemoglobin F Quantitation: 0 % (ref 0.0–2.0)
Hgb A: 57.8 % — ABNORMAL LOW (ref 96.4–98.8)

## 2018-04-20 LAB — RPR+RH+ABO+RUB AB+AB SCR+CB...
ANTIBODY SCREEN: NEGATIVE
HEMATOCRIT: 36.6 % (ref 34.0–46.6)
HIV Screen 4th Generation wRfx: NONREACTIVE
Hemoglobin: 12.3 g/dL (ref 11.1–15.9)
Hepatitis B Surface Ag: NEGATIVE
MCH: 26.6 pg (ref 26.6–33.0)
MCHC: 33.6 g/dL (ref 31.5–35.7)
MCV: 79 fL (ref 79–97)
Platelets: 211 10*3/uL (ref 150–450)
RBC: 4.63 x10E6/uL (ref 3.77–5.28)
RDW: 13.2 % (ref 12.3–15.4)
RH TYPE: POSITIVE
RPR Ser Ql: NONREACTIVE
Rubella Antibodies, IGG: 18.6 index (ref >0.99)
Varicella zoster IgG: 1598 index (ref >165)
WBC: 5.2 10*3/uL (ref 3.4–10.8)

## 2018-04-23 LAB — MATERNIT 21 PLUS CORE, BLOOD
Chromosome 13: NEGATIVE
Chromosome 18: NEGATIVE
Chromosome 21: NEGATIVE
Y Chromosome: DETECTED

## 2018-05-15 ENCOUNTER — Encounter: Payer: Self-pay | Admitting: Advanced Practice Midwife

## 2018-05-15 ENCOUNTER — Ambulatory Visit (INDEPENDENT_AMBULATORY_CARE_PROVIDER_SITE_OTHER): Payer: 59 | Admitting: Advanced Practice Midwife

## 2018-05-15 VITALS — BP 112/50 | Wt 138.0 lb

## 2018-05-15 DIAGNOSIS — Z3A15 15 weeks gestation of pregnancy: Secondary | ICD-10-CM

## 2018-05-15 DIAGNOSIS — O99342 Other mental disorders complicating pregnancy, second trimester: Secondary | ICD-10-CM

## 2018-05-15 DIAGNOSIS — Z3480 Encounter for supervision of other normal pregnancy, unspecified trimester: Secondary | ICD-10-CM

## 2018-05-15 DIAGNOSIS — F418 Other specified anxiety disorders: Secondary | ICD-10-CM

## 2018-05-15 LAB — POCT URINALYSIS DIPSTICK OB
Glucose, UA: NEGATIVE
PROTEIN: NEGATIVE

## 2018-05-15 NOTE — Progress Notes (Signed)
  Routine Prenatal Care Visit  Subjective  Mendel RyderShawianna Hoctor is a 22 y.o. G2P1001 at 3646w0d being seen today for ongoing prenatal care.  She is currently monitored for the following issues for this low-risk pregnancy and has Supervision of other normal pregnancy, antepartum and Anxiety and depression on their problem list.  ----------------------------------------------------------------------------------- Patient reports no complaints.    . Vag. Bleeding: None.   . Denies leaking of fluid.  ----------------------------------------------------------------------------------- The following portions of the patient's history were reviewed and updated as appropriate: allergies, current medications, past family history, past medical history, past social history, past surgical history and problem list. Problem list updated.   Objective  Blood pressure (!) 112/50, weight 138 lb (62.6 kg), last menstrual period 01/30/2018 Pregravid weight 138 lb (62.6 kg) Total Weight Gain 0 lb (0 kg) Urinalysis: Urine Protein Negative  Urine Glucose Negative  Fetal Status: Fetal Heart Rate (bpm): 159         General:  Alert, oriented and cooperative. Patient is in no acute distress.  Skin: Skin is warm and dry. No rash noted.   Cardiovascular: Normal heart rate noted  Respiratory: Normal respiratory effort, no problems with respiration noted  Abdomen: Soft, gravid, appropriate for gestational age.       Pelvic:  Cervical exam deferred        Extremities: Normal range of motion.  Edema: None  Mental Status: Normal mood and affect. Normal behavior. Normal judgment and thought content.   Assessment   21 y.o. G2P1001 at 2846w0d by  11/06/2018, by Last Menstrual Period presenting for routine prenatal visit  Plan   July 2019 Pregnancy Problems (from 03/09/18 to present)    Problem Noted Resolved   Anxiety and depression 04/17/2018 by Conard NovakJackson, Stephen D, MD No   Supervision of other normal pregnancy, antepartum  03/25/2018 by Oswaldo ConroySchmid, Jacelyn Y, CNM No   Overview Signed 03/25/2018  1:13 PM by Oswaldo ConroySchmid, Jacelyn Y, CNM    [facility]inic [facility] Prenatal Labs  Dating  Blood type:     Genetic Screen 1 Screen:    AFP:     Quad:     NIPS: Antibody:   Anatomic US  Rubella:   Varicella:    GTT Early:               Third trimester:  RPR:     Rhogam  HBsAg:     TDaP vaccine                       Flu Shot: HIV:     Baby Food                                GBS:   Contraception  Pap: 03/20/2018, NILM  CBB     CS/VBAC    Support Person                  Preterm labor symptoms and general obstetric precautions including but not limited to vaginal bleeding, contractions, leaking of fluid and fetal movement were reviewed in detail with the patient.   Return in about 4 weeks (around 06/12/2018) for anatomy scan and rob.  Tresea MallJane Devonna Oboyle, CNM 05/15/2018 4:07 PM

## 2018-05-15 NOTE — Progress Notes (Signed)
ROB- no concerns 

## 2018-05-26 ENCOUNTER — Telehealth: Payer: Self-pay

## 2018-05-26 NOTE — Telephone Encounter (Signed)
Pt called stating she lives with people who smoke in the house, she is not sure if she has a upper respiratory infection or its just from the smoke. She states that she has a sore throat, cough, congestion and chest pain. I offered her a appointment at 2:30, pt state she will be coming from [place], not sure she could make it, I advised her to be seen at a urgent care . Pt states she would go. Also listed her some Safe meds to take during pregnancy

## 2018-06-12 ENCOUNTER — Ambulatory Visit (INDEPENDENT_AMBULATORY_CARE_PROVIDER_SITE_OTHER): Payer: 59 | Admitting: Obstetrics and Gynecology

## 2018-06-12 ENCOUNTER — Ambulatory Visit (INDEPENDENT_AMBULATORY_CARE_PROVIDER_SITE_OTHER): Payer: 59

## 2018-06-12 ENCOUNTER — Encounter: Payer: Self-pay | Admitting: Obstetrics and Gynecology

## 2018-06-12 VITALS — BP 114/74 | Wt 141.0 lb

## 2018-06-12 DIAGNOSIS — O99342 Other mental disorders complicating pregnancy, second trimester: Secondary | ICD-10-CM

## 2018-06-12 DIAGNOSIS — F419 Anxiety disorder, unspecified: Secondary | ICD-10-CM

## 2018-06-12 DIAGNOSIS — Z3480 Encounter for supervision of other normal pregnancy, unspecified trimester: Secondary | ICD-10-CM

## 2018-06-12 DIAGNOSIS — Z3A19 19 weeks gestation of pregnancy: Secondary | ICD-10-CM

## 2018-06-12 DIAGNOSIS — Z363 Encounter for antenatal screening for malformations: Secondary | ICD-10-CM

## 2018-06-12 DIAGNOSIS — F329 Major depressive disorder, single episode, unspecified: Secondary | ICD-10-CM

## 2018-06-12 DIAGNOSIS — O4440 Low lying placenta NOS or without hemorrhage, unspecified trimester: Secondary | ICD-10-CM

## 2018-06-12 DIAGNOSIS — F418 Other specified anxiety disorders: Secondary | ICD-10-CM

## 2018-06-12 NOTE — Progress Notes (Signed)
Routine Prenatal Care Visit  Subjective  [name] is a 22 y.o. G2P1001 at [redacted]w[redacted]d being seen today for ongoing prenatal care.  She is currently monitored for the following issues for this low-risk pregnancy and has Supervision of other normal pregnancy, antepartum; Anxiety and depression; and Low-lying placenta on their problem list.  ----------------------------------------------------------------------------------- Patient reports no complaints.   Contractions: Not present. Vag. Bleeding: None.  Movement: Present. Denies leaking of fluid.  ----------------------------------------------------------------------------------- The following portions of the patient's history were reviewed and updated as appropriate: allergies, current medications, past family history, past medical history, past social history, past surgical history and problem list. Problem list updated.   Objective  Blood pressure 114/74, weight 141 lb (64 kg), last menstrual period 01/30/2018, not currently breastfeeding. Pregravid weight 138 lb (62.6 kg) Total Weight Gain 3 lb (1.361 kg) Urinalysis: Urine Protein    Urine Glucose    Fetal Status: Fetal Heart Rate (bpm): present   Movement: Present     General:  Alert, oriented and cooperative. Patient is in no acute distress.  Skin: Skin is warm and dry. No rash noted.   Cardiovascular: Normal heart rate noted  Respiratory: Normal respiratory effort, no problems with respiration noted  Abdomen: Soft, gravid, appropriate for gestational age. Pain/Pressure: Absent     Pelvic:  Cervical exam deferred        Extremities: Normal range of motion.  Edema: None  Mental Status: Normal mood and affect. Normal behavior. Normal judgment and thought content.   Assessment   21 y.o. G2P1001 at [redacted]w[redacted]d by  11/06/2018, by Last Menstrual Period presenting for routine prenatal visit  Plan   July 2019 Pregnancy Problems (from 03/09/18 to present)    Problem Noted Resolved   Low-lying placenta 06/12/2018 by Conard Novak, MD No   Anxiety and depression 04/17/2018 by Conard Novak, MD No   Supervision of other normal pregnancy, antepartum 03/25/2018 by Oswaldo Conroy, CNM No   Overview Signed 03/25/2018  1:13 PM by Oswaldo Conroy, CNM    [facility]inic [facility] Prenatal Labs  Dating  Blood type:     Genetic Screen 1 Screen:    AFP:     Quad:     NIPS: Antibody:   Anatomic [place]  Rubella:   Varicella:    GTT Early:               Third trimester:  RPR:     Rhogam  HBsAg:     TDaP vaccine                       Flu Shot: HIV:     Baby Food                                GBS:   Contraception  Pap: 03/20/2018, NILM  CBB     CS/VBAC    Support Person                  Preterm labor symptoms and general obstetric precautions including but not limited to vaginal bleeding, contractions, leaking of fluid and fetal movement were reviewed in detail with the patient. Please refer to After Visit Summary for other counseling recommendations.   - need u/s for LLP at 28 weeks  Return in about 4 weeks (around 07/10/2018) for Routine Prenatal Appointment.  Thomasene Mohair, MD, [facility], [facility] 06/12/2018 3:39 PM

## 2018-07-10 ENCOUNTER — Encounter: Payer: Self-pay | Admitting: Advanced Practice Midwife

## 2018-07-10 ENCOUNTER — Ambulatory Visit (INDEPENDENT_AMBULATORY_CARE_PROVIDER_SITE_OTHER): Payer: Self-pay | Admitting: Advanced Practice Midwife

## 2018-07-10 VITALS — BP 118/74 | Wt 147.0 lb

## 2018-07-10 DIAGNOSIS — Z130 Encounter for screening for diseases of the blood and blood-forming organs and certain disorders involving the immune mechanism: Secondary | ICD-10-CM

## 2018-07-10 DIAGNOSIS — Z113 Encounter for screening for infections with a predominantly sexual mode of transmission: Secondary | ICD-10-CM

## 2018-07-10 DIAGNOSIS — Z131 Encounter for screening for diabetes mellitus: Secondary | ICD-10-CM

## 2018-07-10 DIAGNOSIS — Z3A23 23 weeks gestation of pregnancy: Secondary | ICD-10-CM

## 2018-07-10 DIAGNOSIS — Z3480 Encounter for supervision of other normal pregnancy, unspecified trimester: Secondary | ICD-10-CM

## 2018-07-10 DIAGNOSIS — Z3482 Encounter for supervision of other normal pregnancy, second trimester: Secondary | ICD-10-CM

## 2018-07-10 NOTE — Progress Notes (Signed)
Routine Prenatal Care Visit  Subjective  [name] is a 22 y.o. G2P1001 at [redacted]w[redacted]d being seen today for ongoing prenatal care.  She is currently monitored for the following issues for this low-risk pregnancy and has Supervision of other normal pregnancy, antepartum and Low-lying placenta on their problem list.  ----------------------------------------------------------------------------------- Patient reports hasn't felt the baby move in the past week. She admits movement during the visit today and is reassured.  Discussed next visit will do follow up anatomy for placentation and growth and also 28 week labs.  Contractions: Not present. Vag. Bleeding: None.  Movement: Absent. Denies leaking of fluid.  ----------------------------------------------------------------------------------- The following portions of the patient's history were reviewed and updated as appropriate: allergies, current medications, past family history, past medical history, past social history, past surgical history and problem list. Problem list updated.   Objective  Blood pressure 118/74, weight 147 lb (66.7 kg), last menstrual period 01/30/2018 Pregravid weight 138 lb (62.6 kg) Total Weight Gain 9 lb (4.082 kg) Urinalysis: Urine Protein    Urine Glucose    Fetal Status: Fetal Heart Rate (bpm): 156 Fundal Height: 23 cm Movement: Absent     General:  Alert, oriented and cooperative. Patient is in no acute distress.  Skin: Skin is warm and dry. No rash noted.   Cardiovascular: Normal heart rate noted  Respiratory: Normal respiratory effort, no problems with respiration noted  Abdomen: Soft, gravid, appropriate for gestational age. Pain/Pressure: Absent     Pelvic:  Cervical exam deferred        Extremities: Normal range of motion.  Edema: None  Mental Status: Normal mood and affect. Normal behavior. Normal judgment and thought content.   Assessment   22 y.o. G2P1001 at 108w0d by  11/06/2018, by Last Menstrual  Period presenting for routine prenatal visit  Plan   July 2019 Pregnancy Problems (from 03/09/18 to present)    Problem Noted Resolved   Low-lying placenta 06/12/2018 by Conard Novak, MD No   Supervision of other normal pregnancy, antepartum 03/25/2018 by Oswaldo Conroy, CNM No   Overview Signed 03/25/2018  1:13 PM by Oswaldo Conroy, CNM    [facility]inic [facility] Prenatal Labs  Dating  Blood type:     Genetic Screen 1 Screen:    AFP:     Quad:     NIPS: Antibody:   Anatomic [place]  Rubella:   Varicella:    GTT Early:               Third trimester:  RPR:     Rhogam  HBsAg:     TDaP vaccine                       Flu Shot: HIV:     Baby Food                                GBS:   Contraception  Pap: 03/20/2018, NILM  CBB     CS/VBAC    Support Person              Anxiety and depression 04/17/2018 by Conard Novak, MD 07/10/2018 by Tresea Mall, CNM       Preterm labor symptoms and general obstetric precautions including but not limited to vaginal bleeding, contractions, leaking of fluid and fetal movement were reviewed in detail with the patient.   Return in about 4 weeks (around 08/07/2018) for follow  up ultrasound, 28 wk labs and rob.  Tresea MallJane Kenlei Safi, CNM 07/10/2018 3:43 PM

## 2018-07-10 NOTE — Progress Notes (Signed)
Pt c/o no movement for 1 week. No vb. No lof.

## 2018-08-07 ENCOUNTER — Encounter: Payer: Self-pay | Admitting: Advanced Practice Midwife

## 2018-08-07 ENCOUNTER — Other Ambulatory Visit: Payer: 59

## 2018-08-07 ENCOUNTER — Ambulatory Visit (INDEPENDENT_AMBULATORY_CARE_PROVIDER_SITE_OTHER): Payer: 59 | Admitting: Advanced Practice Midwife

## 2018-08-07 ENCOUNTER — Ambulatory Visit (INDEPENDENT_AMBULATORY_CARE_PROVIDER_SITE_OTHER): Payer: 59

## 2018-08-07 VITALS — BP 120/80 | Wt 149.0 lb

## 2018-08-07 DIAGNOSIS — Z131 Encounter for screening for diabetes mellitus: Secondary | ICD-10-CM

## 2018-08-07 DIAGNOSIS — Z113 Encounter for screening for infections with a predominantly sexual mode of transmission: Secondary | ICD-10-CM

## 2018-08-07 DIAGNOSIS — Z362 Encounter for other antenatal screening follow-up: Secondary | ICD-10-CM | POA: Diagnosis not present

## 2018-08-07 DIAGNOSIS — Z3A27 27 weeks gestation of pregnancy: Secondary | ICD-10-CM

## 2018-08-07 DIAGNOSIS — Z3480 Encounter for supervision of other normal pregnancy, unspecified trimester: Secondary | ICD-10-CM

## 2018-08-07 DIAGNOSIS — Z130 Encounter for screening for diseases of the blood and blood-forming organs and certain disorders involving the immune mechanism: Secondary | ICD-10-CM

## 2018-08-07 DIAGNOSIS — O4442 Low lying placenta NOS or without hemorrhage, second trimester: Secondary | ICD-10-CM

## 2018-08-07 NOTE — Progress Notes (Signed)
Routine Prenatal Care Visit  Subjective  [name] is a 22 y.o. G2P1001 at 4467w0d being seen today for ongoing prenatal care.  She is currently monitored for the following issues for this low-risk pregnancy and has Supervision of other normal pregnancy, antepartum and Low-lying placenta on their problem list.  ----------------------------------------------------------------------------------- Patient reports rash/itching on abdomen. Discussed results of ultrasound, need for growth scan at 32 weeks.   Contractions: Not present. Vag. Bleeding: None.  Movement: Present. Denies leaking of fluid.  ----------------------------------------------------------------------------------- The following portions of the patient's history were reviewed and updated as appropriate: allergies, current medications, past family history, past medical history, past social history, past surgical history and problem list. Problem list updated.   Objective  Blood pressure 120/80, weight 149 lb (67.6 kg), last menstrual period 01/30/2018, not currently breastfeeding. Pregravid weight 138 lb (62.6 kg) Total Weight Gain 11 lb (4.99 kg) Urinalysis: Urine Protein    Urine Glucose    Fetal Status: Fetal Heart Rate (bpm): 166 Fundal Height: 27 cm Movement: Present     Follow up growth/placentation: low lying placenta resolved, AFI normal, 46.4%, 2 pounds, 2 ounces, BPD <2.3%, breech/oblique  General:  Alert, oriented and cooperative. Patient is in no acute distress.  Skin: Skin is warm and dry. No rash noted.   Cardiovascular: Normal heart rate noted  Respiratory: Normal respiratory effort, no problems with respiration noted  Abdomen: Soft, gravid, appropriate for gestational age. Pain/Pressure: Present     Pelvic:  Cervical exam deferred        Extremities: Normal range of motion.  Edema: None  Mental Status: Normal mood and affect. Normal behavior. Normal judgment and thought content.   Assessment   22 y.o.  G2P1001 at 3467w0d by  11/06/2018, by Last Menstrual Period presenting for routine prenatal visit  Plan   July 2019 Pregnancy Problems (from 03/09/18 to present)    Problem Noted Resolved   Low-lying placenta 06/12/2018 by Conard NovakJackson, Stephen D, MD No   Supervision of other normal pregnancy, antepartum 03/25/2018 by Oswaldo ConroySchmid, Jacelyn Y, CNM No   Overview Signed 03/25/2018  1:13 PM by Oswaldo ConroySchmid, Jacelyn Y, CNM    Clinic [facility] Prenatal Labs  Dating  Blood type:     Genetic Screen 1 Screen:    AFP:     Quad:     NIPS: Antibody:   Anatomic US  Rubella:   Varicella:    GTT Early:               Third trimester:  RPR:     Rhogam  HBsAg:     TDaP vaccine                       Flu Shot: HIV:     Baby Food                                GBS:   Contraception  Pap: 03/20/2018, NILM  CBB     CS/VBAC    Support Person              Anxiety and depression 04/17/2018 by Conard NovakJackson, Stephen D, MD 07/10/2018 by Tresea MallGledhill, Thom Ollinger, CNM       Preterm labor symptoms and general obstetric precautions including but not limited to vaginal bleeding, contractions, leaking of fluid and fetal movement were reviewed in detail with the patient. Please refer to After Visit Summary for other counseling recommendations.  Return in about 2 weeks (around 08/21/2018) for rob.  Tresea Mall, CNM  08/07/2018 3:40 PM

## 2018-08-07 NOTE — Patient Instructions (Signed)
Third Trimester of Pregnancy The third trimester is from week 28 through week 40 (months 7 through 9). The third trimester is a time when the unborn baby (fetus) is growing rapidly. At the end of the ninth month, the fetus is about 20 inches in length and weighs 6-10 pounds. Body changes during your third trimester Your body will continue to go through many changes during pregnancy. The changes vary from woman to woman. During the third trimester:  Your weight will continue to increase. You can expect to gain 25-35 pounds (11-16 kg) by the end of the pregnancy.  You may begin to get stretch marks on your hips, abdomen, and breasts.  You may urinate more often because the fetus is moving lower into your pelvis and pressing on your bladder.  You may develop or continue to have heartburn. This is caused by increased hormones that slow down muscles in the digestive tract.  You may develop or continue to have constipation because increased hormones slow digestion and cause the muscles that push waste through your intestines to relax.  You may develop hemorrhoids. These are swollen veins (varicose veins) in the rectum that can itch or be painful.  You may develop swollen, bulging veins (varicose veins) in your legs.  You may have increased body aches in the pelvis, back, or thighs. This is due to weight gain and increased hormones that are relaxing your joints.  You may have changes in your hair. These can include thickening of your hair, rapid growth, and changes in texture. Some women also have hair loss during or after pregnancy, or hair that feels dry or thin. Your hair will most likely return to normal after your baby is born.  Your breasts will continue to grow and they will continue to become tender. A yellow fluid (colostrum) may leak from your breasts. This is the first milk you are producing for your baby.  Your belly button may stick out.  You may notice more swelling in your hands,  face, or ankles.  You may have increased tingling or numbness in your hands, arms, and legs. The skin on your belly may also feel numb.  You may feel short of breath because of your expanding uterus.  You may have more problems sleeping. This can be caused by the size of your belly, increased need to urinate, and an increase in your body's metabolism.  You may notice the fetus "dropping," or moving lower in your abdomen (lightening).  You may have increased vaginal discharge.  You may notice your joints feel loose and you may have pain around your pelvic bone.  What to expect at prenatal visits You will have prenatal exams every 2 weeks until week 36. Then you will have weekly prenatal exams. During a routine prenatal visit:  You will be weighed to make sure you and the baby are growing normally.  Your blood pressure will be taken.  Your abdomen will be measured to track your baby's growth.  The fetal heartbeat will be listened to.  Any test results from the previous visit will be discussed.  You may have a cervical check near your due date to see if your cervix has softened or thinned (effaced).  You will be tested for Group B streptococcus. This happens between 35 and 37 weeks.  Your health care provider may ask you:  What your birth plan is.  How you are feeling.  If you are feeling the baby move.  If you have had   any abnormal symptoms, such as leaking fluid, bleeding, severe headaches, or abdominal cramping.  If you are using any tobacco products, including cigarettes, chewing tobacco, and electronic cigarettes.  If you have any questions.  Other tests or screenings that may be performed during your third trimester include:  Blood tests that check for low iron levels (anemia).  Fetal testing to check the health, activity level, and growth of the fetus. Testing is done if you have certain medical conditions or if there are problems during the  pregnancy.  Nonstress test (NST). This test checks the health of your baby to make sure there are no signs of problems, such as the baby not getting enough oxygen. During this test, a belt is placed around your belly. The baby is made to move, and its heart rate is monitored during movement.  What is false labor? False labor is a condition in which you feel small, irregular tightenings of the muscles in the womb (contractions) that usually go away with rest, changing position, or drinking water. These are called Braxton Hicks contractions. Contractions may last for hours, days, or even weeks before true labor sets in. If contractions come at regular intervals, become more frequent, increase in intensity, or become painful, you should see your health care provider. What are the signs of labor?  Abdominal cramps.  Regular contractions that start at 10 minutes apart and become stronger and more frequent with time.  Contractions that start on the top of the uterus and spread down to the lower abdomen and back.  Increased pelvic pressure and dull back pain.  A watery or bloody mucus discharge that comes from the vagina.  Leaking of amniotic fluid. This is also known as your "water breaking." It could be a slow trickle or a gush. Let your health care provider know if it has a color or strange odor. If you have any of these signs, call your health care provider right away, even if it is before your due date. Follow these instructions at home: Medicines  Follow your health care provider's instructions regarding medicine use. Specific medicines may be either safe or unsafe to take during pregnancy.  Take a prenatal vitamin that contains at least 600 micrograms (mcg) of folic acid.  If you develop constipation, try taking a stool softener if your health care provider approves. Eating and drinking  Eat a balanced diet that includes fresh fruits and vegetables, whole grains, good sources of protein  such as meat, eggs, or tofu, and low-fat dairy. Your health care provider will help you determine the amount of weight gain that is right for you.  Avoid raw meat and uncooked cheese. These carry germs that can cause birth defects in the baby.  If you have low calcium intake from food, talk to your health care provider about whether you should take a daily calcium supplement.  Eat four or five small meals rather than three large meals a day.  Limit foods that are high in fat and processed sugars, such as fried and sweet foods.  To prevent constipation: ? Drink enough fluid to keep your urine clear or pale yellow. ? Eat foods that are high in fiber, such as fresh fruits and vegetables, whole grains, and beans. Activity  Exercise only as directed by your health care provider. Most women can continue their usual exercise routine during pregnancy. Try to exercise for 30 minutes at least 5 days a week. Stop exercising if you experience uterine contractions.  Avoid heavy   lifting.  Do not exercise in extreme heat or humidity, or at high altitudes.  Wear low-heel, comfortable shoes.  Practice good posture.  You may continue to have sex unless your health care provider tells you otherwise. Relieving pain and discomfort  Take frequent breaks and rest with your legs elevated if you have leg cramps or low back pain.  Take warm sitz baths to soothe any pain or discomfort caused by hemorrhoids. Use hemorrhoid cream if your health care provider approves.  Wear a good support bra to prevent discomfort from breast tenderness.  If you develop varicose veins: ? Wear support pantyhose or compression stockings as told by your healthcare provider. ? Elevate your feet for 15 minutes, 3-4 times a day. Prenatal care  Write down your questions. Take them to your prenatal visits.  Keep all your prenatal visits as told by your health care provider. This is important. Safety  Wear your seat belt at  all times when driving.  Make a list of emergency phone numbers, including numbers for family, friends, the hospital, and police and fire departments. General instructions  Avoid cat litter boxes and soil used by cats. These carry germs that can cause birth defects in the baby. If you have a cat, ask someone to clean the litter box for you.  Do not travel far distances unless it is absolutely necessary and only with the approval of your health care provider.  Do not use hot tubs, steam rooms, or saunas.  Do not drink alcohol.  Do not use any products that contain nicotine or tobacco, such as cigarettes and e-cigarettes. If you need help quitting, ask your health care provider.  Do not use any medicinal herbs or unprescribed drugs. These chemicals affect the formation and growth of the baby.  Do not douche or use tampons or scented sanitary pads.  Do not cross your legs for long periods of time.  To prepare for the arrival of your baby: ? Take prenatal classes to understand, practice, and ask questions about labor and delivery. ? Make a trial run to the hospital. ? Visit the hospital and tour the maternity area. ? Arrange for maternity or paternity leave through employers. ? Arrange for family and friends to take care of pets while you are in the hospital. ? Purchase a rear-facing car seat and make sure you know how to install it in your car. ? Pack your hospital bag. ? Prepare the baby's nursery. Make sure to remove all pillows and stuffed animals from the baby's crib to prevent suffocation.  Visit your dentist if you have not gone during your pregnancy. Use a soft toothbrush to brush your teeth and be gentle when you floss. Contact a health care provider if:  You are unsure if you are in labor or if your water has broken.  You become dizzy.  You have mild pelvic cramps, pelvic pressure, or nagging pain in your abdominal area.  You have lower back pain.  You have persistent  nausea, vomiting, or diarrhea.  You have an unusual or bad smelling vaginal discharge.  You have pain when you urinate. Get help right away if:  Your water breaks before 37 weeks.  You have regular contractions less than 5 minutes apart before 37 weeks.  You have a fever.  You are leaking fluid from your vagina.  You have spotting or bleeding from your vagina.  You have severe abdominal pain or cramping.  You have rapid weight loss or weight gain.    You have shortness of breath with chest pain.  You notice sudden or extreme swelling of your face, hands, ankles, feet, or legs.  Your baby makes fewer than 10 movements in 2 hours.  You have severe headaches that do not go away when you take medicine.  You have vision changes. Summary  The third trimester is from week 28 through week 40, months 7 through 9. The third trimester is a time when the unborn baby (fetus) is growing rapidly.  During the third trimester, your discomfort may increase as you and your baby continue to gain weight. You may have abdominal, leg, and back pain, sleeping problems, and an increased need to urinate.  During the third trimester your breasts will keep growing and they will continue to become tender. A yellow fluid (colostrum) may leak from your breasts. This is the first milk you are producing for your baby.  False labor is a condition in which you feel small, irregular tightenings of the muscles in the womb (contractions) that eventually go away. These are called Braxton Hicks contractions. Contractions may last for hours, days, or even weeks before true labor sets in.  Signs of labor can include: abdominal cramps; regular contractions that start at 10 minutes apart and become stronger and more frequent with time; watery or bloody mucus discharge that comes from the vagina; increased pelvic pressure and dull back pain; and leaking of amniotic fluid. This information is not intended to replace advice  given to you by your health care provider. Make sure you discuss any questions you have with your health care provider. Document Released: 08/06/2001 Document Revised: 01/18/2016 Document Reviewed: 10/13/2012 Elsevier Interactive Patient Education  2017 [employer]  

## 2018-08-08 LAB — 28 WEEK RH+PANEL
BASOS: 0 %
Basophils Absolute: 0 10*3/uL (ref 0.0–0.2)
EOS (ABSOLUTE): 0.2 10*3/uL (ref 0.0–0.4)
EOS: 2 %
GESTATIONAL DIABETES SCREEN: 96 mg/dL (ref 65–139)
HEMOGLOBIN: 10.6 g/dL — AB (ref 11.1–15.9)
HIV Screen 4th Generation wRfx: NONREACTIVE
Hematocrit: 31 % — ABNORMAL LOW (ref 34.0–46.6)
IMMATURE GRANS (ABS): 0.1 10*3/uL (ref 0.0–0.1)
IMMATURE GRANULOCYTES: 1 %
LYMPHS: 16 %
Lymphocytes Absolute: 1.3 10*3/uL (ref 0.7–3.1)
MCH: 27 pg (ref 26.6–33.0)
MCHC: 34.2 g/dL (ref 31.5–35.7)
MCV: 79 fL (ref 79–97)
Monocytes Absolute: 0.5 10*3/uL (ref 0.1–0.9)
Monocytes: 6 %
NEUTROS ABS: 6.1 10*3/uL (ref 1.4–7.0)
NEUTROS PCT: 75 %
Platelets: 156 10*3/uL (ref 150–450)
RBC: 3.93 x10E6/uL (ref 3.77–5.28)
RDW: 14.2 % (ref 12.3–15.4)
RPR: NONREACTIVE
WBC: 8.2 10*3/uL (ref 3.4–10.8)

## 2018-08-21 ENCOUNTER — Ambulatory Visit (INDEPENDENT_AMBULATORY_CARE_PROVIDER_SITE_OTHER): Payer: 59 | Admitting: Obstetrics and Gynecology

## 2018-08-21 VITALS — BP 120/80 | Wt 152.0 lb

## 2018-08-21 DIAGNOSIS — Z3480 Encounter for supervision of other normal pregnancy, unspecified trimester: Secondary | ICD-10-CM

## 2018-08-21 DIAGNOSIS — Z3A29 29 weeks gestation of pregnancy: Secondary | ICD-10-CM

## 2018-08-21 DIAGNOSIS — O4442 Low lying placenta NOS or without hemorrhage, second trimester: Secondary | ICD-10-CM

## 2018-08-21 DIAGNOSIS — O4440 Low lying placenta NOS or without hemorrhage, unspecified trimester: Secondary | ICD-10-CM

## 2018-08-21 LAB — POCT URINALYSIS DIPSTICK OB
Glucose, UA: NEGATIVE
POC,PROTEIN,UA: NEGATIVE

## 2018-08-21 MED ORDER — BREAST PUMP MISC: 0 refills | Status: DC

## 2018-08-21 NOTE — Patient Instructions (Signed)
Labor and Delivery [phone]

## 2018-08-21 NOTE — Progress Notes (Signed)
Routine Prenatal Care Visit  Subjective  [name] is a 22 y.o. G2P1001 at 66w0dbeing seen today for ongoing prenatal care.  She is currently monitored for the following issues for this low-risk pregnancy and has Supervision of other normal pregnancy, antepartum and Low-lying placenta on their problem list.  ----------------------------------------------------------------------------------- Patient reports no complaints.   Contractions: Not present. Vag. Bleeding: None.  Movement: Present. Denies leaking of fluid.  ----------------------------------------------------------------------------------- The following portions of the patient's history were reviewed and updated as appropriate: allergies, current medications, past family history, past medical history, past social history, past surgical history and problem list. Problem list updated.   Objective  Blood pressure 120/80, weight 152 lb (68.9 kg), last menstrual period 01/30/2018, not currently breastfeeding. Pregravid weight 138 lb (62.6 kg) Total Weight Gain 14 lb (6.35 kg) Urinalysis:      Fetal Status: Fetal Heart Rate (bpm): 140 Fundal Height: 39 cm Movement: Present     General:  Alert, oriented and cooperative. Patient is in no acute distress.  Skin: Skin is warm and dry. No rash noted.   Cardiovascular: Normal heart rate noted  Respiratory: Normal respiratory effort, no problems with respiration noted  Abdomen: Soft, gravid, appropriate for gestational age. Pain/Pressure: Present     Pelvic:  Cervical exam deferred        Extremities: Normal range of motion.     ental Status: Normal mood and affect. Normal behavior. Normal judgment and thought content.   Immunization History  Administered Date(s) Administered  . DTaP 10/16/1995, 12/29/1995, 02/24/1996, 01/21/1997, 08/24/1999  . HPV 9-valent 07/30/2017  . Hepatitis A 03/12/2012, 02/23/2013  . Hepatitis B 08/05/1995, 10/16/1995, 02/24/1996  . HiB (PRP-OMP)  10/16/1995, 12/29/1995, 02/24/1996  . IPV 10/16/1995, 12/29/1995, 01/21/1997, 08/24/1999  . Influenza,inj,Quad PF,6+ Mos 07/29/2017  . MMR 08/04/1996, 08/24/1999  . Meningococcal Conjugate 09/22/2006, 02/23/2013  . Tdap 09/15/2006, 05/12/2015, 11/19/2016  . Varicella 08/04/1996, 02/11/2006     Assessment   22 y.o. G2P1001 at 245w0dy  11/06/2018, by Last Menstrual Period presenting for routine prenatal visit  Plan   July 2019 Pregnancy Problems (from 03/09/18 to present)    Problem Noted Resolved   Low-lying placenta 06/12/2018 by JaWill BonnetMD No   Overview Signed 08/31/2018  1:15 PM by StMalachy MoodMD    _0  Resolved 28 week follow up      Supervision of other normal pregnancy, antepartum 03/25/2018 by ScRexene AgentCNM No   Overview Addendum 08/31/2018  1:18 PM by StMalachy MoodMD    Clinic [facility] Prenatal Labs  Dating LMP = 6 week USKorealood type: AB/Positive/-- (08/23 1501)   Genetic Screen NIPS: Normal XY Antibody:Negative (08/23 1501)  Anatomic USK[place]ormal, low lying placenta (resolved), small BPD on follow up growth Rubella: 18.60 (08/23 1501) Varicella: Immune  GTT 96 RPR: Non Reactive (12/13 1618)   Rhogam N/A HBsAg: Negative (08/23 1501)   TDaP vaccine                       Flu Shot: HIV: Non Reactive (12/13 1618)   Baby Food                                GBS:   Contraception  Pap: 03/20/2018, NILM  CBB     CS/VBAC N/A   Support Person Mom [name]  Anxiety and depression 04/17/2018 by Will Bonnet, MD 07/10/2018 by Rod Can, CNM       Gestational age appropriate obstetric precautions including but not limited to vaginal bleeding, contractions, leaking of fluid and fetal movement were reviewed in detail with the patient.    - follow up growth to check on BPD ordered  Return in about 2 weeks (around 09/04/2018) for ROB and growth scan.  Malachy Mood, MD, [facility], [facility] 08/21/2018, 3:58 PM

## 2018-08-26 NOTE — L&D Delivery Note (Signed)
Vaginal Delivery Note  Spontaneous delivery of live viable female infant from the ROP position through an intact  perineum. Delivery of anterior left shoulder with gentle downward guidance followed by delivery of the right posterior shoulder with gentle upward guidance. Body followed spontaneously. Infant placed on maternal chest. Nursery present and helped with neonatal resuscitation and evaluation. Cord clamped and cut after one minute. Cord blood not collected. Placenta delivered spontaneously and intact with a 3 vessel cord.  2nd degree laceration. Uterus firm and below umbilicus at the end of the delivery.  Mom and baby recovering in stable condition. Sponge and needle counts were correct at the end of the delivery.  APGARS 1 minute: 9   5 minute: 9 Weight: pending Name: Veto Kemps MD [facility], [facility] 11/06/18 5:50 AM

## 2018-09-04 ENCOUNTER — Encounter: Payer: Self-pay | Admitting: Maternal Newborn

## 2018-09-04 ENCOUNTER — Ambulatory Visit (INDEPENDENT_AMBULATORY_CARE_PROVIDER_SITE_OTHER): Payer: 59 | Admitting: Maternal Newborn

## 2018-09-04 ENCOUNTER — Ambulatory Visit (INDEPENDENT_AMBULATORY_CARE_PROVIDER_SITE_OTHER): Payer: 59

## 2018-09-04 VITALS — BP 110/50 | Wt 153.0 lb

## 2018-09-04 DIAGNOSIS — Z3480 Encounter for supervision of other normal pregnancy, unspecified trimester: Secondary | ICD-10-CM

## 2018-09-04 DIAGNOSIS — O43119 Circumvallate placenta, unspecified trimester: Secondary | ICD-10-CM | POA: Insufficient documentation

## 2018-09-04 DIAGNOSIS — Z362 Encounter for other antenatal screening follow-up: Secondary | ICD-10-CM

## 2018-09-04 DIAGNOSIS — Z23 Encounter for immunization: Secondary | ICD-10-CM | POA: Diagnosis not present

## 2018-09-04 DIAGNOSIS — Z3A31 31 weeks gestation of pregnancy: Secondary | ICD-10-CM

## 2018-09-04 DIAGNOSIS — Z3A29 29 weeks gestation of pregnancy: Secondary | ICD-10-CM

## 2018-09-04 DIAGNOSIS — Z3689 Encounter for other specified antenatal screening: Secondary | ICD-10-CM

## 2018-09-04 NOTE — Progress Notes (Signed)
Routine Prenatal Care Visit  Subjective  [name] is a 23 y.o. G2P1001 at 4526w0d being seen today for ongoing prenatal care.  She is currently monitored for the following issues for this low-risk pregnancy and has Supervision of other normal pregnancy, antepartum and Circumvallate placenta, unspecified trimester on their problem list.  ----------------------------------------------------------------------------------- Patient reports no complaints.   Contractions: Not present. Vag. Bleeding: None.  Movement: Present. No leaking of fluid.  ----------------------------------------------------------------------------------- The following portions of the patient's history were reviewed and updated as appropriate: allergies, current medications, past family history, past medical history, past social history, past surgical history and problem list. Problem list updated.  Objective  Blood pressure (!) 110/50, weight 153 lb (69.4 kg), last menstrual period 01/30/2018, not currently breastfeeding. Pregravid weight 138 lb (62.6 kg) Total Weight Gain 15 lb (6.804 kg) Body mass index is 21.95 kg/m.   Fetal Status: Fetal Heart Rate (bpm): 175   Movement: Present     General:  Alert, oriented and cooperative. Patient is in no acute distress.  Skin: Skin is warm and dry. No rash noted.   Cardiovascular: Normal heart rate noted  Respiratory: Normal respiratory effort, no problems with respiration noted  Abdomen: Soft, gravid, appropriate for gestational age. Pain/Pressure: Present     Pelvic:  Cervical exam deferred        Extremities: Normal range of motion.  Edema: None  Mental Status: Normal mood and affect. Normal behavior. Normal judgment and thought content.     Assessment   23 y.o. G2P1001 at 6626w0d, EDD 11/06/2018 by Last Menstrual Period presenting for a routine prenatal visit.  Plan   July 2019 Pregnancy Problems (from 03/09/18 to present)    Problem Noted Resolved   Low-lying placenta 06/12/2018 by Conard NovakJackson, Stephen D, MD No   Overview Signed 08/31/2018  1:15 PM by Vena AustriaStaebler, Andreas, MD    [X]  Resolved 28 week follow up      Supervision of other normal pregnancy, antepartum 03/25/2018 by Oswaldo ConroySchmid, Bernell Haynie Y, CNM No   Overview Addendum 08/31/2018  1:18 PM by Vena AustriaStaebler, Andreas, MD    Clinic [facility] Prenatal Labs  Dating LMP = 6 week US Blood type: AB/Positive/-- (08/23 1501)   Genetic Screen NIPS: Normal XY Antibody:Negative (08/23 1501)  Anatomic US Normal, low lying placenta (resolved), small BPD on follow up growth Rubella: 18.60 (08/23 1501) Varicella: Immune  GTT 96 RPR: Non Reactive (12/13 1618)   Rhogam N/A HBsAg: Negative (08/23 1501)   TDaP vaccine                       Flu Shot: HIV: Non Reactive (12/13 1618)   Baby Food                                GBS:   Contraception  Pap: 03/20/2018, NILM  CBB     CS/VBAC N/A   Support Person Mom [name]             Anxiety and depression 04/17/2018 by Conard NovakJackson, Stephen D, MD 07/10/2018 by Tresea MallGledhill, Jane, CNM    Growth scan today shows growth at the 32nd percentile, EFW 3 lb, 4 oz. BPD again measuring small at 3rd percentile. Follow up growth scan in 4 weeks.  Currently taking Fusion Plus iron supplements, would like repeat H&H next visit to see if she can discontinue supplemental iron.  Please refer to After Visit Summary for  other counseling recommendations.    Return in about 2 weeks (around 09/18/2018) for ROB.  Marcelyn BruinsJacelyn Tabbetha Kutscher, CNM 09/04/2018  4:59 PM

## 2018-09-04 NOTE — Progress Notes (Signed)
ROB/U/S No concerns Flu shot today  

## 2018-09-04 NOTE — Patient Instructions (Signed)
Third Trimester of Pregnancy The third trimester is from week 28 through week 40 (months 7 through 9). The third trimester is a time when the unborn baby (fetus) is growing rapidly. At the end of the ninth month, the fetus is about 20 inches in length and weighs 6-10 pounds. Body changes during your third trimester Your body will continue to go through many changes during pregnancy. The changes vary from woman to woman. During the third trimester:  Your weight will continue to increase. You can expect to gain 25-35 pounds (11-16 kg) by the end of the pregnancy.  You may begin to get stretch marks on your hips, abdomen, and breasts.  You may urinate more often because the fetus is moving lower into your pelvis and pressing on your bladder.  You may develop or continue to have heartburn. This is caused by increased hormones that slow down muscles in the digestive tract.  You may develop or continue to have constipation because increased hormones slow digestion and cause the muscles that push waste through your intestines to relax.  You may develop hemorrhoids. These are swollen veins (varicose veins) in the rectum that can itch or be painful.  You may develop swollen, bulging veins (varicose veins) in your legs.  You may have increased body aches in the pelvis, back, or thighs. This is due to weight gain and increased hormones that are relaxing your joints.  You may have changes in your hair. These can include thickening of your hair, rapid growth, and changes in texture. Some women also have hair loss during or after pregnancy, or hair that feels dry or thin. Your hair will most likely return to normal after your baby is born.  Your breasts will continue to grow and they will continue to become tender. A yellow fluid (colostrum) may leak from your breasts. This is the first milk you are producing for your baby.  Your belly button may stick out.  You may notice more swelling in your hands,  face, or ankles.  You may have increased tingling or numbness in your hands, arms, and legs. The skin on your belly may also feel numb.  You may feel short of breath because of your expanding uterus.  You may have more problems sleeping. This can be caused by the size of your belly, increased need to urinate, and an increase in your body's metabolism.  You may notice the fetus "dropping," or moving lower in your abdomen (lightening).  You may have increased vaginal discharge.  You may notice your joints feel loose and you may have pain around your pelvic bone. What to expect at prenatal visits You will have prenatal exams every 2 weeks until week 36. Then you will have weekly prenatal exams. During a routine prenatal visit:  You will be weighed to make sure you and the baby are growing normally.  Your blood pressure will be taken.  Your abdomen will be measured to track your baby's growth.  The fetal heartbeat will be listened to.  Any test results from the previous visit will be discussed.  You may have a cervical check near your due date to see if your cervix has softened or thinned (effaced).  You will be tested for Group B streptococcus. This happens between 35 and 37 weeks. Your health care provider may ask you:  What your birth plan is.  How you are feeling.  If you are feeling the baby move.  If you have had any abnormal   symptoms, such as leaking fluid, bleeding, severe headaches, or abdominal cramping.  If you are using any tobacco products, including cigarettes, chewing tobacco, and electronic cigarettes.  If you have any questions. Other tests or screenings that may be performed during your third trimester include:  Blood tests that check for low iron levels (anemia).  Fetal testing to check the health, activity level, and growth of the fetus. Testing is done if you have certain medical conditions or if there are problems during the pregnancy.  Nonstress test  (NST). This test checks the health of your baby to make sure there are no signs of problems, such as the baby not getting enough oxygen. During this test, a belt is placed around your belly. The baby is made to move, and its heart rate is monitored during movement. What is false labor? False labor is a condition in which you feel small, irregular tightenings of the muscles in the womb (contractions) that usually go away with rest, changing position, or drinking water. These are called Braxton Hicks contractions. Contractions may last for hours, days, or even weeks before true labor sets in. If contractions come at regular intervals, become more frequent, increase in intensity, or become painful, you should see your health care provider. What are the signs of labor?  Abdominal cramps.  Regular contractions that start at 10 minutes apart and become stronger and more frequent with time.  Contractions that start on the top of the uterus and spread down to the lower abdomen and back.  Increased pelvic pressure and dull back pain.  A watery or bloody mucus discharge that comes from the vagina.  Leaking of amniotic fluid. This is also known as your "water breaking." It could be a slow trickle or a gush. Let your health care provider know if it has a color or strange odor. If you have any of these signs, call your health care provider right away, even if it is before your due date. Follow these instructions at home: Medicines  Follow your health care provider's instructions regarding medicine use. Specific medicines may be either safe or unsafe to take during pregnancy.  Take a prenatal vitamin that contains at least 600 micrograms (mcg) of folic acid.  If you develop constipation, try taking a stool softener if your health care provider approves. Eating and drinking   Eat a balanced diet that includes fresh fruits and vegetables, whole grains, good sources of protein such as meat, eggs, or tofu,  and low-fat dairy. Your health care provider will help you determine the amount of weight gain that is right for you.  Avoid raw meat and uncooked cheese. These carry germs that can cause birth defects in the baby.  If you have low calcium intake from food, talk to your health care provider about whether you should take a daily calcium supplement.  Eat four or five small meals rather than three large meals a day.  Limit foods that are high in fat and processed sugars, such as fried and sweet foods.  To prevent constipation: ? Drink enough fluid to keep your urine clear or pale yellow. ? Eat foods that are high in fiber, such as fresh fruits and vegetables, whole grains, and beans. Activity  Exercise only as directed by your health care provider. Most women can continue their usual exercise routine during pregnancy. Try to exercise for 30 minutes at least 5 days a week. Stop exercising if you experience uterine contractions.  Avoid heavy lifting.  Do   not exercise in extreme heat or humidity, or at high altitudes.  Wear low-heel, comfortable shoes.  Practice good posture.  You may continue to have sex unless your health care provider tells you otherwise. Relieving pain and discomfort  Take frequent breaks and rest with your legs elevated if you have leg cramps or low back pain.  Take warm sitz baths to soothe any pain or discomfort caused by hemorrhoids. Use hemorrhoid cream if your health care provider approves.  Wear a good support bra to prevent discomfort from breast tenderness.  If you develop varicose veins: ? Wear support pantyhose or compression stockings as told by your healthcare provider. ? Elevate your feet for 15 minutes, 3-4 times a day. Prenatal care  Write down your questions. Take them to your prenatal visits.  Keep all your prenatal visits as told by your health care provider. This is important. Safety  Wear your seat belt at all times when driving.  Make  a list of emergency phone numbers, including numbers for family, friends, the hospital, and police and fire departments. General instructions  Avoid cat litter boxes and soil used by cats. These carry germs that can cause birth defects in the baby. If you have a cat, ask someone to clean the litter box for you.  Do not travel far distances unless it is absolutely necessary and only with the approval of your health care provider.  Do not use hot tubs, steam rooms, or saunas.  Do not drink alcohol.  Do not use any products that contain nicotine or tobacco, such as cigarettes and e-cigarettes. If you need help quitting, ask your health care provider.  Do not use any medicinal herbs or unprescribed drugs. These chemicals affect the formation and growth of the baby.  Do not douche or use tampons or scented sanitary pads.  Do not cross your legs for long periods of time.  To prepare for the arrival of your baby: ? Take prenatal classes to understand, practice, and ask questions about labor and delivery. ? Make a trial run to the hospital. ? Visit the hospital and tour the maternity area. ? Arrange for maternity or paternity leave through employers. ? Arrange for family and friends to take care of pets while you are in the hospital. ? Purchase a rear-facing car seat and make sure you know how to install it in your car. ? Pack your hospital bag. ? Prepare the baby's nursery. Make sure to remove all pillows and stuffed animals from the baby's crib to prevent suffocation.  Visit your dentist if you have not gone during your pregnancy. Use a soft toothbrush to brush your teeth and be gentle when you floss. Contact a health care provider if:  You are unsure if you are in labor or if your water has broken.  You become dizzy.  You have mild pelvic cramps, pelvic pressure, or nagging pain in your abdominal area.  You have lower back pain.  You have persistent nausea, vomiting, or  diarrhea.  You have an unusual or bad smelling vaginal discharge.  You have pain when you urinate. Get help right away if:  Your water breaks before 37 weeks.  You have regular contractions less than 5 minutes apart before 37 weeks.  You have a fever.  You are leaking fluid from your vagina.  You have spotting or bleeding from your vagina.  You have severe abdominal pain or cramping.  You have rapid weight loss or weight gain.  You have   shortness of breath with chest pain.  You notice sudden or extreme swelling of your face, hands, ankles, feet, or legs.  Your baby makes fewer than 10 movements in 2 hours.  You have severe headaches that do not go away when you take medicine.  You have vision changes. Summary  The third trimester is from week 28 through week 40, months 7 through 9. The third trimester is a time when the unborn baby (fetus) is growing rapidly.  During the third trimester, your discomfort may increase as you and your baby continue to gain weight. You may have abdominal, leg, and back pain, sleeping problems, and an increased need to urinate.  During the third trimester your breasts will keep growing and they will continue to become tender. A yellow fluid (colostrum) may leak from your breasts. This is the first milk you are producing for your baby.  False labor is a condition in which you feel small, irregular tightenings of the muscles in the womb (contractions) that eventually go away. These are called Braxton Hicks contractions. Contractions may last for hours, days, or even weeks before true labor sets in.  Signs of labor can include: abdominal cramps; regular contractions that start at 10 minutes apart and become stronger and more frequent with time; watery or bloody mucus discharge that comes from the vagina; increased pelvic pressure and dull back pain; and leaking of amniotic fluid. This information is not intended to replace advice given to you by your  health care provider. Make sure you discuss any questions you have with your health care provider. Document Released: 08/06/2001 Document Revised: 09/17/2016 Document Reviewed: 09/17/2016 [employer] Interactive Patient Education  2019 [employer]  

## 2018-09-18 ENCOUNTER — Ambulatory Visit (INDEPENDENT_AMBULATORY_CARE_PROVIDER_SITE_OTHER): Payer: 59 | Admitting: Maternal Newborn

## 2018-09-18 VITALS — BP 112/60 | Wt 159.0 lb

## 2018-09-18 DIAGNOSIS — Z3480 Encounter for supervision of other normal pregnancy, unspecified trimester: Secondary | ICD-10-CM

## 2018-09-18 DIAGNOSIS — Z3483 Encounter for supervision of other normal pregnancy, third trimester: Secondary | ICD-10-CM

## 2018-09-18 DIAGNOSIS — Z3A33 33 weeks gestation of pregnancy: Secondary | ICD-10-CM

## 2018-09-18 LAB — POCT URINALYSIS DIPSTICK OB
Glucose, UA: NEGATIVE
POC,PROTEIN,UA: NEGATIVE

## 2018-09-18 NOTE — Progress Notes (Signed)
Routine Prenatal Care Visit  Subjective  [name] is a 23 y.o. G2P1001 at 6529w0d being seen today for ongoing prenatal care.  She is currently monitored for the following issues for this low-risk pregnancy and has Supervision of other normal pregnancy, antepartum and Circumvallate placenta, unspecified trimester on their problem list.  ----------------------------------------------------------------------------------- Patient reports occasional Braxton Hicks contractions.   Contractions: Not present. Vag. Bleeding: None.  Movement: Present. No leaking of fluid.  ----------------------------------------------------------------------------------- The following portions of the patient's history were reviewed and updated as appropriate: allergies, current medications, past family history, past medical history, past social history, past surgical history and problem list. Problem list updated.  Objective  Blood pressure 112/60, weight 159 lb (72.1 kg), last menstrual period 01/30/2018, not currently breastfeeding.  Pregravid weight 138 lb (62.6 kg) Total Weight Gain 21 lb (9.526 kg)  Body mass index is 22.81 kg/m.   Urinalysis: Urine dipstick shows negative for glucose, protein.  Fetal Status: Fetal Heart Rate (bpm): 154 Fundal Height: 32 cm Movement: Present     General:  Alert, oriented and cooperative. Patient is in no acute distress.  Skin: Skin is warm and dry. No rash noted.   Cardiovascular: Normal heart rate noted  Respiratory: Normal respiratory effort, no problems with respiration noted  Abdomen: Soft, gravid, appropriate for gestational age. Pain/Pressure: Present     Pelvic:  Cervical exam deferred        Extremities: Normal range of motion.  Edema: None  Mental Status: Normal mood and affect. Normal behavior. Normal judgment and thought content.    Assessment   23 y.o. G2P1001 at 6129w0d, EDD 11/06/2018 by Last Menstrual Period presenting for a routine prenatal  visit.  Plan   July 2019 Pregnancy Problems (from 03/09/18 to present)    Problem Noted Resolved   Supervision of other normal pregnancy, antepartum 03/25/2018 by Oswaldo ConroySchmid, Jacelyn Y, CNM No   Overview Addendum 08/31/2018  1:18 PM by Vena AustriaStaebler, Andreas, MD    Clinic [facility] Prenatal Labs  Dating LMP = 6 week US Blood type: AB/Positive/-- (08/23 1501)   Genetic Screen NIPS: Normal XY Antibody:Negative (08/23 1501)  Anatomic US Normal, low lying placenta (resolved), small BPD on follow up growth Rubella: 18.60 (08/23 1501) Varicella: Immune  GTT 96 RPR: Non Reactive (12/13 1618)   Rhogam N/A HBsAg: Negative (08/23 1501)   TDaP vaccine                       Flu Shot: HIV: Non Reactive (12/13 1618)   Baby Food                                GBS:   Contraception  Pap: 03/20/2018, NILM  CBB     CS/VBAC N/A   Support Person Mom [name]             Low-lying placenta 06/12/2018 by Conard NovakJackson, Stephen D, MD 09/04/2018 by Oswaldo ConroySchmid, Jacelyn Y, CNM   Overview Signed 08/31/2018  1:15 PM by Vena AustriaStaebler, Andreas, MD    [X]  Resolved 28 week follow up      Anxiety and depression 04/17/2018 by Conard NovakJackson, Stephen D, MD 07/10/2018 by Tresea MallGledhill, Jane, CNM    Check H&H next visit. She is taking iron supplements, fingerstick Hgb today 10.5.  Preterm labor symptoms and general obstetric precautions including but not limited to vaginal bleeding, contractions, leaking of fluid and fetal movement were reviewed.  Please refer to After Visit Summary for other counseling recommendations.   Return in about 2 weeks (around 10/02/2018) for ROB and growth scan.  Marcelyn BruinsJacelyn Schmid, CNM 09/18/2018

## 2018-09-18 NOTE — Progress Notes (Signed)
ROB- no concerns 

## 2018-09-21 ENCOUNTER — Telehealth: Payer: Self-pay

## 2018-09-21 ENCOUNTER — Encounter: Payer: Self-pay | Admitting: Maternal Newborn

## 2018-09-21 NOTE — Patient Instructions (Signed)
Third Trimester of Pregnancy The third trimester is from week 28 through week 40 (months 7 through 9). The third trimester is a time when the unborn baby (fetus) is growing rapidly. At the end of the ninth month, the fetus is about 20 inches in length and weighs 6-10 pounds. Body changes during your third trimester Your body will continue to go through many changes during pregnancy. The changes vary from woman to woman. During the third trimester:  Your weight will continue to increase. You can expect to gain 25-35 pounds (11-16 kg) by the end of the pregnancy.  You may begin to get stretch marks on your hips, abdomen, and breasts.  You may urinate more often because the fetus is moving lower into your pelvis and pressing on your bladder.  You may develop or continue to have heartburn. This is caused by increased hormones that slow down muscles in the digestive tract.  You may develop or continue to have constipation because increased hormones slow digestion and cause the muscles that push waste through your intestines to relax.  You may develop hemorrhoids. These are swollen veins (varicose veins) in the rectum that can itch or be painful.  You may develop swollen, bulging veins (varicose veins) in your legs.  You may have increased body aches in the pelvis, back, or thighs. This is due to weight gain and increased hormones that are relaxing your joints.  You may have changes in your hair. These can include thickening of your hair, rapid growth, and changes in texture. Some women also have hair loss during or after pregnancy, or hair that feels dry or thin. Your hair will most likely return to normal after your baby is born.  Your breasts will continue to grow and they will continue to become tender. A yellow fluid (colostrum) may leak from your breasts. This is the first milk you are producing for your baby.  Your belly button may stick out.  You may notice more swelling in your hands,  face, or ankles.  You may have increased tingling or numbness in your hands, arms, and legs. The skin on your belly may also feel numb.  You may feel short of breath because of your expanding uterus.  You may have more problems sleeping. This can be caused by the size of your belly, increased need to urinate, and an increase in your body's metabolism.  You may notice the fetus "dropping," or moving lower in your abdomen (lightening).  You may have increased vaginal discharge.  You may notice your joints feel loose and you may have pain around your pelvic bone. What to expect at prenatal visits You will have prenatal exams every 2 weeks until week 36. Then you will have weekly prenatal exams. During a routine prenatal visit:  You will be weighed to make sure you and the baby are growing normally.  Your blood pressure will be taken.  Your abdomen will be measured to track your baby's growth.  The fetal heartbeat will be listened to.  Any test results from the previous visit will be discussed.  You may have a cervical check near your due date to see if your cervix has softened or thinned (effaced).  You will be tested for Group B streptococcus. This happens between 35 and 37 weeks. Your health care provider may ask you:  What your birth plan is.  How you are feeling.  If you are feeling the baby move.  If you have had any abnormal   symptoms, such as leaking fluid, bleeding, severe headaches, or abdominal cramping.  If you are using any tobacco products, including cigarettes, chewing tobacco, and electronic cigarettes.  If you have any questions. Other tests or screenings that may be performed during your third trimester include:  Blood tests that check for low iron levels (anemia).  Fetal testing to check the health, activity level, and growth of the fetus. Testing is done if you have certain medical conditions or if there are problems during the pregnancy.  Nonstress test  (NST). This test checks the health of your baby to make sure there are no signs of problems, such as the baby not getting enough oxygen. During this test, a belt is placed around your belly. The baby is made to move, and its heart rate is monitored during movement. What is false labor? False labor is a condition in which you feel small, irregular tightenings of the muscles in the womb (contractions) that usually go away with rest, changing position, or drinking water. These are called Braxton Hicks contractions. Contractions may last for hours, days, or even weeks before true labor sets in. If contractions come at regular intervals, become more frequent, increase in intensity, or become painful, you should see your health care provider. What are the signs of labor?  Abdominal cramps.  Regular contractions that start at 10 minutes apart and become stronger and more frequent with time.  Contractions that start on the top of the uterus and spread down to the lower abdomen and back.  Increased pelvic pressure and dull back pain.  A watery or bloody mucus discharge that comes from the vagina.  Leaking of amniotic fluid. This is also known as your "water breaking." It could be a slow trickle or a gush. Let your health care provider know if it has a color or strange odor. If you have any of these signs, call your health care provider right away, even if it is before your due date. Follow these instructions at home: Medicines  Follow your health care provider's instructions regarding medicine use. Specific medicines may be either safe or unsafe to take during pregnancy.  Take a prenatal vitamin that contains at least 600 micrograms (mcg) of folic acid.  If you develop constipation, try taking a stool softener if your health care provider approves. Eating and drinking   Eat a balanced diet that includes fresh fruits and vegetables, whole grains, good sources of protein such as meat, eggs, or tofu,  and low-fat dairy. Your health care provider will help you determine the amount of weight gain that is right for you.  Avoid raw meat and uncooked cheese. These carry germs that can cause birth defects in the baby.  If you have low calcium intake from food, talk to your health care provider about whether you should take a daily calcium supplement.  Eat four or five small meals rather than three large meals a day.  Limit foods that are high in fat and processed sugars, such as fried and sweet foods.  To prevent constipation: ? Drink enough fluid to keep your urine clear or pale yellow. ? Eat foods that are high in fiber, such as fresh fruits and vegetables, whole grains, and beans. Activity  Exercise only as directed by your health care provider. Most women can continue their usual exercise routine during pregnancy. Try to exercise for 30 minutes at least 5 days a week. Stop exercising if you experience uterine contractions.  Avoid heavy lifting.  Do   not exercise in extreme heat or humidity, or at high altitudes.  Wear low-heel, comfortable shoes.  Practice good posture.  You may continue to have sex unless your health care provider tells you otherwise. Relieving pain and discomfort  Take frequent breaks and rest with your legs elevated if you have leg cramps or low back pain.  Take warm sitz baths to soothe any pain or discomfort caused by hemorrhoids. Use hemorrhoid cream if your health care provider approves.  Wear a good support bra to prevent discomfort from breast tenderness.  If you develop varicose veins: ? Wear support pantyhose or compression stockings as told by your healthcare provider. ? Elevate your feet for 15 minutes, 3-4 times a day. Prenatal care  Write down your questions. Take them to your prenatal visits.  Keep all your prenatal visits as told by your health care provider. This is important. Safety  Wear your seat belt at all times when driving.  Make  a list of emergency phone numbers, including numbers for family, friends, the hospital, and police and fire departments. General instructions  Avoid cat litter boxes and soil used by cats. These carry germs that can cause birth defects in the baby. If you have a cat, ask someone to clean the litter box for you.  Do not travel far distances unless it is absolutely necessary and only with the approval of your health care provider.  Do not use hot tubs, steam rooms, or saunas.  Do not drink alcohol.  Do not use any products that contain nicotine or tobacco, such as cigarettes and e-cigarettes. If you need help quitting, ask your health care provider.  Do not use any medicinal herbs or unprescribed drugs. These chemicals affect the formation and growth of the baby.  Do not douche or use tampons or scented sanitary pads.  Do not cross your legs for long periods of time.  To prepare for the arrival of your baby: ? Take prenatal classes to understand, practice, and ask questions about labor and delivery. ? Make a trial run to the hospital. ? Visit the hospital and tour the maternity area. ? Arrange for maternity or paternity leave through employers. ? Arrange for family and friends to take care of pets while you are in the hospital. ? Purchase a rear-facing car seat and make sure you know how to install it in your car. ? Pack your hospital bag. ? Prepare the baby's nursery. Make sure to remove all pillows and stuffed animals from the baby's crib to prevent suffocation.  Visit your dentist if you have not gone during your pregnancy. Use a soft toothbrush to brush your teeth and be gentle when you floss. Contact a health care provider if:  You are unsure if you are in labor or if your water has broken.  You become dizzy.  You have mild pelvic cramps, pelvic pressure, or nagging pain in your abdominal area.  You have lower back pain.  You have persistent nausea, vomiting, or  diarrhea.  You have an unusual or bad smelling vaginal discharge.  You have pain when you urinate. Get help right away if:  Your water breaks before 37 weeks.  You have regular contractions less than 5 minutes apart before 37 weeks.  You have a fever.  You are leaking fluid from your vagina.  You have spotting or bleeding from your vagina.  You have severe abdominal pain or cramping.  You have rapid weight loss or weight gain.  You have   shortness of breath with chest pain.  You notice sudden or extreme swelling of your face, hands, ankles, feet, or legs.  Your baby makes fewer than 10 movements in 2 hours.  You have severe headaches that do not go away when you take medicine.  You have vision changes. Summary  The third trimester is from week 28 through week 40, months 7 through 9. The third trimester is a time when the unborn baby (fetus) is growing rapidly.  During the third trimester, your discomfort may increase as you and your baby continue to gain weight. You may have abdominal, leg, and back pain, sleeping problems, and an increased need to urinate.  During the third trimester your breasts will keep growing and they will continue to become tender. A yellow fluid (colostrum) may leak from your breasts. This is the first milk you are producing for your baby.  False labor is a condition in which you feel small, irregular tightenings of the muscles in the womb (contractions) that eventually go away. These are called Braxton Hicks contractions. Contractions may last for hours, days, or even weeks before true labor sets in.  Signs of labor can include: abdominal cramps; regular contractions that start at 10 minutes apart and become stronger and more frequent with time; watery or bloody mucus discharge that comes from the vagina; increased pelvic pressure and dull back pain; and leaking of amniotic fluid. This information is not intended to replace advice given to you by your  health care provider. Make sure you discuss any questions you have with your health care provider. Document Released: 08/06/2001 Document Revised: 09/17/2016 Document Reviewed: 09/17/2016 [employer] Interactive Patient Education  2019 [employer]  

## 2018-09-21 NOTE — Telephone Encounter (Signed)
Pt called after hour nurse 09/19/2018 at 3:47pm c/o SOB and light headedness.  After hour nurse adv pt to go to ED/L&D which according to pt's chart she did not do.  Tried to reach pt. LMTC.

## 2018-09-21 NOTE — Telephone Encounter (Signed)
Pt returned call. She did not got to ED/L&D d/t childcare and flu restrictions.  She rested and drank water.  Sxs resolved.

## 2018-10-02 ENCOUNTER — Encounter: Payer: Self-pay | Admitting: Maternal Newborn

## 2018-10-02 ENCOUNTER — Ambulatory Visit (INDEPENDENT_AMBULATORY_CARE_PROVIDER_SITE_OTHER): Payer: 59

## 2018-10-02 ENCOUNTER — Ambulatory Visit (INDEPENDENT_AMBULATORY_CARE_PROVIDER_SITE_OTHER): Payer: 59 | Admitting: Maternal Newborn

## 2018-10-02 VITALS — BP 128/66 | Wt 158.0 lb

## 2018-10-02 DIAGNOSIS — Z3480 Encounter for supervision of other normal pregnancy, unspecified trimester: Secondary | ICD-10-CM

## 2018-10-02 DIAGNOSIS — Z362 Encounter for other antenatal screening follow-up: Secondary | ICD-10-CM

## 2018-10-02 DIAGNOSIS — O99013 Anemia complicating pregnancy, third trimester: Secondary | ICD-10-CM

## 2018-10-02 DIAGNOSIS — Z3689 Encounter for other specified antenatal screening: Secondary | ICD-10-CM

## 2018-10-02 DIAGNOSIS — Z3A35 35 weeks gestation of pregnancy: Secondary | ICD-10-CM

## 2018-10-02 DIAGNOSIS — O99019 Anemia complicating pregnancy, unspecified trimester: Secondary | ICD-10-CM | POA: Insufficient documentation

## 2018-10-02 NOTE — Progress Notes (Signed)
ROB Growth scan Needs refill on Epipen

## 2018-10-02 NOTE — Patient Instructions (Signed)
Third Trimester of Pregnancy The third trimester is from week 28 through week 40 (months 7 through 9). The third trimester is a time when the unborn baby (fetus) is growing rapidly. At the end of the ninth month, the fetus is about 20 inches in length and weighs 6-10 pounds. Body changes during your third trimester Your body will continue to go through many changes during pregnancy. The changes vary from woman to woman. During the third trimester:  Your weight will continue to increase. You can expect to gain 25-35 pounds (11-16 kg) by the end of the pregnancy.  You may begin to get stretch marks on your hips, abdomen, and breasts.  You may urinate more often because the fetus is moving lower into your pelvis and pressing on your bladder.  You may develop or continue to have heartburn. This is caused by increased hormones that slow down muscles in the digestive tract.  You may develop or continue to have constipation because increased hormones slow digestion and cause the muscles that push waste through your intestines to relax.  You may develop hemorrhoids. These are swollen veins (varicose veins) in the rectum that can itch or be painful.  You may develop swollen, bulging veins (varicose veins) in your legs.  You may have increased body aches in the pelvis, back, or thighs. This is due to weight gain and increased hormones that are relaxing your joints.  You may have changes in your hair. These can include thickening of your hair, rapid growth, and changes in texture. Some women also have hair loss during or after pregnancy, or hair that feels dry or thin. Your hair will most likely return to normal after your baby is born.  Your breasts will continue to grow and they will continue to become tender. A yellow fluid (colostrum) may leak from your breasts. This is the first milk you are producing for your baby.  Your belly button may stick out.  You may notice more swelling in your hands,  face, or ankles.  You may have increased tingling or numbness in your hands, arms, and legs. The skin on your belly may also feel numb.  You may feel short of breath because of your expanding uterus.  You may have more problems sleeping. This can be caused by the size of your belly, increased need to urinate, and an increase in your body's metabolism.  You may notice the fetus "dropping," or moving lower in your abdomen (lightening).  You may have increased vaginal discharge.  You may notice your joints feel loose and you may have pain around your pelvic bone. What to expect at prenatal visits You will have prenatal exams every 2 weeks until week 36. Then you will have weekly prenatal exams. During a routine prenatal visit:  You will be weighed to make sure you and the baby are growing normally.  Your blood pressure will be taken.  Your abdomen will be measured to track your baby's growth.  The fetal heartbeat will be listened to.  Any test results from the previous visit will be discussed.  You may have a cervical check near your due date to see if your cervix has softened or thinned (effaced).  You will be tested for Group B streptococcus. This happens between 35 and 37 weeks. Your health care provider may ask you:  What your birth plan is.  How you are feeling.  If you are feeling the baby move.  If you have had any abnormal   symptoms, such as leaking fluid, bleeding, severe headaches, or abdominal cramping.  If you are using any tobacco products, including cigarettes, chewing tobacco, and electronic cigarettes.  If you have any questions. Other tests or screenings that may be performed during your third trimester include:  Blood tests that check for low iron levels (anemia).  Fetal testing to check the health, activity level, and growth of the fetus. Testing is done if you have certain medical conditions or if there are problems during the pregnancy.  Nonstress test  (NST). This test checks the health of your baby to make sure there are no signs of problems, such as the baby not getting enough oxygen. During this test, a belt is placed around your belly. The baby is made to move, and its heart rate is monitored during movement. What is false labor? False labor is a condition in which you feel small, irregular tightenings of the muscles in the womb (contractions) that usually go away with rest, changing position, or drinking water. These are called Braxton Hicks contractions. Contractions may last for hours, days, or even weeks before true labor sets in. If contractions come at regular intervals, become more frequent, increase in intensity, or become painful, you should see your health care provider. What are the signs of labor?  Abdominal cramps.  Regular contractions that start at 10 minutes apart and become stronger and more frequent with time.  Contractions that start on the top of the uterus and spread down to the lower abdomen and back.  Increased pelvic pressure and dull back pain.  A watery or bloody mucus discharge that comes from the vagina.  Leaking of amniotic fluid. This is also known as your "water breaking." It could be a slow trickle or a gush. Let your health care provider know if it has a color or strange odor. If you have any of these signs, call your health care provider right away, even if it is before your due date. Follow these instructions at home: Medicines  Follow your health care provider's instructions regarding medicine use. Specific medicines may be either safe or unsafe to take during pregnancy.  Take a prenatal vitamin that contains at least 600 micrograms (mcg) of folic acid.  If you develop constipation, try taking a stool softener if your health care provider approves. Eating and drinking   Eat a balanced diet that includes fresh fruits and vegetables, whole grains, good sources of protein such as meat, eggs, or tofu,  and low-fat dairy. Your health care provider will help you determine the amount of weight gain that is right for you.  Avoid raw meat and uncooked cheese. These carry germs that can cause birth defects in the baby.  If you have low calcium intake from food, talk to your health care provider about whether you should take a daily calcium supplement.  Eat four or five small meals rather than three large meals a day.  Limit foods that are high in fat and processed sugars, such as fried and sweet foods.  To prevent constipation: ? Drink enough fluid to keep your urine clear or pale yellow. ? Eat foods that are high in fiber, such as fresh fruits and vegetables, whole grains, and beans. Activity  Exercise only as directed by your health care provider. Most women can continue their usual exercise routine during pregnancy. Try to exercise for 30 minutes at least 5 days a week. Stop exercising if you experience uterine contractions.  Avoid heavy lifting.  Do   not exercise in extreme heat or humidity, or at high altitudes.  Wear low-heel, comfortable shoes.  Practice good posture.  You may continue to have sex unless your health care provider tells you otherwise. Relieving pain and discomfort  Take frequent breaks and rest with your legs elevated if you have leg cramps or low back pain.  Take warm sitz baths to soothe any pain or discomfort caused by hemorrhoids. Use hemorrhoid cream if your health care provider approves.  Wear a good support bra to prevent discomfort from breast tenderness.  If you develop varicose veins: ? Wear support pantyhose or compression stockings as told by your healthcare provider. ? Elevate your feet for 15 minutes, 3-4 times a day. Prenatal care  Write down your questions. Take them to your prenatal visits.  Keep all your prenatal visits as told by your health care provider. This is important. Safety  Wear your seat belt at all times when driving.  Make  a list of emergency phone numbers, including numbers for family, friends, the hospital, and police and fire departments. General instructions  Avoid cat litter boxes and soil used by cats. These carry germs that can cause birth defects in the baby. If you have a cat, ask someone to clean the litter box for you.  Do not travel far distances unless it is absolutely necessary and only with the approval of your health care provider.  Do not use hot tubs, steam rooms, or saunas.  Do not drink alcohol.  Do not use any products that contain nicotine or tobacco, such as cigarettes and e-cigarettes. If you need help quitting, ask your health care provider.  Do not use any medicinal herbs or unprescribed drugs. These chemicals affect the formation and growth of the baby.  Do not douche or use tampons or scented sanitary pads.  Do not cross your legs for long periods of time.  To prepare for the arrival of your baby: ? Take prenatal classes to understand, practice, and ask questions about labor and delivery. ? Make a trial run to the hospital. ? Visit the hospital and tour the maternity area. ? Arrange for maternity or paternity leave through employers. ? Arrange for family and friends to take care of pets while you are in the hospital. ? Purchase a rear-facing car seat and make sure you know how to install it in your car. ? Pack your hospital bag. ? Prepare the baby's nursery. Make sure to remove all pillows and stuffed animals from the baby's crib to prevent suffocation.  Visit your dentist if you have not gone during your pregnancy. Use a soft toothbrush to brush your teeth and be gentle when you floss. Contact a health care provider if:  You are unsure if you are in labor or if your water has broken.  You become dizzy.  You have mild pelvic cramps, pelvic pressure, or nagging pain in your abdominal area.  You have lower back pain.  You have persistent nausea, vomiting, or  diarrhea.  You have an unusual or bad smelling vaginal discharge.  You have pain when you urinate. Get help right away if:  Your water breaks before 37 weeks.  You have regular contractions less than 5 minutes apart before 37 weeks.  You have a fever.  You are leaking fluid from your vagina.  You have spotting or bleeding from your vagina.  You have severe abdominal pain or cramping.  You have rapid weight loss or weight gain.  You have   shortness of breath with chest pain.  You notice sudden or extreme swelling of your face, hands, ankles, feet, or legs.  Your baby makes fewer than 10 movements in 2 hours.  You have severe headaches that do not go away when you take medicine.  You have vision changes. Summary  The third trimester is from week 28 through week 40, months 7 through 9. The third trimester is a time when the unborn baby (fetus) is growing rapidly.  During the third trimester, your discomfort may increase as you and your baby continue to gain weight. You may have abdominal, leg, and back pain, sleeping problems, and an increased need to urinate.  During the third trimester your breasts will keep growing and they will continue to become tender. A yellow fluid (colostrum) may leak from your breasts. This is the first milk you are producing for your baby.  False labor is a condition in which you feel small, irregular tightenings of the muscles in the womb (contractions) that eventually go away. These are called Braxton Hicks contractions. Contractions may last for hours, days, or even weeks before true labor sets in.  Signs of labor can include: abdominal cramps; regular contractions that start at 10 minutes apart and become stronger and more frequent with time; watery or bloody mucus discharge that comes from the vagina; increased pelvic pressure and dull back pain; and leaking of amniotic fluid. This information is not intended to replace advice given to you by your  health care provider. Make sure you discuss any questions you have with your health care provider. Document Released: 08/06/2001 Document Revised: 09/17/2016 Document Reviewed: 09/17/2016 [employer] Interactive Patient Education  2019 [employer]  

## 2018-10-02 NOTE — Progress Notes (Signed)
Routine Prenatal Care Visit  Subjective  [name] is a 23 y.o. G2P1001 at [redacted]w[redacted]d being seen today for ongoing prenatal care.  She is currently monitored for the following issues for this low-risk pregnancy and has Supervision of other normal pregnancy, antepartum; Circumvallate placenta, unspecified trimester; and Anemia affecting pregnancy, antepartum on their problem list.  ----------------------------------------------------------------------------------- Patient reports fatigue.   Contractions: Not present. Vag. Bleeding: None.  Movement: Present. No leaking of fluid.  ----------------------------------------------------------------------------------- The following portions of the patient's history were reviewed and updated as appropriate: allergies, current medications, past family history, past medical history, past social history, past surgical history and problem list. Problem list updated.  Objective  Blood pressure 128/66, weight 158 lb (71.7 kg), last menstrual period 01/30/2018, not currently breastfeeding. Pregravid weight 138 lb (62.6 kg) Total Weight Gain 20 lb (9.072 kg) Body mass index is 22.67 kg/m.   Fetal Status: Fetal Heart Rate (bpm): 157   Movement: Present     General:  Alert, oriented and cooperative. Patient is in no acute distress.  Skin: Skin is warm and dry. No rash noted.   Cardiovascular: Normal heart rate noted  Respiratory: Normal respiratory effort, no problems with respiration noted  Abdomen: Soft, gravid, appropriate for gestational age. Pain/Pressure: Present     Pelvic:  Cervical exam deferred        Extremities: Normal range of motion.  Edema: None  Mental Status: Normal mood and affect. Normal behavior. Normal judgment and thought content.    Assessment   23 y.o. G2P1001 at [redacted]w[redacted]d, EDD 11/06/2018 by Last Menstrual Period presenting for a routine prenatal visit.  Plan   July 2019 Pregnancy Problems (from 03/09/18 to present)    Problem Noted Resolved   Supervision of other normal pregnancy, antepartum 03/25/2018 by Oswaldo Conroy, CNM No   Overview Addendum 08/31/2018  1:18 PM by Vena Austria, MD    Clinic [facility] Prenatal Labs  Dating LMP = 6 week [place] Blood type: AB/Positive/-- (08/23 1501)   Genetic Screen NIPS: Normal XY Antibody:Negative (08/23 1501)  Anatomic [place] Normal, low lying placenta (resolved), small BPD on follow up growth Rubella: 18.60 (08/23 1501) Varicella: Immune  GTT 96 RPR: Non Reactive (12/13 1618)   Rhogam N/A HBsAg: Negative (08/23 1501)   TDaP vaccine                       Flu Shot: HIV: Non Reactive (12/13 1618)   Baby Food                                GBS:   Contraception  Pap: 03/20/2018, NILM  CBB     CS/VBAC N/A   Support Person Mom [name]             Low-lying placenta 06/12/2018 by Conard Novak, MD 09/04/2018 by Oswaldo Conroy, CNM   Overview Signed 08/31/2018  1:15 PM by Vena Austria, MD    [X]  Resolved 28 week follow up      Anxiety and depression 04/17/2018 by Conard Novak, MD 07/10/2018 by Tresea Mall, CNM    Growth scan today with growth at the 37.8th percentile, EFW 5 lb, 7 oz., AFI 9.9 cm, cephalic presentation. Images not available on Epic, viewed with technician.  H&H today to monitor anemia.  Please refer to After Visit Summary for other counseling recommendations.   Return in about 1 week (  around 10/09/2018) for ROB.  Marcelyn BruinsJacelyn Schmid, CNM 10/02/2018

## 2018-10-03 LAB — HEMOGLOBIN AND HEMATOCRIT, BLOOD
Hematocrit: 30.6 % — ABNORMAL LOW (ref 34.0–46.6)
Hemoglobin: 10.7 g/dL — ABNORMAL LOW (ref 11.1–15.9)

## 2018-10-08 ENCOUNTER — Encounter: Payer: 59 | Admitting: Obstetrics and Gynecology

## 2018-10-16 ENCOUNTER — Ambulatory Visit (INDEPENDENT_AMBULATORY_CARE_PROVIDER_SITE_OTHER): Payer: 59 | Admitting: Advanced Practice Midwife

## 2018-10-16 ENCOUNTER — Encounter: Payer: Self-pay | Admitting: Advanced Practice Midwife

## 2018-10-16 ENCOUNTER — Other Ambulatory Visit (HOSPITAL_COMMUNITY)
Admission: RE | Admit: 2018-10-16 | Discharge: 2018-10-16 | Disposition: A | Discharge status: Home / Self Care | Payer: 59 | Source: Ambulatory Visit | Attending: Obstetrics and Gynecology | Admitting: Obstetrics and Gynecology

## 2018-10-16 VITALS — BP 122/70 | Wt 160.0 lb

## 2018-10-16 DIAGNOSIS — Z3A37 37 weeks gestation of pregnancy: Secondary | ICD-10-CM | POA: Insufficient documentation

## 2018-10-16 DIAGNOSIS — Z113 Encounter for screening for infections with a predominantly sexual mode of transmission: Secondary | ICD-10-CM

## 2018-10-16 DIAGNOSIS — Z3685 Encounter for antenatal screening for Streptococcus B: Secondary | ICD-10-CM

## 2018-10-16 NOTE — Patient Instructions (Signed)

## 2018-10-16 NOTE — Progress Notes (Signed)
No vb. No lof.  

## 2018-10-16 NOTE — Progress Notes (Signed)
Routine Prenatal Care Visit  Subjective  [name] is a 23 y.o. G2P1001 at [redacted]w[redacted]d being seen today for ongoing prenatal care.  She is currently monitored for the following issues for this low-risk pregnancy and has Supervision of other normal pregnancy, antepartum; Circumvallate placenta, unspecified trimester; and Anemia affecting pregnancy, antepartum on their problem list.  ----------------------------------------------------------------------------------- Patient reports no complaints.   Contractions: Not present. Vag. Bleeding: None.  Movement: Present. Denies leaking of fluid.  ----------------------------------------------------------------------------------- The following portions of the patient's history were reviewed and updated as appropriate: allergies, current medications, past family history, past medical history, past social history, past surgical history and problem list. Problem list updated.   Objective  Blood pressure 122/70, weight 160 lb (72.6 kg), last menstrual period 01/30/2018, not currently breastfeeding. Pregravid weight 138 lb (62.6 kg) Total Weight Gain 22 lb (9.979 kg) Urinalysis: Urine Protein    Urine Glucose    Fetal Status: Fetal Heart Rate (bpm): 153 Fundal Height: 36 cm Movement: Present  Presentation: Vertex  General:  Alert, oriented and cooperative. Patient is in no acute distress.  Skin: Skin is warm and dry. No rash noted.   Cardiovascular: Normal heart rate noted  Respiratory: Normal respiratory effort, no problems with respiration noted  Abdomen: Soft, gravid, appropriate for gestational age. Pain/Pressure: Absent     Pelvic:  Cervical exam performed Dilation: 1 Effacement (%): 60, 70 Station: -2  Extremities: Normal range of motion.     Mental Status: Normal mood and affect. Normal behavior. Normal judgment and thought content.   Assessment   23 y.o. G2P1001 at [redacted]w[redacted]d by  11/06/2018, by Last Menstrual Period presenting for routine  prenatal visit  Plan   July 2019 Pregnancy Problems (from 03/09/18 to present)    Problem Noted Resolved   Supervision of other normal pregnancy, antepartum 03/25/2018 by Oswaldo Conroy, CNM No   Overview Addendum 10/02/2018  4:51 PM by Oswaldo Conroy, CNM    Clinic [facility] Prenatal Labs  Dating LMP = 6 week [place] Blood type: AB/Positive/-- (08/23 1501)   Genetic Screen NIPS: Normal XY Antibody:Negative (08/23 1501)  Anatomic [place] Normal, low lying placenta (resolved) Rubella: 18.60 (08/23 1501) Varicella: Immune  GTT 96 RPR: Non Reactive (12/13 1618)   Rhogam N/A HBsAg: Negative (08/23 1501)   TDaP vaccine                       Flu Shot: HIV: Non Reactive (12/13 1618)   Baby Food                                GBS:   Contraception  Pap: 03/20/2018, NILM  CBB     CS/VBAC N/A   Support Person Mom [name]             Low-lying placenta 06/12/2018 by Conard Novak, MD 09/04/2018 by Oswaldo Conroy, CNM   Overview Signed 08/31/2018  1:15 PM by Vena Austria, MD    [X]  Resolved 28 week follow up      Anxiety and depression 04/17/2018 by Conard Novak, MD 07/10/2018 by Tresea Mall, CNM       Term labor symptoms and general obstetric precautions including but not limited to vaginal bleeding, contractions, leaking of fluid and fetal movement were reviewed in detail with the patient. Please refer to After Visit Summary for other counseling recommendations.   Return in about 1 week (around  10/23/2018) for rob.  Tresea Mall, CNM 10/16/2018 4:01 PM

## 2018-10-18 LAB — STREP GP B NAA: Strep Gp B NAA: NEGATIVE

## 2018-10-20 LAB — CERVICOVAGINAL ANCILLARY ONLY
CHLAMYDIA, DNA PROBE: NEGATIVE
Neisseria Gonorrhea: NEGATIVE
Trichomonas: NEGATIVE

## 2018-10-23 ENCOUNTER — Ambulatory Visit (INDEPENDENT_AMBULATORY_CARE_PROVIDER_SITE_OTHER): Payer: 59 | Admitting: Certified Nurse Midwife

## 2018-10-23 ENCOUNTER — Encounter: Payer: Self-pay | Admitting: Certified Nurse Midwife

## 2018-10-23 VITALS — BP 116/64 | Wt 167.0 lb

## 2018-10-23 DIAGNOSIS — Z3483 Encounter for supervision of other normal pregnancy, third trimester: Secondary | ICD-10-CM

## 2018-10-23 DIAGNOSIS — Z3A38 38 weeks gestation of pregnancy: Secondary | ICD-10-CM

## 2018-10-23 DIAGNOSIS — Z3480 Encounter for supervision of other normal pregnancy, unspecified trimester: Secondary | ICD-10-CM

## 2018-10-23 LAB — POCT URINALYSIS DIPSTICK OB
Glucose, UA: NEGATIVE
POC,PROTEIN,UA: NEGATIVE

## 2018-10-23 NOTE — Progress Notes (Signed)
Desires sweeping of membranes today.

## 2018-10-23 NOTE — Progress Notes (Signed)
ROB at 38 weeks: Doing well. Some irregular contractions. No vaginal bleeding or leakage of fluid. Baby active Has her baby shower this weekend. Wants to breast feed Cervix: unchanged from last week. Labor precautions ROB 1 week.   Farrel Conners, CNM

## 2018-10-27 ENCOUNTER — Telehealth: Payer: Self-pay

## 2018-10-27 NOTE — Telephone Encounter (Signed)
Pt called office for adv.  CTX since 2am; getting more intense, kept her up; water hasn't broke; migraine.  Pt states she was 1cm 60% at last ck.  Currently ctxs are apart.  Adv pt to count ctxs and when 5-70min apart to go to L&D via ED.  If water breaks, vaginal bleeding, pain so bad she cannot talk through it she needs to go to L&D via ED.  Pt has a 78m old.  Adv pt to try to find childcare while she's waiting on ctxs to get closer together.  Joy notified and adv 52m could not come to hosp c pt.  Called pt back and adv to take two e.s. tylenol and drink some caffeine for migraine and toddler was not allowed in hosp.  Pt is trying to find someone to care for child.

## 2018-10-30 ENCOUNTER — Telehealth: Payer: Self-pay

## 2018-10-30 ENCOUNTER — Ambulatory Visit (INDEPENDENT_AMBULATORY_CARE_PROVIDER_SITE_OTHER): Payer: 59 | Admitting: Obstetrics and Gynecology

## 2018-10-30 ENCOUNTER — Encounter: Payer: Self-pay | Admitting: Obstetrics and Gynecology

## 2018-10-30 VITALS — BP 118/62 | Wt 165.0 lb

## 2018-10-30 DIAGNOSIS — O99013 Anemia complicating pregnancy, third trimester: Secondary | ICD-10-CM

## 2018-10-30 DIAGNOSIS — Z3480 Encounter for supervision of other normal pregnancy, unspecified trimester: Secondary | ICD-10-CM

## 2018-10-30 DIAGNOSIS — Z3A39 39 weeks gestation of pregnancy: Secondary | ICD-10-CM

## 2018-10-30 DIAGNOSIS — O43119 Circumvallate placenta, unspecified trimester: Secondary | ICD-10-CM

## 2018-10-30 DIAGNOSIS — O99019 Anemia complicating pregnancy, unspecified trimester: Secondary | ICD-10-CM

## 2018-10-30 DIAGNOSIS — O43113 Circumvallate placenta, third trimester: Secondary | ICD-10-CM

## 2018-10-30 NOTE — Telephone Encounter (Signed)
Pt has received a bill.  She received the flu shot on September 24, 2018.  It was coded under [facility] not [facility] and she saw [facility].  Has appt today and doesn't want the same issues.  [phone]

## 2018-10-30 NOTE — Progress Notes (Signed)
ROB C/o pain and pressure  Desires membrane sweep

## 2018-10-30 NOTE — Progress Notes (Signed)
Routine Prenatal Care Visit  Subjective  [name] is a 23 y.o. G2P1001 at [redacted]w[redacted]d being seen today for ongoing prenatal care.  She is currently monitored for the following issues for this low-risk pregnancy and has Supervision of other normal pregnancy, antepartum; Circumvallate placenta, unspecified trimester; and Anemia affecting pregnancy, antepartum on their problem list.  ----------------------------------------------------------------------------------- Patient reports no complaints.   Contractions: Irregular. Vag. Bleeding: None.  Movement: Present. Denies leaking of fluid.  ----------------------------------------------------------------------------------- The following portions of the patient's history were reviewed and updated as appropriate: allergies, current medications, past family history, past medical history, past social history, past surgical history and problem list. Problem list updated.   Objective  Blood pressure 118/62, weight 165 lb (74.8 kg), last menstrual period 01/30/2018, not currently breastfeeding. Pregravid weight 138 lb (62.6 kg) Total Weight Gain 27 lb (12.2 kg) Urinalysis:      Fetal Status: Fetal Heart Rate (bpm): 140 Fundal Height: 37 cm Movement: Present  Presentation: Vertex  General:  Alert, oriented and cooperative. Patient is in no acute distress.  Skin: Skin is warm and dry. No rash noted.   Cardiovascular: Normal heart rate noted  Respiratory: Normal respiratory effort, no problems with respiration noted  Abdomen: Soft, gravid, appropriate for gestational age. Pain/Pressure: Absent     Pelvic:  Cervical exam performed Dilation: 2 Effacement (%): 60 Station: -3 Membranes swept at maternal request  Extremities: Normal range of motion.  Edema: None  Mental Status: Normal mood and affect. Normal behavior. Normal judgment and thought content.     Assessment   23 y.o. G2P1001 at [redacted]w[redacted]d by  11/06/2018, by Last Menstrual Period presenting  for routine prenatal visit  Plan   July 2019 Pregnancy Problems (from 03/09/18 to present)    Problem Noted Resolved   Supervision of other normal pregnancy, antepartum 03/25/2018 by Oswaldo Conroy, CNM No   Overview Addendum 10/23/2018  2:31 PM by Farrel Conners, CNM    Clinic [facility] Prenatal Labs  Dating LMP = 6 week [place] Blood type: AB/Positive/-- (08/23 1501)   Genetic Screen NIPS: Normal XY Antibody:Negative (08/23 1501)  Anatomic [place] Normal, low lying placenta (resolved) Rubella: 18.60 (08/23 1501) Varicella: Immune  GTT 96 RPR: Non Reactive (12/13 1618)   Rhogam N/A HBsAg: Negative (08/23 1501)   TDaP vaccine      2018                 Flu Shot: HIV: Non Reactive (12/13 1618)   Baby Food     Breast                           GBS: negative  Contraception  Pap: 03/20/2018, NILM  CBB     CS/VBAC N/A   Support Person Mom [name]             Low-lying placenta 06/12/2018 by Conard Novak, MD 09/04/2018 by Oswaldo Conroy, CNM   Overview Signed 08/31/2018  1:15 PM by Vena Austria, MD    [X]  Resolved 28 week follow up      Anxiety and depression 04/17/2018 by Conard Novak, MD 07/10/2018 by Tresea Mall, CNM       Gestational age appropriate obstetric precautions including but not limited to vaginal bleeding, contractions, leaking of fluid and fetal movement were reviewed in detail with the patient.    Monica notified of patient's billing issue with medicaid Membranes swept at maternal request Given ready set  baby booklet  Return in about 1 week (around 11/06/2018) for ROB.  Natale Milch MD [facility], [facility] 10/30/2018, 3:42 PM

## 2018-10-31 ENCOUNTER — Observation Stay
Admission: EM | Admit: 2018-10-31 | Discharge: 2018-10-31 | Disposition: A | Discharge status: Home / Self Care | Payer: 59 | Attending: Obstetrics and Gynecology | Admitting: Obstetrics and Gynecology

## 2018-10-31 ENCOUNTER — Other Ambulatory Visit: Payer: Self-pay

## 2018-10-31 DIAGNOSIS — O26893 Other specified pregnancy related conditions, third trimester: Principal | ICD-10-CM | POA: Insufficient documentation

## 2018-10-31 DIAGNOSIS — Z3A39 39 weeks gestation of pregnancy: Secondary | ICD-10-CM | POA: Diagnosis not present

## 2018-10-31 DIAGNOSIS — Z3480 Encounter for supervision of other normal pregnancy, unspecified trimester: Secondary | ICD-10-CM

## 2018-10-31 NOTE — Discharge Summary (Signed)
Patient presented for evaluation of labor.  Patient had cervical exam by RN and this was reported to me. I reviewed her vital signs and fetal tracing, both of which were reassuring.  Patient was discharge as she was not laboring.   Thomasene Mohair, MD, [facility], [facility] 10/31/2018 5:08 PM

## 2018-10-31 NOTE — OB Triage Note (Signed)
Patient here for contractions that began around 530 this morning, getting stronger in intensity throughout the day. She states she started to time them around 8 am and they have been every 5-7 minutes since then. She denies any other complaints no LOF or bleeding. Was seen at [facility] yesterday and was 2cm and had her membranes swept.

## 2018-11-02 ENCOUNTER — Observation Stay
Admission: EM | Admit: 2018-11-02 | Discharge: 2018-11-02 | Disposition: A | Discharge status: Home / Self Care | Payer: 59 | Attending: Obstetrics and Gynecology | Admitting: Obstetrics and Gynecology

## 2018-11-02 ENCOUNTER — Other Ambulatory Visit: Payer: Self-pay

## 2018-11-02 DIAGNOSIS — D573 Sickle-cell trait: Secondary | ICD-10-CM | POA: Insufficient documentation

## 2018-11-02 DIAGNOSIS — Z3A39 39 weeks gestation of pregnancy: Secondary | ICD-10-CM | POA: Insufficient documentation

## 2018-11-02 DIAGNOSIS — Z3480 Encounter for supervision of other normal pregnancy, unspecified trimester: Secondary | ICD-10-CM

## 2018-11-02 DIAGNOSIS — O471 False labor at or after 37 completed weeks of gestation: Principal | ICD-10-CM | POA: Insufficient documentation

## 2018-11-02 DIAGNOSIS — O99013 Anemia complicating pregnancy, third trimester: Secondary | ICD-10-CM | POA: Insufficient documentation

## 2018-11-02 LAB — URINALYSIS, COMPLETE (UACMP) WITH MICROSCOPIC
Bacteria, UA: NONE SEEN
Bilirubin Urine: NEGATIVE
Glucose, UA: NEGATIVE mg/dL
Hgb urine dipstick: NEGATIVE
Ketones, ur: NEGATIVE mg/dL
Nitrite: NEGATIVE
Protein, ur: NEGATIVE mg/dL
Specific Gravity, Urine: 1.005 (ref 1.005–1.030)
pH: 7 (ref 5.0–8.0)

## 2018-11-02 MED ORDER — ACETAMINOPHEN 325 MG PO TABS: 650.0000 mg | ORAL_TABLET | ORAL | Status: DC | PRN

## 2018-11-02 NOTE — Discharge Summary (Signed)
Physician Final Progress Note  Patient ID: [name] MRN: [id number] DOB/AGE: Jul 07, 1996 23 y.o.  Admit date: 11/02/2018 Admitting provider: Vena Austria, MD Discharge date: 11/02/2018   Admission Diagnoses: Contractions  Discharge Diagnoses: Contractions  History of Present Illness: The patient is a 23 y.o. female G2P1001 at [redacted]w[redacted]d who presents for painful contractions that began about 0700. She rates the intensity as 5/10. They are coming at irregular intervals. She does not have vaginal bleeding or loss of fluid. She denies lower back pain and dysuria. Her baby is moving well.  Review of Systems: Review of systems negative unless otherwise noted in HPI.   Past Medical History:  Diagnosis Date  . Depression   . Heart murmur   . Sickle cell trait [facility])     Past Surgical History:  Procedure Laterality Date  . NO PAST SURGERIES      No current facility-administered medications on file prior to encounter.    Current Outpatient Medications on File Prior to Encounter  Medication Sig Dispense Refill  . ferrous sulfate 325 (65 FE) MG tablet Take 325 mg by mouth daily with breakfast.    . Misc. Devices (BREAST PUMP) MISC Dispense one breast pump for patient (Patient not taking: Reported on 10/23/2018) 1 each 0  . Prenatal Vit-Fe Fumarate-FA (MULTIVITAMIN-PRENATAL) 27-0.8 MG TABS tablet Take 1 tablet by mouth daily at 12 noon.      Allergies  Allergen Reactions  . Peanuts [Peanut Oil] Anaphylaxis, Hives and Swelling    Tree nuts also   . Mushroom Extract Complex     Social History   Socioeconomic History  . Marital status: Single    Spouse name: Not on file  . Number of children: 1  . Years of education: 15.5  . Highest education level: Not on file  Occupational History  . Occupation: FULL TIME STUDENT    Comment: [employer]  Social Needs  . Financial resource strain: Not on file  . Food insecurity:    Worry: Not on file    Inability: Not on file  .  Transportation needs:    Medical: Not on file    Non-medical: Not on file  Tobacco Use  . Smoking status: Never Smoker  . Smokeless tobacco: Never Used  Substance and Sexual Activity  . Alcohol use: Not Currently  . Drug use: No  . Sexual activity: Not Currently    Birth control/protection: I.U.D.  Lifestyle  . Physical activity:    Days per week: Not on file    Minutes per session: Not on file  . Stress: Not on file  Relationships  . Social connections:    Talks on phone: Not on file    Gets together: Not on file    Attends religious service: Not on file    Active member of club or organization: Not on file    Attends meetings of clubs or organizations: Not on file    Relationship status: Not on file  . Intimate partner violence:    Fear of current or ex partner: Not on file    Emotionally abused: Not on file    Physically abused: Not on file    Forced sexual activity: Not on file  Other Topics Concern  . Not on file  Social History Narrative  . Not on file    Family history: Negative/unremarkable except as detailed in HPI. No family history of birth defects.   Physical Exam: BP (!) 124/53   Pulse 80   Temp 98.3  F (36.8 C) (Oral)   Resp 19   Ht  (1.778 m)   Wt 75.8 kg   LMP 01/30/2018 (Approximate)   BMI 23.96 kg/m   Gen: NAD CV: Regular rate Pulm: No increased work of breathing Pelvic: 3/60/-2 and posterior, 2 exams by same RN one hour apart Ext: No signs of DVT  NST Baseline: 140 Variability: moderate Accelerations: present Decelerations: absent Tocometry: occasional contractions, some uterine irritability The patient was monitored for >30 minutes, fetal heart rate tracing was deemed reactive.  Significant Findings/ Diagnostic Studies: labs: UA with small leukocytes, no bacteria  Procedures: NST  Discharge Condition: stable  Disposition: Discharge disposition: 01-Home or Self Care       Diet: Regular diet  Discharge Activity:  Activity as tolerated  Discharge Instructions    Discharge activity:  No Restrictions   Complete by:  As directed    Discharge diet:  No restrictions   Complete by:  As directed    Fetal Kick Count:  Lie on our left side for one hour after a meal, and count the number of times your baby kicks.  If it is less than 5 times, get up, move around and drink some juice.  Repeat the test 30 minutes later.  If it is still less than 5 kicks in an hour, notify your doctor.   Complete by:  As directed    LABOR:  When conractions begin, you should start to time them from the beginning of one contraction to the beginning  of the next.  When contractions are 5 - 10 minutes apart or less and have been regular for at least an hour, you should call your health care provider.   Complete by:  As directed    No sexual activity restrictions   Complete by:  As directed    Notify physician for bleeding from the vagina   Complete by:  As directed    Notify physician for blurring of vision or spots before the eyes   Complete by:  As directed    Notify physician for chills or fever   Complete by:  As directed    Notify physician for fainting spells, "black outs" or loss of consciousness   Complete by:  As directed    Notify physician for increase in vaginal discharge   Complete by:  As directed    Notify physician for leaking of fluid   Complete by:  As directed    Notify physician for pain or burning when urinating   Complete by:  As directed    Notify physician for pelvic pressure (sudden increase)   Complete by:  As directed    Notify physician for severe or continued nausea or vomiting   Complete by:  As directed    Notify physician for sudden gushing of fluid from the vagina (with or without continued leaking)   Complete by:  As directed    Notify physician for sudden, constant, or occasional abdominal pain   Complete by:  As directed    Notify physician if baby moving less than usual   Complete by:  As  directed      Allergies as of 11/02/2018      Reactions   Peanuts [peanut Oil] Anaphylaxis, Hives, Swelling   Tree nuts also    Mushroom Extract Complex       Medication List    TAKE these medications   Breast Pump Misc Dispense one breast pump for patient   ferrous  sulfate 325 (65 FE) MG tablet Take 325 mg by mouth daily with breakfast.   multivitamin-prenatal 27-0.8 MG Tabs tablet Take 1 tablet by mouth daily at 12 noon.      She is interested in a labor induction at term but would prefer to wait until after her due date to schedule an induction. IOL planned for 11/07/2018 at 0500. Return precautions emphasized.  Signed: Oswaldo Conroy, CNM  11/02/2018

## 2018-11-02 NOTE — OB Triage Note (Signed)
Patient presented to L&D with complaints of contractions that woke her around 7:00 this morning, denies leaking of fluid, vaginal bleeding or decreased fetal movement.

## 2018-11-03 ENCOUNTER — Other Ambulatory Visit: Payer: Self-pay | Admitting: Maternal Newborn

## 2018-11-03 NOTE — Progress Notes (Signed)
Obstetrics Admission History & Physical   Encounter for planned induction of labor  HPI:  23 y.o. G2P1001 @ [redacted]w[redacted]d (11/06/2018, by Last Menstrual Period). Admitted on (Not on file):   Patient Active Problem List   Diagnosis Date Noted  . Anemia affecting pregnancy, antepartum 10/02/2018  . Circumvallate placenta, unspecified trimester 09/04/2018  . Supervision of other normal pregnancy, antepartum 03/25/2018     Presents for a planned induction of labor at term. No vaginal bleeding or loss of fluid. Having occasional painful contractions. Baby is moving well.  Prenatal care at: at [facility] Pregnancy complicated by anemia and circumvallate placenta.  ROS: A review of systems was performed and negative, except as stated in the above HPI.  PMHx:  Past Medical History:  Diagnosis Date  . Depression   . Heart murmur   . Sickle cell trait (HCC)    PSHx:  Past Surgical History:  Procedure Laterality Date  . NO PAST SURGERIES     Medications:  Current Outpatient Medications on File Prior to Visit  Medication Sig Dispense Refill  . ferrous sulfate 325 (65 FE) MG tablet Take 325 mg by mouth daily with breakfast.    . Misc. Devices (BREAST PUMP) MISC Dispense one breast pump for patient (Patient not taking: Reported on 10/23/2018) 1 each 0  . Prenatal Vit-Fe Fumarate-FA (MULTIVITAMIN-PRENATAL) 27-0.8 MG TABS tablet Take 1 tablet by mouth daily at 12 noon.     No current facility-administered medications on file prior to visit.    Allergies: is allergic to peanuts [peanut oil] and mushroom extract complex. OBHx:  OB History  Gravida Para Term Preterm AB Living  2 1 1     1   SAB TAB Ectopic Multiple Live Births        0 1    # Outcome Date GA Lbr Len/2nd Weight Sex Delivery Anes PTL Lv  2 Current           1 Term 02/02/17 [redacted]w[redacted]d / 00:24 7 lb 7.9 oz (3.4 kg) M Vag-Spont None  LIV   FHx  Family History  Problem Relation Age of Onset  . Hypertension Maternal Grandmother   .  Healthy Mother   . Healthy Father    Soc Hx: Never smoker, Alcohol: none and Recreational drug use: none  Objective:  BP 124/53, Pulse 80, Last menstrual period 01/30/2018, not currently breastfeeding.  Constitutional: Well nourished, well developed female in no acute distress.  HEENT: normal Skin: Warm and dry.  Cardiovascular: Regular rate  Extremity: trace to 1+ bilateral pedal edema Respiratory: Normal respiratory effort Abdomen: gravid, non-tender Neuro: Cranial nerves grossly intact Psych: Alert and Oriented x3. No memory deficits. Normal mood and affect.  MS: normal gait, normal bilateral lower extremity ROM/strength/stability.  Cervix: 3/60/-2 (RN exam) Uterus: Spontaneous uterine activity   EFM:FHR: 135 bpm, variability: moderate,  accelerations:  Present,  decelerations:  Absent Toco: occasional contractions   Perinatal info:  Blood type: AB positive Rubella - Immune Varicella - Immune TDaP last received 11/19/2016 RPR NR / HIV Neg/ HBsAg Neg   Assessment & Plan:   23 y.o. G2P1001 @ [redacted]w[redacted]d, Admitted for planned induction of labor.    Admit for labor, Observe for cervical change, Fetal Wellbeing Reassuring, Epidural when ready, AROM when Appropriate and GBS status negative, treat as needed  Marcelyn Bruins, CNM [facility], [facility] 11/02/2018

## 2018-11-06 ENCOUNTER — Other Ambulatory Visit: Payer: Self-pay

## 2018-11-06 ENCOUNTER — Encounter: Payer: 59 | Admitting: Obstetrics & Gynecology

## 2018-11-06 ENCOUNTER — Inpatient Hospital Stay
Admission: EM | Admit: 2018-11-06 | Discharge: 2018-11-07 | DRG: 807 | Disposition: A | Discharge status: Home / Self Care | Payer: 59 | Attending: Obstetrics and Gynecology | Admitting: Obstetrics and Gynecology

## 2018-11-06 DIAGNOSIS — Z3A40 40 weeks gestation of pregnancy: Secondary | ICD-10-CM

## 2018-11-06 DIAGNOSIS — O26893 Other specified pregnancy related conditions, third trimester: Secondary | ICD-10-CM | POA: Diagnosis present

## 2018-11-06 DIAGNOSIS — Z3490 Encounter for supervision of normal pregnancy, unspecified, unspecified trimester: Secondary | ICD-10-CM | POA: Diagnosis present

## 2018-11-06 DIAGNOSIS — O9902 Anemia complicating childbirth: Secondary | ICD-10-CM | POA: Diagnosis present

## 2018-11-06 DIAGNOSIS — D573 Sickle-cell trait: Secondary | ICD-10-CM | POA: Diagnosis present

## 2018-11-06 DIAGNOSIS — O480 Post-term pregnancy: Secondary | ICD-10-CM

## 2018-11-06 LAB — CBC
HCT: 32.2 % — ABNORMAL LOW (ref 36.0–46.0)
Hemoglobin: 10.8 g/dL — ABNORMAL LOW (ref 12.0–15.0)
MCH: 27.7 pg (ref 26.0–34.0)
MCHC: 33.5 g/dL (ref 30.0–36.0)
MCV: 82.6 fL (ref 80.0–100.0)
Platelets: 133 10*3/uL — ABNORMAL LOW (ref 150–400)
RBC: 3.9 MIL/uL (ref 3.87–5.11)
RDW: 14 % (ref 11.5–15.5)
WBC: 9.5 10*3/uL (ref 4.0–10.5)
nRBC: 0 % (ref 0.0–0.2)

## 2018-11-06 LAB — TYPE AND SCREEN
ABO/RH(D): AB POS
Antibody Screen: NEGATIVE

## 2018-11-06 MED ORDER — MISOPROSTOL 100 MCG PO TABS
25.0000 ug | ORAL_TABLET | ORAL | Status: DC | PRN
  Filled 2018-11-06: qty 1

## 2018-11-06 MED ORDER — ONDANSETRON HCL 4 MG/2ML IJ SOLN: 4.0000 mg | Freq: Four times a day (QID) | INTRAMUSCULAR | Status: DC | PRN

## 2018-11-06 MED ORDER — SIMETHICONE 80 MG PO CHEW: 80.0000 mg | CHEWABLE_TABLET | ORAL | Status: DC | PRN

## 2018-11-06 MED ORDER — WITCH HAZEL-GLYCERIN EX PADS: 1.0000 "application " | MEDICATED_PAD | CUTANEOUS | Status: DC | PRN

## 2018-11-06 MED ORDER — DIBUCAINE 1 % RE OINT: 1.0000 "application " | TOPICAL_OINTMENT | RECTAL | Status: DC | PRN

## 2018-11-06 MED ORDER — DIPHENHYDRAMINE HCL 25 MG PO CAPS: 25.0000 mg | ORAL_CAPSULE | Freq: Four times a day (QID) | ORAL | Status: DC | PRN

## 2018-11-06 MED ORDER — OXYTOCIN 40 UNITS IN NORMAL SALINE INFUSION - SIMPLE MED: 2.5000 [IU]/h | INTRAVENOUS | Status: DC

## 2018-11-06 MED ORDER — DOCUSATE SODIUM 100 MG PO CAPS
100.0000 mg | ORAL_CAPSULE | Freq: Two times a day (BID) | ORAL | Status: DC
  Administered 2018-11-06 – 2018-11-07 (×2): 100 mg via ORAL
  Filled 2018-11-06 (×2): qty 1

## 2018-11-06 MED ORDER — LIDOCAINE HCL (PF) 1 % IJ SOLN
30.0000 mL | INTRAMUSCULAR | Status: AC | PRN
  Administered 2018-11-06: 30 mL via SUBCUTANEOUS

## 2018-11-06 MED ORDER — LACTATED RINGERS IV SOLN: 500.0000 mL | INTRAVENOUS | Status: DC | PRN

## 2018-11-06 MED ORDER — OXYTOCIN 10 UNIT/ML IJ SOLN
INTRAMUSCULAR | Status: AC
  Filled 2018-11-06: qty 2

## 2018-11-06 MED ORDER — LACTATED RINGERS IV SOLN
INTRAVENOUS | Status: DC
  Administered 2018-11-06: 03:00:00 via INTRAVENOUS

## 2018-11-06 MED ORDER — MAGNESIUM HYDROXIDE 400 MG/5ML PO SUSP: 30.0000 mL | ORAL | Status: DC | PRN

## 2018-11-06 MED ORDER — ACETAMINOPHEN 325 MG PO TABS: 650.0000 mg | ORAL_TABLET | ORAL | Status: DC | PRN

## 2018-11-06 MED ORDER — FERROUS SULFATE 325 (65 FE) MG PO TABS
325.0000 mg | ORAL_TABLET | Freq: Two times a day (BID) | ORAL | Status: DC
  Administered 2018-11-06 – 2018-11-07 (×3): 325 mg via ORAL
  Filled 2018-11-06 (×3): qty 1

## 2018-11-06 MED ORDER — LIDOCAINE HCL (PF) 1 % IJ SOLN
INTRAMUSCULAR | Status: AC
  Administered 2018-11-06: 30 mL via SUBCUTANEOUS
  Filled 2018-11-06: qty 30

## 2018-11-06 MED ORDER — COCONUT OIL OIL
1.0000 "application " | TOPICAL_OIL | Status: DC | PRN
  Administered 2018-11-06: 1 via TOPICAL
  Filled 2018-11-06: qty 120

## 2018-11-06 MED ORDER — ONDANSETRON HCL 4 MG PO TABS: 4.0000 mg | ORAL_TABLET | ORAL | Status: DC | PRN

## 2018-11-06 MED ORDER — PRENATAL MULTIVITAMIN CH
1.0000 | ORAL_TABLET | Freq: Every day | ORAL | Status: DC
  Administered 2018-11-06 – 2018-11-07 (×2): 1 via ORAL
  Filled 2018-11-06 (×2): qty 1

## 2018-11-06 MED ORDER — TERBUTALINE SULFATE 1 MG/ML IJ SOLN: 0.2500 mg | Freq: Once | INTRAMUSCULAR | Status: DC | PRN

## 2018-11-06 MED ORDER — AMMONIA AROMATIC IN INHA
RESPIRATORY_TRACT | Status: AC
  Filled 2018-11-06: qty 10

## 2018-11-06 MED ORDER — OXYTOCIN 40 UNITS IN NORMAL SALINE INFUSION - SIMPLE MED
1.0000 m[IU]/min | INTRAVENOUS | Status: DC
  Filled 2018-11-06: qty 1000

## 2018-11-06 MED ORDER — MISOPROSTOL 200 MCG PO TABS
ORAL_TABLET | ORAL | Status: AC
  Filled 2018-11-06: qty 4

## 2018-11-06 MED ORDER — TETANUS-DIPHTH-ACELL PERTUSSIS 5-2.5-18.5 LF-MCG/0.5 IM SUSP: 0.5000 mL | Freq: Once | INTRAMUSCULAR | Status: DC

## 2018-11-06 MED ORDER — ONDANSETRON HCL 4 MG/2ML IJ SOLN: 4.0000 mg | INTRAMUSCULAR | Status: DC | PRN

## 2018-11-06 MED ORDER — BUTORPHANOL TARTRATE 1 MG/ML IJ SOLN
1.0000 mg | INTRAMUSCULAR | Status: DC | PRN
  Administered 2018-11-06: 1 mg via INTRAVENOUS
  Filled 2018-11-06: qty 1

## 2018-11-06 MED ORDER — IBUPROFEN 600 MG PO TABS
600.0000 mg | ORAL_TABLET | Freq: Four times a day (QID) | ORAL | Status: DC
  Administered 2018-11-06 – 2018-11-07 (×6): 600 mg via ORAL
  Filled 2018-11-06 (×6): qty 1

## 2018-11-06 MED ORDER — ZOLPIDEM TARTRATE 5 MG PO TABS: 5.0000 mg | ORAL_TABLET | Freq: Every evening | ORAL | Status: DC | PRN

## 2018-11-06 MED ORDER — OXYTOCIN BOLUS FROM INFUSION
500.0000 mL | Freq: Once | INTRAVENOUS | Status: AC
  Administered 2018-11-06: 500 mL via INTRAVENOUS

## 2018-11-06 MED ORDER — BENZOCAINE-MENTHOL 20-0.5 % EX AERO
1.0000 "application " | INHALATION_SPRAY | CUTANEOUS | Status: DC | PRN
  Filled 2018-11-06: qty 56

## 2018-11-06 NOTE — H&P (Signed)
H&P  [name] is an 23 y.o. female.  HPI: Patient presents today complaining of contractions which have been present  Since 9:45pm. She has made some cervical change since her last examination in office. She was offered admission with augmentation of labor, however she is very hesitant. She would like to be admitted with expectant management. She did not make cervical change after an hour on repeat exam. Her options for being discharged versus admitted for augmentation were discussed in detail.  After a long conversation [name] was okay with being admitted for augmentation of labor by having her water broken. She understood that this could lead to her needing pitocin in the future and that was okay if needed.   OB History    Gravida  2   Para  1   Term  1   Preterm      AB      Living  1     SAB      TAB      Ectopic      Multiple  0   Live Births  1         2018 41 weeks, SVD 74g Female  Past Medical History:  Diagnosis Date  . Depression   . Heart murmur   . Sickle cell trait [facility])     Past Surgical History:  Procedure Laterality Date  . NO PAST SURGERIES      Family History  Problem Relation Age of Onset  . Hypertension Maternal Grandmother   . Healthy Mother   . Healthy Father     Social History:  reports that she has never smoked. She has never used smokeless tobacco. She reports previous alcohol use. She reports that she does not use drugs.  Allergies:  Allergies  Allergen Reactions  . Peanuts [Peanut Oil] Anaphylaxis, Hives and Swelling    Tree nuts also   . Mushroom Extract Complex     Medications: I have reviewed the patient's current medications.  No results found for this or any previous visit (from the past 48 hour(s)).  No results found.  Review of Systems  Constitutional: Negative for chills, fever, malaise/fatigue and weight loss.  HENT: Negative for congestion, hearing loss and sinus pain.   Eyes: Negative for blurred  vision and double vision.  Respiratory: Negative for cough, sputum production, shortness of breath and wheezing.   Cardiovascular: Negative for chest pain, palpitations, orthopnea and leg swelling.  Gastrointestinal: Negative for abdominal pain, constipation, diarrhea, nausea and vomiting.  Genitourinary: Negative for dysuria, flank pain, frequency, hematuria and urgency.  Musculoskeletal: Negative for back pain, falls and joint pain.  Skin: Negative for itching and rash.  Neurological: Negative for dizziness and headaches.  Psychiatric/Behavioral: Negative for depression, substance abuse and suicidal ideas. The patient is not nervous/anxious.    Blood pressure 124/79, temperature 98.5 F (36.9 C), temperature source Oral, resp. rate 19, height 5\' 10"  (1.778 m), weight 75.7 kg, last menstrual period 01/30/2018, not currently breastfeeding.   Physical Exam  Nursing note and vitals reviewed. Constitutional: She is oriented to person, place, and time. She appears well-developed and well-nourished.  HENT:  Head: Normocephalic and atraumatic.  Cardiovascular: Normal rate and regular rhythm.  Respiratory: Effort normal and breath sounds normal.  GI: Soft. Bowel sounds are normal.  Musculoskeletal: Normal range of motion.  Neurological: She is alert and oriented to person, place, and time.  Skin: Skin is warm and dry.  Psychiatric: She has a normal mood and affect.  Her behavior is normal. Judgment and thought content normal.   NST: 140 bpm baseline, moderate variability, 15x15 accelerations, no decelerations. Tocometer : every 4-6 minutes  Assessment/Plan: 23 yo G2P1001 [redacted]w[redacted]d 1. Will admit for augmentation of labor 2. GBS negative 3. Pain management: as desired 4. Will plan AROM when support person has arrived.   [name] 11/06/2018, 2:04 AM

## 2018-11-06 NOTE — OB Triage Note (Signed)
Pt G2P1 [redacted]w[redacted]d presents to ED c.o contractions, pt stated, "The contractions started at 9:45pm and picked up at 11:30, the nurse advice line told me to come in since it is my 2nd baby." Pt denies leaking of fluid, vaginal bleeding, states positive fetal movement. External monitors applied and assessing. VS WNL.

## 2018-11-06 NOTE — Discharge Summary (Signed)
OB Discharge Summary     Patient Name: [name] DOB: Sep 13, 1995 MRN: [id number]  Date of admission: 11/06/2018 Delivering MD: Vena Austria, MD  Date of Delivery: 11/06/2018  Date of discharge: 11/07/2018  Admitting diagnosis: Pregnancy Intrauterine pregnancy: [redacted]w[redacted]d     Secondary diagnosis: Anemia     Discharge diagnosis: Term Pregnancy Delivered                         Hospital course:  Onset of Labor With Vaginal Delivery     23 y.o. yo G2P1001 at 103w0d was admitted in Latent Labor on 11/06/2018. Patient had an uncomplicated labor course as follows:  Membrane Rupture Time/Date: 3:08 AM ,11/06/2018   Intrapartum Procedures: Episiotomy: None [1]                                         Lacerations:  2nd degree [3]  Patient had a delivery of a Viable infant. 11/06/2018  Information for the patient's newborn:  Kira, Gamel [[id number]]  Delivery Method: Vag-Spont    Pateint had an uncomplicated postpartum course.  She is ambulating, tolerating a regular diet, passing flatus, and urinating well. Patient is discharged home in stable condition on 11/07/18.                                                                  Post partum procedures:none  Complications: None  Physical exam on 11/07/2018: Vitals:   11/06/18 1609 11/06/18 2024 11/06/18 2330 11/07/18 0737  BP: 101/70 113/70 (!) 110/57 115/64  Pulse: 83 70 77 82  Resp: 18 18 20 18   Temp: 98.6 F (37 C) 98.2 F (36.8 C) 98.2 F (36.8 C) 98.6 F (37 C)  TempSrc: Oral Oral Oral Oral  SpO2: 100% 98% 100% 99%  Weight:      Height:       General: alert and no distress Lochia: appropriate Uterine Fundus: firm DVT Evaluation: No evidence of DVT seen on physical exam.  Labs: Lab Results  Component Value Date   WBC 10.1 11/07/2018   HGB 10.3 (L) 11/07/2018   HCT 30.8 (L) 11/07/2018   MCV 83.0 11/07/2018   PLT 121 (L) 11/07/2018   CMP Latest Ref Rng & Units 07/18/2017  Glucose 65 - 99 mg/dL 98   BUN 6 - 20 mg/dL 14  Creatinine 3.53 - 6.14 mg/dL 4.31  Sodium 540 - 086 mmol/L 139  Potassium 3.5 - 5.1 mmol/L 3.6  Chloride 101 - 111 mmol/L 104  CO2 22 - 32 mmol/L 28  Calcium 8.9 - 10.3 mg/dL 9.8  Total Protein 6.4 - 8.6 g/dL -  Total Bilirubin 0.2 - 1.0 mg/dL -  Alkaline Phos Unit/L -  AST 0 - 26 Unit/L -  ALT 12 - 78 U/L -    Discharge instruction: per After Visit Summary.  Medications:  Allergies as of 11/07/2018      Reactions   Peanuts [peanut Oil] Anaphylaxis, Hives, Swelling   Tree nuts also    Mushroom Extract Complex       Medication List    TAKE these medications   Breast Pump Misc Dispense  one breast pump for patient   ferrous sulfate 325 (65 FE) MG tablet Take 325 mg by mouth daily with breakfast.   ibuprofen 600 MG tablet Commonly known as:  ADVIL,MOTRIN Take 1 tablet (600 mg total) by mouth every 6 (six) hours.   multivitamin-prenatal 27-0.8 MG Tabs tablet Take 1 tablet by mouth daily at 12 noon.       Diet: routine diet  Activity: Advance as tolerated. Pelvic rest for 6 weeks.   Outpatient follow up: Follow-up Information    Schuman, Christanna R, MD Follow up in 6 week(s).   Specialty:  Obstetrics and Gynecology Contact information: [address] [place] [address] [phone]             Postpartum contraception: IUD Mirena Rhogam Given postpartum: no Rubella vaccine given postpartum: no Varicella vaccine given postpartum: no TDaP given antepartum or postpartum: No  Newborn Data: Live born child  Birth Weight:   APGAR: ,   Newborn Delivery   Birth date/time:  11/06/2018 05:15:00 Delivery type:  Vaginal, Spontaneous      Baby Feeding: Breast  Disposition:home with mother  SIGNED:  Vena Austria, MD 11/07/2018 8:51 AM

## 2018-11-06 NOTE — Progress Notes (Signed)
AROM, clear, scant fluid SVE: 5/80/-3 No issues, pain controlled without medication at this time Category 1 FHRT  Adelene Idler MD [facility], [facility] 11/06/2018 3:13 AM

## 2018-11-06 NOTE — Lactation Note (Signed)
This note was copied from a baby's chart. Lactation Consultation Note  Patient Name: [name] [id number] Date: 11/06/2018 Reason for consult: Initial assessment   Maternal Data    Feeding Feeding Type: Breast Fed  LATCH Score Latch: Grasps breast easily, tongue down, lips flanged, rhythmical sucking.  Audible Swallowing: Spontaneous and intermittent  Type of Nipple: Everted at rest and after stimulation  Comfort (Breast/Nipple): Soft / non-tender  Hold (Positioning): Assistance needed to correctly position infant at breast and maintain latch.  LATCH Score: 9  Interventions Interventions: Breast feeding basics reviewed;Assisted with latch;Adjust position;Support pillows  Lactation Tools Discussed/Used     Consult Status Consult Status: Follow-up Date: 11/06/18 [facility] assisted mother with latch and positioning of infant for breastfeeding. Mother states that she breast-fed her last child for 3 months. Infant was placed in cradle position and easily latched on to her left breast. Mother was taught hand expression, the importance of skin to skin, infant feeding cues, and frequency of wet and dirty diapers.    [name] 11/06/2018, 12:32 PM

## 2018-11-06 NOTE — Discharge Instructions (Signed)
Vaginal Delivery, Care After °Refer to this sheet in the next few weeks. These instructions provide you with information about caring for yourself after vaginal delivery. Your health care provider may also give you more specific instructions. Your treatment has been planned according to current medical practices, but problems sometimes occur. Call your health care provider if you have any problems or questions. °What can I expect after the procedure? °After vaginal delivery, it is common to have: °· Some bleeding from your vagina. °· Soreness in your abdomen, your vagina, and the area of skin between your vaginal opening and your anus (perineum). °· Pelvic cramps. °· Fatigue. °Follow these instructions at home: °Medicines °· Take over-the-counter and prescription medicines only as told by your health care provider. °· If you were prescribed an antibiotic medicine, take it as told by your health care provider. Do not stop taking the antibiotic until it is finished. °Driving ° °· Do not drive or operate heavy machinery while taking prescription pain medicine. °· Do not drive for 24 hours if you received a sedative. °Lifestyle °· Do not drink alcohol. This is especially important if you are breastfeeding or taking medicine to relieve pain. °· Do not use tobacco products, including cigarettes, chewing tobacco, or e-cigarettes. If you need help quitting, ask your health care provider. °Eating and drinking °· Drink at least 8 eight-ounce glasses of water every day unless you are told not to by your health care provider. If you choose to breastfeed your baby, you may need to drink more water than this. °· Eat high-fiber foods every day. These foods may help prevent or relieve constipation. High-fiber foods include: °? Whole grain cereals and breads. °? Brown rice. °? Beans. °? Fresh fruits and vegetables. °Activity °· Return to your normal activities as told by your health care provider. Ask your health care provider what  activities are safe for you. °· Rest as much as possible. Try to rest or take a nap when your baby is sleeping. °· Do not lift anything that is heavier than your baby or 10 lb (4.5 kg) until your health care provider says that it is safe. °· Talk with your health care provider about when you can engage in sexual activity. This may depend on your: °? Risk of infection. °? Rate of healing. °? Comfort and desire to engage in sexual activity. °Vaginal Care °· If you have an episiotomy or a vaginal tear, check the area every day for signs of infection. Check for: °? More redness, swelling, or pain. °? More fluid or blood. °? Warmth. °? Pus or a bad smell. °· Do not use tampons or douches until your health care provider says this is safe. °· Watch for any blood clots that may pass from your vagina. These may look like clumps of dark red, brown, or black discharge. °General instructions °· Keep your perineum clean and dry as told by your health care provider. °· Wear loose, comfortable clothing. °· Wipe from front to back when you use the toilet. °· Ask your health care provider if you can shower or take a bath. If you had an episiotomy or a perineal tear during labor and delivery, your health care provider may tell you not to take baths for a certain length of time. °· Wear a bra that supports your breasts and fits you well. °· If possible, have someone help you with household activities and help care for your baby for at least a few days after you   leave the hospital. °· Keep all follow-up visits for you and your baby as told by your health care provider. This is important. °Contact a health care provider if: °· You have: °? Vaginal discharge that has a bad smell. °? Difficulty urinating. °? Pain when urinating. °? A sudden increase or decrease in the frequency of your bowel movements. °? More redness, swelling, or pain around your episiotomy or vaginal tear. °? More fluid or blood coming from your episiotomy or vaginal  tear. °? Pus or a bad smell coming from your episiotomy or vaginal tear. °? A fever. °? A rash. °? Little or no interest in activities you used to enjoy. °? Questions about caring for yourself or your baby. °· Your episiotomy or vaginal tear feels warm to the touch. °· Your episiotomy or vaginal tear is separating or does not appear to be healing. °· Your breasts are painful, hard, or turn red. °· You feel unusually sad or worried. °· You feel nauseous or you vomit. °· You pass large blood clots from your vagina. If you pass a blood clot from your vagina, save it to show to your health care provider. Do not flush blood clots down the toilet without having your health care provider look at them. °· You urinate more than usual. °· You are dizzy or light-headed. °· You have not breastfed at all and you have not had a menstrual period for 12 weeks after delivery. °· You have stopped breastfeeding and you have not had a menstrual period for 12 weeks after you stopped breastfeeding. °Get help right away if: °· You have: °? Pain that does not go away or does not get better with medicine. °? Chest pain. °? Difficulty breathing. °? Blurred vision or spots in your vision. °? Thoughts about hurting yourself or your baby. °· You develop pain in your abdomen or in one of your legs. °· You develop a severe headache. °· You faint. °· You bleed from your vagina so much that you fill two sanitary pads in one hour. °This information is not intended to replace advice given to you by your health care provider. Make sure you discuss any questions you have with your health care provider. °Document Released: 08/09/2000 Document Revised: 01/24/2016 Document Reviewed: 08/27/2015 °Elsevier Interactive Patient Education © 2019 [employer] ° °

## 2018-11-07 LAB — CBC
HEMATOCRIT: 30.8 % — AB (ref 36.0–46.0)
Hemoglobin: 10.3 g/dL — ABNORMAL LOW (ref 12.0–15.0)
MCH: 27.8 pg (ref 26.0–34.0)
MCHC: 33.4 g/dL (ref 30.0–36.0)
MCV: 83 fL (ref 80.0–100.0)
Platelets: 121 10*3/uL — ABNORMAL LOW (ref 150–400)
RBC: 3.71 MIL/uL — ABNORMAL LOW (ref 3.87–5.11)
RDW: 14.1 % (ref 11.5–15.5)
WBC: 10.1 10*3/uL (ref 4.0–10.5)
nRBC: 0 % (ref 0.0–0.2)

## 2018-11-07 LAB — RPR: RPR Ser Ql: NONREACTIVE

## 2018-11-07 MED ORDER — IBUPROFEN 600 MG PO TABS: 600.0000 mg | ORAL_TABLET | Freq: Four times a day (QID) | ORAL | 0 refills | Status: DC

## 2018-11-07 NOTE — Progress Notes (Signed)
Discharge instructions given. Patient verbalizes understanding of teaching. Patient discharged home via wheelchair at 1315. 

## 2018-12-17 ENCOUNTER — Other Ambulatory Visit: Payer: Self-pay

## 2018-12-17 ENCOUNTER — Encounter: Payer: Self-pay | Admitting: Obstetrics and Gynecology

## 2018-12-17 ENCOUNTER — Ambulatory Visit (INDEPENDENT_AMBULATORY_CARE_PROVIDER_SITE_OTHER): Payer: 59 | Admitting: Obstetrics and Gynecology

## 2018-12-17 DIAGNOSIS — Z30430 Encounter for insertion of intrauterine contraceptive device: Secondary | ICD-10-CM | POA: Diagnosis not present

## 2018-12-17 DIAGNOSIS — Z1389 Encounter for screening for other disorder: Secondary | ICD-10-CM

## 2018-12-17 NOTE — Patient Instructions (Signed)

## 2018-12-17 NOTE — Progress Notes (Signed)
  OBSTETRICS POSTPARTUM CLINIC PROGRESS NOTE  Subjective:     [name] is a 23 y.o. G12P2002 female who presents for a postpartum visit. She is 6 weeks postpartum following a Term pregnancy and delivery by Vaginal, no problems at delivery.  I have fully reviewed the prenatal and intrapartum course. Anesthesia: none.  Postpartum course has been complicated by uncomplicated.  Baby is feeding by Breast.  Bleeding: patient has not  resumed menses.  Bowel function is normal. Bladder function is normal.  Patient is not sexually active. Contraception method desired is IUD.  Postpartum depression screening: negative. [place] 8.  The following portions of the patient's history were reviewed and updated as appropriate: allergies, current medications, past family history, past medical history, past social history, past surgical history and problem list.  Review of Systems Pertinent items are noted in HPI.  Objective:    BP (!) 110/56   Ht 5\' 10"  (1.778 m)   Wt 145 lb (65.8 kg)   Breastfeeding Yes   BMI 20.81 kg/m   General:  alert and no distress   Breasts:  inspection negative, no nipple discharge or bleeding, no masses or nodularity palpable  Lungs: clear to auscultation bilaterally  Heart:  regular rate and rhythm, S1, S2 normal, no murmur, click, rub or gallop  Abdomen: soft, non-tender; bowel sounds normal; no masses,  no organomegaly.    Vulva:  normal  Vagina: normal vagina, no discharge, exudate, lesion, or erythema  Cervix:  no cervical motion tenderness and no lesions  Corpus: normal size, contour, position, consistency, mobility, non-tender  Adnexa:  normal adnexa and no mass, fullness, tenderness  Rectal Exam: Not performed.        Small bumps on vulva and suprapubic area- could be reaction to shaving cream or   IUD PROCEDURE NOTE:  [name] is a 23 y.o. [id number] here for IUD insertion. No GYN concerns.  Last pap smear was normal.  IUD Insertion Procedure  Note Patient identified, informed consent performed, consent signed.   Discussed risks of irregular bleeding, cramping, infection, malpositioning or misplacement of the IUD outside the uterus which may require further procedure such as laparoscopy, risk of failure <1%. Time out was performed.  Urine pregnancy test negative.  A bimanual exam showed the uterus to be anteverted.  Speculum placed in the vagina.  Cervix visualized.  Cleaned with Betadine x 2.  Grasped anteriorly with a single tooth tenaculum.  Uterus sounded to 7 cm.   IUD placed per manufacturer's recommendations.  Strings trimmed to 3 cm. Tenaculum was removed, good hemostasis noted.  Patient tolerated procedure well.   Patient was given post-procedure instructions.  She was advised to have backup contraception for one week.  Patient was also asked to check IUD strings periodically and follow up in 4 weeks for IUD check.  Assessment:  Post Partum Care visit 23 yo s/p SVD  Plan:  See orders and Patient Instructions Follow up in: 1 month or as needed.   Adelene Idler MD [facility], [facility] 12/17/2018 5:20 PM

## 2018-12-18 ENCOUNTER — Ambulatory Visit: Payer: 59 | Admitting: Obstetrics and Gynecology

## 2018-12-25 ENCOUNTER — Ambulatory Visit: Payer: 59 | Admitting: Obstetrics and Gynecology

## 2019-01-13 ENCOUNTER — Ambulatory Visit: Payer: 59 | Admitting: Obstetrics and Gynecology

## 2019-01-15 ENCOUNTER — Ambulatory Visit: Payer: 59 | Admitting: Obstetrics and Gynecology

## 2019-01-15 ENCOUNTER — Ambulatory Visit (INDEPENDENT_AMBULATORY_CARE_PROVIDER_SITE_OTHER): Payer: 59 | Admitting: Obstetrics and Gynecology

## 2019-01-15 ENCOUNTER — Encounter: Payer: Self-pay | Admitting: Obstetrics and Gynecology

## 2019-01-15 ENCOUNTER — Other Ambulatory Visit (HOSPITAL_COMMUNITY)
Admission: RE | Admit: 2019-01-15 | Discharge: 2019-01-15 | Disposition: A | Discharge status: Home / Self Care | Payer: 59 | Source: Ambulatory Visit | Attending: Obstetrics and Gynecology | Admitting: Obstetrics and Gynecology

## 2019-01-15 ENCOUNTER — Other Ambulatory Visit: Payer: Self-pay

## 2019-01-15 VITALS — BP 116/62 | HR 69 | Ht 70.0 in | Wt 141.0 lb

## 2019-01-15 DIAGNOSIS — D649 Anemia, unspecified: Secondary | ICD-10-CM

## 2019-01-15 DIAGNOSIS — N9089 Other specified noninflammatory disorders of vulva and perineum: Secondary | ICD-10-CM | POA: Diagnosis present

## 2019-01-15 NOTE — Progress Notes (Signed)
History of Present Illness:  [name] is a 23 y.o. that had a Mirena IUD placed approximately 4 weeks ago. Since that time, she states that she has had no bleeding, discharge and pain. She has been feeling very tired and craving ice. She wonders if she might be anemic.   PMHx: She  has a past medical history of Depression, Heart murmur, and Sickle cell trait (HCC). Also,  has a past surgical history that includes No past surgeries., family history includes Healthy in her father and mother; Hypertension in her maternal grandmother.,  reports that she has never smoked. She has never used smokeless tobacco. She reports previous alcohol use. She reports that she does not use drugs. Current Meds  Medication Sig  . Misc. Devices (BREAST PUMP) MISC Dispense one breast pump for patient  . Prenatal Vit-Fe Fumarate-FA (MULTIVITAMIN-PRENATAL) 27-0.8 MG TABS tablet Take 1 tablet by mouth daily at 12 noon.  .  Also, is allergic to peanuts [peanut oil] and mushroom extract complex..  Review of Systems  Constitutional: Positive for malaise/fatigue. Negative for chills, fever and weight loss.  HENT: Negative for congestion, hearing loss and sinus pain.   Eyes: Negative for blurred vision and double vision.  Respiratory: Negative for cough, sputum production, shortness of breath and wheezing.   Cardiovascular: Negative for chest pain, palpitations, orthopnea and leg swelling.  Gastrointestinal: Negative for abdominal pain, constipation, diarrhea, nausea and vomiting.  Genitourinary: Negative for dysuria, flank pain, frequency, hematuria and urgency.  Musculoskeletal: Negative for back pain, falls and joint pain.  Skin: Negative for itching and rash.  Neurological: Negative for dizziness and headaches.  Psychiatric/Behavioral: Negative for depression, substance abuse and suicidal ideas. The patient is not nervous/anxious.     Physical Exam:  BP 116/62   Pulse 69   Ht 5\' 10"  (1.778 m)   Wt 141 lb  (64 kg)   Breastfeeding Yes   BMI 20.23 kg/m  Body mass index is 20.23 kg/m. Constitutional: Well nourished, well developed female in no acute distress.  Abdomen: diffusely non tender to palpation, non distended, and no masses, hernias Neuro: Grossly intact Psych:  Normal mood and affect.    Pelvic exam:  Two IUD strings present seen coming from the cervical os. EGBUS, vaginal vault and cervix: within normal limits Vulvar skin lesions on perineum and mons pubis.Pearly white with central umbilication.   VULVAR BIOPSY NOTE The indications for vulvar biopsy (rule out neoplasia, establish lichen sclerosus,  Genital wart vs other etiology diagnosis) were reviewed.   Risks of the biopsy including pain, bleeding, infection, inadequate specimen, scarring and need for additional procedures  were discussed. The patient stated understanding and agreed to undergo procedure today. Consent was signed,  time out performed.   The patient's vulva was prepped with Betadine. 1% lidocaine was injected into area of concern. A 3 -mm punch biopsy was done, biopsy tissue was picked up with sterile forceps and sterile scissors were used to excise the lesion.  Small bleeding was noted and hemostasis was achieved using silver nitrate sticks.  The patient tolerated the procedure well. Post-procedure instructions  (pelvic rest for one week) were given to the patient. The patient is to call with heavy bleeding, fever greater than 100.4, foul smelling vaginal discharge or other concerns.    Assessment: IUD strings present in proper location; pt doing well  Plan: She was told to continue to use barrier contraception, in order to prevent any STIs, and to take a home pregnancy test or  call us if she ever thinks she may be pregnant, and that her IUD expires in 5 years.  Will obtain CBC and anemia panel. Discussed iron supplementation and dietary sources of iron.   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   She was amenable to this plan and we will see her back in 1 year/PRN.  A total of 25 minutes were spent face-to-face with the patient during this encounter and over half of that time dealt with counseling and coordination of care.   Adelene Idler MD [facility], [facility] 01/15/2019 3:11 PM

## 2019-01-16 LAB — IRON AND TIBC
Iron Saturation: 23 % (ref 15–55)
Iron: 68 ug/dL (ref 27–159)
Total Iron Binding Capacity: 299 ug/dL (ref 250–450)
UIBC: 231 ug/dL (ref 131–425)

## 2019-01-16 LAB — CBC
Hematocrit: 37.1 % (ref 34.0–46.6)
Hemoglobin: 12.5 g/dL (ref 11.1–15.9)
MCH: 27.8 pg (ref 26.6–33.0)
MCHC: 33.7 g/dL (ref 31.5–35.7)
MCV: 82 fL (ref 79–97)
Platelets: 197 10*3/uL (ref 150–450)
RBC: 4.5 x10E6/uL (ref 3.77–5.28)
RDW: 13.4 % (ref 11.7–15.4)
WBC: 5.5 10*3/uL (ref 3.4–10.8)

## 2019-01-16 LAB — FOLATE: Folate: >20 ng/mL (ref >3.0)

## 2019-01-16 LAB — VITAMIN B12: Vitamin B-12: 359 pg/mL (ref 232–1245)

## 2019-01-16 LAB — FERRITIN: Ferritin: 90 ng/mL (ref 15–150)

## 2019-01-19 ENCOUNTER — Encounter: Payer: Self-pay | Admitting: Obstetrics and Gynecology

## 2019-01-19 NOTE — Progress Notes (Signed)
Not anemic, Released to [employer]

## 2019-01-25 ENCOUNTER — Telehealth: Payer: Self-pay | Admitting: Obstetrics and Gynecology

## 2019-01-25 NOTE — Telephone Encounter (Signed)
Please advise, Thank you.

## 2019-01-25 NOTE — Telephone Encounter (Signed)
Patient is returning Call from Dr. Jerene Pitch. Please advise

## 2019-01-25 NOTE — Progress Notes (Signed)
Molluscum contagiosum. Called and left message to return call and/or check mychart for result note.

## 2019-01-29 ENCOUNTER — Other Ambulatory Visit: Payer: Self-pay | Admitting: Obstetrics and Gynecology

## 2019-01-29 NOTE — Telephone Encounter (Signed)
Called patient 01/29/2019, she did not answer, left message

## 2019-02-12 ENCOUNTER — Other Ambulatory Visit: Payer: Self-pay

## 2019-02-12 ENCOUNTER — Encounter: Payer: Self-pay | Admitting: Obstetrics and Gynecology

## 2019-02-12 ENCOUNTER — Ambulatory Visit (INDEPENDENT_AMBULATORY_CARE_PROVIDER_SITE_OTHER): Payer: 59 | Admitting: Obstetrics and Gynecology

## 2019-02-12 VITALS — BP 100/60 | Ht 70.0 in | Wt 140.8 lb

## 2019-02-12 DIAGNOSIS — Z9889 Other specified postprocedural states: Secondary | ICD-10-CM

## 2019-02-12 DIAGNOSIS — B081 Molluscum contagiosum: Secondary | ICD-10-CM

## 2019-02-12 MED ORDER — LIDOCAINE 2 % EX GEL: 1.0000 | Freq: Three times a day (TID) | CUTANEOUS | 0 refills | Status: DC | PRN

## 2019-02-12 NOTE — Progress Notes (Signed)
PROCEDURE NOTE  Physical Exam Genitourinary:        Reasons and therapeutic options for removing skin lesions which have been confirmed to be molloscum contagiosum discussed. Patient would like to proceed with cryotherapy for removal of skin lesions. Verbal consent for the procedure was obtained.  Liquid nitrogen applied for cryotherapy to 11 lesion on the mons pubis and perineum for 10 seconds. Patient tolerated procedure well. Previous biopsy site is well healed.   Given rx for lidocain gel to use as needed for pain.  Will follow up in 2 weeks   [place], [facility] 02/15/2019 10:02 AM

## 2019-03-01 ENCOUNTER — Ambulatory Visit: Payer: 59 | Admitting: Obstetrics and Gynecology

## 2019-03-12 ENCOUNTER — Ambulatory Visit (INDEPENDENT_AMBULATORY_CARE_PROVIDER_SITE_OTHER): Payer: 59 | Admitting: Obstetrics and Gynecology

## 2019-03-12 ENCOUNTER — Encounter: Payer: Self-pay | Admitting: Obstetrics and Gynecology

## 2019-03-12 ENCOUNTER — Other Ambulatory Visit: Payer: Self-pay

## 2019-03-12 VITALS — BP 110/62 | HR 76 | Ht 70.0 in | Wt 139.0 lb

## 2019-03-12 DIAGNOSIS — B081 Molluscum contagiosum: Secondary | ICD-10-CM

## 2019-03-12 DIAGNOSIS — Z9889 Other specified postprocedural states: Secondary | ICD-10-CM

## 2019-03-12 NOTE — Progress Notes (Signed)
Patient ID: [name], female   DOB: [birth year], 23 y.o.   MRN: [id number]  Reason for Consult: Follow-up   Referred by Trinna Post, PA-C  Subjective:     HPI:  [name] is a 23 y.o. female . She is following up from a cryotherapy treatment for molloscum 1 month ado. She has no complaints and is feeling well. Reports lesions have healed.   Past Medical History:  Diagnosis Date  . Depression   . Heart murmur   . Sickle cell trait (HCC)    Family History  Problem Relation Age of Onset  . Hypertension Maternal Grandmother   . Healthy Mother   . Healthy Father    Past Surgical History:  Procedure Laterality Date  . NO PAST SURGERIES      Short Social History:  Social History   Tobacco Use  . Smoking status: Never Smoker  . Smokeless tobacco: Never Used  Substance Use Topics  . Alcohol use: Not Currently    Allergies  Allergen Reactions  . Peanuts [Peanut Oil] Anaphylaxis, Hives and Swelling    Tree nuts also   . Mushroom Extract Complex     Current Outpatient Medications  Medication Sig Dispense Refill  . ferrous sulfate 325 (65 FE) MG tablet Take 325 mg by mouth daily with breakfast.    . Misc. Devices (BREAST PUMP) MISC Dispense one breast pump for patient 1 each 0  . Prenatal Vit-Fe Fumarate-FA (MULTIVITAMIN-PRENATAL) 27-0.8 MG TABS tablet Take 1 tablet by mouth daily at 12 noon.    Marland Kitchen ibuprofen (ADVIL,MOTRIN) 600 MG tablet Take 1 tablet (600 mg total) by mouth every 6 (six) hours. (Patient not taking: Reported on 03/12/2019) 30 tablet 0   No current facility-administered medications for this visit.     Review of Systems  Constitutional: Negative for chills, fatigue, fever and unexpected weight change.  HENT: Negative for trouble swallowing.  Eyes: Negative for loss of vision.  Respiratory: Negative for cough, shortness of breath and wheezing.  Cardiovascular: Negative for chest pain, leg swelling, palpitations and syncope.  GI:  Negative for abdominal pain, blood in stool, diarrhea, nausea and vomiting.  GU: Negative for difficulty urinating, dysuria, frequency and hematuria.  Musculoskeletal: Negative for back pain, leg pain and joint pain.  Skin: Negative for rash.  Neurological: Negative for dizziness, headaches, light-headedness, numbness and seizures.  Psychiatric: Negative for behavioral problem, confusion, depressed mood and sleep disturbance.        Objective:  Objective   Vitals:   03/12/19 1500  BP: 110/62  Pulse: 76  Weight: 139 lb (63 kg)  Height: 5\' 10"  (1.778 m)   Body mass index is 19.94 kg/m.  Physical Exam Vitals signs and nursing note reviewed.  Constitutional:      Appearance: She is well-developed.  HENT:     Head: Normocephalic and atraumatic.  Eyes:     Pupils: Pupils are equal, round, and reactive to light.  Cardiovascular:     Rate and Rhythm: Normal rate and regular rhythm.  Pulmonary:     Effort: Pulmonary effort is normal. No respiratory distress.  Genitourinary:    General: Normal vulva.     Comments: Locations of cryotherapy all well healed. No new lesions.  Skin:    General: Skin is warm and dry.  Neurological:     Mental Status: She is alert and oriented to person, place, and time.  Psychiatric:        Behavior: Behavior normal.  Thought Content: Thought content normal.        Judgment: Judgment normal.        Assessment/Plan:     23 yo following up after cyrotherapy.  Lesions healed.  No additional treatment needed, follow up for annual 1 year.      Natale Milchhristanna R Clayborne Divis MD [facility]

## 2019-04-11 ENCOUNTER — Other Ambulatory Visit: Payer: Self-pay | Admitting: Obstetrics and Gynecology

## 2019-10-06 ENCOUNTER — Other Ambulatory Visit: Payer: Self-pay

## 2019-10-06 ENCOUNTER — Emergency Department
Admission: EM | Admit: 2019-10-06 | Discharge: 2019-10-06 | Disposition: A | Discharge status: Home / Self Care | Payer: 59 | Attending: Emergency Medicine | Admitting: Emergency Medicine

## 2019-10-06 DIAGNOSIS — F32A Depression, unspecified: Secondary | ICD-10-CM

## 2019-10-06 DIAGNOSIS — R45851 Suicidal ideations: Secondary | ICD-10-CM | POA: Diagnosis not present

## 2019-10-06 DIAGNOSIS — Y939 Activity, unspecified: Secondary | ICD-10-CM | POA: Insufficient documentation

## 2019-10-06 DIAGNOSIS — Y999 Unspecified external cause status: Secondary | ICD-10-CM | POA: Insufficient documentation

## 2019-10-06 DIAGNOSIS — S61512A Laceration without foreign body of left wrist, initial encounter: Secondary | ICD-10-CM | POA: Diagnosis not present

## 2019-10-06 DIAGNOSIS — X789XXA Intentional self-harm by unspecified sharp object, initial encounter: Secondary | ICD-10-CM | POA: Diagnosis not present

## 2019-10-06 DIAGNOSIS — Y929 Unspecified place or not applicable: Secondary | ICD-10-CM | POA: Insufficient documentation

## 2019-10-06 DIAGNOSIS — F329 Major depressive disorder, single episode, unspecified: Secondary | ICD-10-CM | POA: Diagnosis present

## 2019-10-06 DIAGNOSIS — Z79899 Other long term (current) drug therapy: Secondary | ICD-10-CM | POA: Insufficient documentation

## 2019-10-06 LAB — CBC
HCT: 40.6 % (ref 36.0–46.0)
Hemoglobin: 13.6 g/dL (ref 12.0–15.0)
MCH: 28 pg (ref 26.0–34.0)
MCHC: 33.5 g/dL (ref 30.0–36.0)
MCV: 83.5 fL (ref 80.0–100.0)
Platelets: 230 10*3/uL (ref 150–400)
RBC: 4.86 MIL/uL (ref 3.87–5.11)
RDW: 12.4 % (ref 11.5–15.5)
WBC: 5.7 10*3/uL (ref 4.0–10.5)
nRBC: 0 % (ref 0.0–0.2)

## 2019-10-06 LAB — COMPREHENSIVE METABOLIC PANEL
ALT: 12 U/L (ref 0–44)
AST: 17 U/L (ref 15–41)
Albumin: 4.2 g/dL (ref 3.5–5.0)
Alkaline Phosphatase: 83 U/L (ref 38–126)
Anion gap: 9 (ref 5–15)
BUN: 8 mg/dL (ref 6–20)
CO2: 26 mmol/L (ref 22–32)
Calcium: 9.6 mg/dL (ref 8.9–10.3)
Chloride: 104 mmol/L (ref 98–111)
Creatinine, Ser: 0.67 mg/dL (ref 0.44–1.00)
GFR calc Af Amer: >60 mL/min (ref >60)
GFR calc non Af Amer: >60 mL/min (ref >60)
Glucose, Bld: 106 mg/dL — ABNORMAL HIGH (ref 70–99)
Potassium: 3.4 mmol/L — ABNORMAL LOW (ref 3.5–5.1)
Sodium: 139 mmol/L (ref 135–145)
Total Bilirubin: 1.2 mg/dL (ref 0.3–1.2)
Total Protein: 7.8 g/dL (ref 6.5–8.1)

## 2019-10-06 LAB — URINE DRUG SCREEN, QUALITATIVE (ARMC ONLY)
Amphetamines, Ur Screen: NOT DETECTED
Barbiturates, Ur Screen: NOT DETECTED
Benzodiazepine, Ur Scrn: NOT DETECTED
Cannabinoid 50 Ng, Ur ~~LOC~~: NOT DETECTED
Cocaine Metabolite,Ur ~~LOC~~: NOT DETECTED
MDMA (Ecstasy)Ur Screen: NOT DETECTED
Methadone Scn, Ur: NOT DETECTED
Opiate, Ur Screen: NOT DETECTED
Phencyclidine (PCP) Ur S: NOT DETECTED
Tricyclic, Ur Screen: NOT DETECTED

## 2019-10-06 LAB — SALICYLATE LEVEL: Salicylate Lvl: <7 mg/dL — ABNORMAL LOW (ref 7.0–30.0)

## 2019-10-06 LAB — POCT PREGNANCY, URINE: Preg Test, Ur: NEGATIVE

## 2019-10-06 LAB — ACETAMINOPHEN LEVEL: Acetaminophen (Tylenol), Serum: <10 ug/mL — ABNORMAL LOW (ref 10–30)

## 2019-10-06 LAB — ETHANOL: Alcohol, Ethyl (B): <10 mg/dL (ref <10)

## 2019-10-06 MED ORDER — LORAZEPAM 1 MG PO TABS: 1.0000 mg | ORAL_TABLET | Freq: Once | ORAL | Status: DC

## 2019-10-06 NOTE — ED Notes (Signed)
Supper tray placed on bed, pt remains in interview room with TTS and psych MD.

## 2019-10-06 NOTE — ED Notes (Signed)
Psych and TTS at bedside. 

## 2019-10-06 NOTE — ED Notes (Signed)
Pt was dressed out by NT Adianna Darwin and NT Caitlyn . The following items were placed in belongings bag:  Blue and white purse Red Star [employer] long sleeve Grey sweat pants with holes Blue socks Grey and white Nike shoes Denim [employer] frame glasses, kept on patients face Teal colored columbia jacket Black bra White hair tie, kept in patients hair [place] underwear  White necklace in specimen cup    Patient stated that she is spotting, pt applied pad in underwear in triage bathroom with supervision of NT Caitlyn

## 2019-10-06 NOTE — ED Notes (Signed)
Pt states that she has been feeling depressed x few months but worse a few weeks ago. States a lot of stress recently. States has 2 kids at home, lives with mom. States mom works 60hrs a week and isn't able to help with kids. 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 Has never been on meds for depression. States she's in a group chat and someone out of state notified police that patient is depressed. States that PD was taking her to [facility] to be evaluated and she pulled out glass and began to cut self for SI feelings. States hx of same. Pt denies HI. Pt is worried about getting home to kids tonight. Explained about IVC process and assessment process to patient. Pt is calm and cooperative through out this RN's assessment. A&O, steady gait noted.

## 2019-10-06 NOTE — ED Notes (Signed)
Pt given chocolate ice cream and sprite. Pt appears anxious. Stated that the patients acting out around her are making are panic. Pt states she is not in full block panic attack and denied wanting to try medicine to decrease anxiety at this time. Wrapped warm blanket around shoulder at this time and sitting in chair beside bed.

## 2019-10-06 NOTE — ED Provider Notes (Signed)
[facility] Emergency Department Provider Note  ____________________________________________   I have reviewed the triage vital signs and the nursing notes.   HISTORY  Chief Complaint Suicidal   History limited by: Not Limited   HPI [name] is a 24 y.o. female who presents to the emergency department today under IVC because of concern for SI and wrist laceration. The patient states that she does not have a history of depression but did try to kill herself by slitting her wrists roughly 6 years ago. Today she states she was on a call when the other people on the call thought she was acting different. She states she was less energetic and less talkative then normal. They did call 911 for a wellness check and the patient slit her wrists. The patient states she was trying to harm herself. Denies any recent medical complaints.   Records reviewed. Per medical record review patient has a history of depression  Past Medical History:  Diagnosis Date  . Depression   . Heart murmur   . Sickle cell trait [facility])     Patient Active Problem List   Diagnosis Date Noted  . Encounter for planned induction of labor 11/06/2018  . Anemia affecting pregnancy, antepartum 10/02/2018  . Circumvallate placenta, unspecified trimester 09/04/2018  . Supervision of other normal pregnancy, antepartum 03/25/2018    Past Surgical History:  Procedure Laterality Date  . NO PAST SURGERIES      Prior to Admission medications   Medication Sig Start Date End Date Taking? Authorizing Provider  ferrous sulfate 325 (65 FE) MG tablet Take 325 mg by mouth daily with breakfast.    [provider]  ibuprofen (ADVIL,MOTRIN) 600 MG tablet Take 1 tablet (600 mg total) by mouth every 6 (six) hours. Patient not taking: Reported on 03/12/2019 11/07/18   Vena Austria, MD  Misc. Devices (BREAST PUMP) MISC Dispense one breast pump for patient 08/21/18   Vena Austria, MD   Prenatal Vit-Fe Fumarate-FA (MULTIVITAMIN-PRENATAL) 27-0.8 MG TABS tablet Take 1 tablet by mouth daily at 12 noon.    [provider]    Allergies Peanuts [peanut oil] and Mushroom extract complex  Family History  Problem Relation Age of Onset  . Hypertension Maternal Grandmother   . Healthy Mother   . Healthy Father     Social History Social History   Tobacco Use  . Smoking status: Never Smoker  . Smokeless tobacco: Never Used  Substance Use Topics  . Alcohol use: Not Currently  . Drug use: No    Review of Systems Constitutional: No fever/chills Eyes: No visual changes. ENT: No sore throat. Cardiovascular: Denies chest pain. Respiratory: Denies shortness of breath. Gastrointestinal: No abdominal pain.  No nausea, no vomiting.  No diarrhea.   Genitourinary: Negative for dysuria. Musculoskeletal: Negative for back pain. Skin: Positive for cuts to left wrist.  Neurological: Negative for headaches, focal weakness or numbness.  ____________________________________________   PHYSICAL EXAM:  VITAL SIGNS: ED Triage Vitals  Enc Vitals Group     BP 10/06/19 1355 113/75     Pulse Rate 10/06/19 1355 83     Resp 10/06/19 1355 17     Temp 10/06/19 1355 98.8 F (37.1 C)     Temp Source 10/06/19 1355 Oral     SpO2 10/06/19 1355 100 %     Weight 10/06/19 1356 140 lb (63.5 kg)     Height 10/06/19 1356 5\' 10"  (1.778 m)     Head Circumference --  Peak Flow --      Pain Score 10/06/19 1356 2   Constitutional: Alert and oriented.  Eyes: Conjunctivae are normal.  ENT      Head: Normocephalic and atraumatic.      Nose: No congestion/rhinnorhea.      Mouth/Throat: Mucous membranes are moist.      Neck: No stridor. Hematological/Lymphatic/Immunilogical: No cervical lymphadenopathy. Cardiovascular: Normal rate, regular rhythm.  No murmurs, rubs, or gallops.  Respiratory: Normal respiratory effort without tachypnea nor retractions. Breath sounds are clear and  equal bilaterally. No wheezes/rales/rhonchi. Gastrointestinal: Soft and non tender. No rebound. No guarding.  Genitourinary: Deferred Musculoskeletal: Normal range of motion in all extremities. No lower extremity edema. Neurologic:  Normal speech and language. No gross focal neurologic deficits are appreciated.  Skin:  Superficial lacerations to left wrist.  Psychiatric: Depressed. Endorses SI. ____________________________________________    LABS (pertinent positives/negatives)  Acetaminophen, salicylate, ethanol below threshold CBC wbc 5.7, hgb 13.6, plt 230 CMP wnl except k 3.4, glu 106 UDS negative Upreg negative ____________________________________________   EKG  None  ____________________________________________    RADIOLOGY  None  ____________________________________________   PROCEDURES  Procedures  ____________________________________________   INITIAL IMPRESSION / ASSESSMENT AND PLAN / ED COURSE  Pertinent labs & imaging results that were available during my care of the patient were reviewed by me and considered in my medical decision making (see chart for details).   Patient presented to the emergency department today because of concern for SI. Patient did present under IVC. On exam patient did have superficial lacerations to the left wrist that do not require any advanced closure. Psychiatry evaluated and at this time feel patient is safe for discharge. They did rescind the IVC.  ____________________________________________   FINAL CLINICAL IMPRESSION(S) / ED DIAGNOSES  Final diagnoses:  Depression, unspecified depression type  Self-inflicted laceration of left wrist [facility])     Note: This dictation was prepared with Dragon dictation. Any transcriptional errors that result from this process are unintentional     Nance Pear, MD 10/06/19 1827
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
## 2019-10-06 NOTE — Discharge Instructions (Addendum)
Please seek medical attention for any high fevers, chest pain, shortness of breath, change in behavior, persistent vomiting, bloody stool or any other new or concerning symptoms.  

## 2019-10-06 NOTE — ED Notes (Signed)
IVC prior to arrival/ Exam to be completed by ER MD/ Consult pending order
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
## 2019-10-06 NOTE — Consult Note (Signed)
[facility] Consult   Reason for Consult: Depression and SI Referring Physician: Archie Balboa Patient Identification: [name] MRN:  [id number] Principal Diagnosis: <principal problem not specified> Diagnosis:  Active Problems:   * No active hospital problems. *   Total Time spent with patient: 45 minutes  Subjective:   [name] is a 24 y.o. female patient admitted with depression and SI and a suicidal gesture.  HPI: Patient is a 24 year old woman with a history of mood instability who presents with a suicidal gesture after having been taken by police to [place] due to his statements made to a friend online.  Patient reports that she was feeling down and reached out to a friend through an online messaging group thinking that it would be anonymous.  She had messaged her something along the lines of "yeah that sound like a good idea if I survive" referring to the fact that her depression may kill her.  Patient states that she did not mean this in an active way but was rather using this as a means to reach out for help.  She reports feeling frustrated and that is why she had scratched her wrist.  Upon evaluation the patient adamantly denied any SI.  she states that she does have depressed days and nondepressed days but reports that she stays living due to her children and her mother.  She adamantly denies any psychosis or manic symptoms.  Patient at this time is not engaged in psychiatric care, because she feels that medications are not helpful for her.  She is interested in getting a therapist as she found her last sessions of therapy to be quite helpful.  Patient reports feeling safe returning home today.  Mother was contacted for collateral: Mother feels that patient made this gesture as a way to reach out for help and does not believe her daughter to be actively suicidal.  She is understanding of the plan to discharge with outpatient services.  He states that she can stay at  home the next couple days to keep an eye on her as well as ensure she gets the help that she needs.  She feels safe having the patient return home  Past Psychiatric History: Patient has a history of mood instability.  She reports approximately 3 prior inpatient admissions for similar circumstances.  She reports that those admissions were not helpful and in fact started her on medications which she was eventually noncompliant with due to the side effects.  Risk to Self:   Risk to Others:   Prior Inpatient Therapy:   Prior Outpatient Therapy:    Past Medical History:  Past Medical History:  Diagnosis Date  . Depression   . Heart murmur   . Sickle cell trait [facility])     Past Surgical History:  Procedure Laterality Date  . NO PAST SURGERIES     Family History:  Family History  Problem Relation Age of Onset  . Hypertension Maternal Grandmother   . Healthy Mother   . Healthy Father    Family Psychiatric  History: Denies Social History:  Social History   Substance and Sexual Activity  Alcohol Use Not Currently     Social History   Substance and Sexual Activity  Drug Use No    Social History   Socioeconomic History  . Marital status: Single    Spouse name: Not on file  . Number of children: 1  . Years of education: 15.5  . Highest education level: Not on file  Occupational History  . Occupation: FULL TIME STUDENT    Comment: [employer]  Tobacco Use  . Smoking status: Never Smoker  . Smokeless tobacco: Never Used  Substance and Sexual Activity  . Alcohol use: Not Currently  . Drug use: No  . Sexual activity: Not Currently    Birth control/protection: I.U.D.    Comment: Mirena   Other Topics Concern  . Not on file  Social History Narrative  . Not on file   Social Determinants of Health   Financial Resource Strain:   . Difficulty of Paying Living Expenses: Not on file  Food Insecurity:   . Worried About Programme researcher, broadcasting/film/video in the Last Year: Not on file  . Ran Out of  Food in the Last Year: Not on file  Transportation Needs:   . Lack of Transportation (Medical): Not on file  . Lack of Transportation (Non-Medical): Not on file  Physical Activity:   . Days of Exercise per Week: Not on file  . Minutes of Exercise per Session: Not on file  Stress:   . Feeling of Stress : Not on file  Social Connections:   . Frequency of Communication with Friends and Family: Not on file  . Frequency of Social Gatherings with Friends and Family: Not on file  . Attends Religious Services: Not on file  . Active Member of Clubs or Organizations: Not on file  . Attends Banker Meetings: Not on file  . Marital Status: Not on file   Additional Social History:    Allergies:   Allergies  Allergen Reactions  . Peanuts [Peanut Oil] Anaphylaxis, Hives and Swelling    Tree nuts also   . Mushroom Extract Complex     Labs:  Results for orders placed or performed during the hospital encounter of 10/06/19 (from the past 48 hour(s))  Comprehensive metabolic panel     Status: Abnormal   Collection Time: 10/06/19  2:02 PM  Result Value Ref Range   Sodium 139 135 - 145 mmol/L   Potassium 3.4 (L) 3.5 - 5.1 mmol/L   Chloride 104 98 - 111 mmol/L   CO2 26 22 - 32 mmol/L   Glucose, Bld 106 (H) 70 - 99 mg/dL   BUN 8 6 - 20 mg/dL   Creatinine, Ser 1.19 0.44 - 1.00 mg/dL   Calcium 9.6 8.9 - 14.7 mg/dL   Total Protein 7.8 6.5 - 8.1 g/dL   Albumin 4.2 3.5 - 5.0 g/dL   AST 17 15 - 41 U/L   ALT 12 0 - 44 U/L   Alkaline Phosphatase 83 38 - 126 U/L   Total Bilirubin 1.2 0.3 - 1.2 mg/dL   GFR calc non Af Amer >60 >60 mL/min   GFR calc Af Amer >60 >60 mL/min   Anion gap 9 5 - 15    Comment: Performed at [facility], [address] [place], [place] [address]  Ethanol     Status: None   Collection Time: 10/06/19  2:02 PM  Result Value Ref Range   Alcohol, Ethyl (B) <10 <10 mg/dL    Comment: (NOTE) Lowest detectable limit for serum alcohol is 10 mg/dL. For  medical purposes only. Performed at [facility], [address] [place], [place] [address]   Salicylate level     Status: Abnormal   Collection Time: 10/06/19  2:02 PM  Result Value Ref Range   Salicylate Lvl <7.0 (L) 7.0 - 30.0 mg/dL    Comment: Performed  at [facility], [address] [place], [place] [address]  Acetaminophen level     Status: Abnormal   Collection Time: 10/06/19  2:02 PM  Result Value Ref Range   Acetaminophen (Tylenol), Serum <10 (L) 10 - 30 ug/mL    Comment: (NOTE) Therapeutic concentrations vary significantly. A range of 10-30 ug/mL  may be an effective concentration for many patients. However, some  are best treated at concentrations outside of this range. Acetaminophen concentrations >150 ug/mL at 4 hours after ingestion  and >50 ug/mL at 12 hours after ingestion are often associated with  toxic reactions. Performed at [facility], [address] [place], [place] [address]   cbc     Status: None   Collection Time: 10/06/19  2:02 PM  Result Value Ref Range   WBC 5.7 4.0 - 10.5 K/uL   RBC 4.86 3.87 - 5.11 MIL/uL   Hemoglobin 13.6 12.0 - 15.0 g/dL   HCT 83.1 51.7 - 61.6 %   MCV 83.5 80.0 - 100.0 fL   MCH 28.0 26.0 - 34.0 pg   MCHC 33.5 30.0 - 36.0 g/dL   RDW 07.3 71.0 - 62.6 %   Platelets 230 150 - 400 K/uL   nRBC 0.0 0.0 - 0.2 %    Comment: Performed at [facility], [address], [place], [place] [address]  Urine Drug Screen, Qualitative     Status: None   Collection Time: 10/06/19  2:02 PM  Result Value Ref Range   Tricyclic, Ur Screen NONE DETECTED NONE DETECTED   Amphetamines, Ur Screen NONE DETECTED NONE DETECTED   MDMA (Ecstasy)Ur Screen NONE DETECTED NONE DETECTED   Cocaine Metabolite,Ur [place] NONE DETECTED NONE DETECTED   Opiate, Ur Screen NONE DETECTED NONE DETECTED   Phencyclidine (PCP) Ur S NONE DETECTED NONE DETECTED   Cannabinoid 50 Ng, Ur [place] NONE DETECTED NONE DETECTED   Barbiturates, Ur  Screen NONE DETECTED NONE DETECTED   Benzodiazepine, Ur Scrn NONE DETECTED NONE DETECTED   Methadone Scn, Ur NONE DETECTED NONE DETECTED    Comment: (NOTE) Tricyclics + metabolites, urine    Cutoff 1000 ng/mL Amphetamines + metabolites, urine  Cutoff 1000 ng/mL MDMA (Ecstasy), urine              Cutoff 500 ng/mL Cocaine Metabolite, urine          Cutoff 300 ng/mL Opiate + metabolites, urine        Cutoff 300 ng/mL Phencyclidine (PCP), urine         Cutoff 25 ng/mL Cannabinoid, urine                 Cutoff 50 ng/mL Barbiturates + metabolites, urine  Cutoff 200 ng/mL Benzodiazepine, urine              Cutoff 200 ng/mL Methadone, urine                   Cutoff 300 ng/mL The urine drug screen provides only a preliminary, unconfirmed analytical test result and should not be used for non-medical purposes. Clinical consideration and professional judgment should be applied to any positive drug screen result due to possible interfering substances. A more specific alternate chemical method must be used in order to obtain a confirmed analytical result. Gas chromatography / mass spectrometry (GC/MS) is the preferred confirmat ory method. Performed at [facility], [address], [place], [place] [address]   Pregnancy, urine POC     Status: None  Collection Time: 10/06/19  3:05 PM  Result Value Ref Range   Preg Test, Ur NEGATIVE NEGATIVE    Comment:        THE SENSITIVITY OF THIS METHODOLOGY IS >24 mIU/mL     Current Facility-Administered Medications  Medication Dose Route Frequency Provider Last Rate Last Admin  . LORazepam (ATIVAN) tablet 1 mg  1 mg Oral Once Phineas Semen, MD   Stopped at 10/06/19 1625   Current Outpatient Medications  Medication Sig Dispense Refill  . ferrous sulfate 325 (65 FE) MG tablet Take 325 mg by mouth daily with breakfast.    . ibuprofen (ADVIL,MOTRIN) 600 MG tablet Take 1 tablet (600 mg total) by mouth every 6 (six) hours. (Patient not taking:  Reported on 03/12/2019) 30 tablet 0  . Misc. Devices (BREAST PUMP) MISC Dispense one breast pump for patient 1 each 0  . Prenatal Vit-Fe Fumarate-FA (MULTIVITAMIN-PRENATAL) 27-0.8 MG TABS tablet Take 1 tablet by mouth daily at 12 noon.      Musculoskeletal: Strength & Muscle Tone: within normal limits Gait & Station: normal Patient leans: N/A  Psychiatric Specialty Exam: Physical Exam  Review of Systems  Psychiatric/Behavioral: Positive for self-injury and suicidal ideas. Negative for agitation, behavioral problems and hallucinations.    Blood pressure 122/71, pulse 88, temperature 98.6 F (37 C), temperature source Oral, resp. rate 17, height 5\' 10"  (1.778 m), weight 63.5 kg, SpO2 100 %, currently breastfeeding.Body mass index is 20.09 kg/m.  General Appearance: Casual  Eye Contact:  Good  Speech:  Clear and Coherent  Volume:  Normal  Mood:  Anxious and Depressed  Affect:  Full Range  Thought Process:  Coherent  Orientation:  Full (Time, Place, and Person)  Thought Content:  Logical  Suicidal Thoughts:  No  Homicidal Thoughts:  No  Memory:  Recent;   Fair  Judgement:  Fair  Insight:  Fair  Psychomotor Activity:  Normal  Concentration:  Concentration: Fair  Recall:  of Knowledge:  Fair  Language:  Fair  Akathisia:  No  Handed:  Right  AIMS (if indicated):     Assets:  Communication Skills Desire for Improvement Physical Health Resilience  ADL's:  Intact  Cognition:  WNL  Sleep:        Treatment Plan Summary:  This is a 24 year old girl with a past psychiatric history notable for mood instability.  Patient reports no longer feeling suicidal despite making a claim as well as a gesture during the course of the day.  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  Furthermore  patient is not interested in obtaining medicinal help for this problem at this time and is denying feeling suicidal anymore.  For those reasons along with the fact that patient has had multiple prior inpatient hospitalizations which she deemed to not be helpful, inpatient hospitalization is deemed not necessary for today.  Furthermore patient's mother is able to collaborate her story as well as agreed to her been care for the patient until she can be engaged with therapy services.  IVC rescinded patient to be discharged with outpatient therapy resources.  Disposition: Patient does not meet criteria for psychiatric inpatient admission. Supportive therapy provided about ongoing stressors. Discussed crisis plan, support from social network, calling 911, coming to the Emergency Department, and calling Suicide Hotline.  24, MD 10/06/2019 7:17 PM

## 2019-11-05 ENCOUNTER — Telehealth: Payer: Self-pay | Admitting: *Deleted

## 2019-11-12 ENCOUNTER — Other Ambulatory Visit: Payer: Self-pay | Admitting: Physician Assistant

## 2019-11-12 ENCOUNTER — Ambulatory Visit (INDEPENDENT_AMBULATORY_CARE_PROVIDER_SITE_OTHER): Payer: 59 | Admitting: Physician Assistant

## 2019-11-12 ENCOUNTER — Encounter: Payer: Self-pay | Admitting: Physician Assistant

## 2019-11-12 ENCOUNTER — Other Ambulatory Visit: Payer: Self-pay

## 2019-11-12 VITALS — BP 120/70 | HR 76 | Temp 97.1°F | Ht 70.0 in | Wt 135.0 lb

## 2019-11-12 DIAGNOSIS — F32A Depression, unspecified: Secondary | ICD-10-CM

## 2019-11-12 DIAGNOSIS — F1010 Alcohol abuse, uncomplicated: Secondary | ICD-10-CM | POA: Diagnosis not present

## 2019-11-12 DIAGNOSIS — D509 Iron deficiency anemia, unspecified: Secondary | ICD-10-CM

## 2019-11-12 DIAGNOSIS — F329 Major depressive disorder, single episode, unspecified: Secondary | ICD-10-CM | POA: Diagnosis not present

## 2019-11-12 DIAGNOSIS — Z0000 Encounter for general adult medical examination without abnormal findings: Secondary | ICD-10-CM

## 2019-11-12 NOTE — Progress Notes (Signed)
Patient: [name], Female    DOB: 1996/01/06, 24 y.o.   MRN: [id number] Visit Date: 11/12/2019  Today's Provider: Trinna Post, PA-C   Chief Complaint  Patient presents with  . Annual Exam   Subjective:    Annual physical exam [name] is a 24 y.o. female who presents today for health maintenance and complete physical. She feels well. She reports exercising none. She reports she is sleeping well.  Two children, youngest is one. PAP smear up to date. She has a mirena IUD placed after most recent child. 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   She has had issues with depression most recently. She was IVC in 09/2019 at [facility] for suicide attempt which spurred a wellness check. Ultimately she was not admitted. She was discharged to outpatient counseling. She reports she had her first counseling session with a provider at her school this past Tuesday. She reports never being treated for depression previously. She declines medications at this time.   She drinks on Friday, Saturday and Sunday. She drinks a 750 mL bottle of BIcardi 151 rum starting at 12:00 noon. She waits until her children are sleeping and makes sure her mom is there to watch the children. After finishing the bottle of rum she will drink half a bottle of Rose. Then later in the night she will drink 4-5 Seagrams. She reports she has never been arrested related to alcohol and does not have a DWI. She reports she has fallen down drunk and sustained injuries from this previously. Reports she has been drinking this way since [age]. She stopped with her first pregnancy but then resumed. She reports it has most recently gotten bad in January.   She reports she had a history of iron deficiency anemia with pregnancy and would like this checked again.  ----------------------------------------------------------------- Last Pap:03/20/2018  Review of  Systems  Constitutional: Negative.   HENT: Negative.   Eyes: Negative.   Respiratory: Negative.   Cardiovascular: Negative.   Gastrointestinal: Negative.   Endocrine: Negative.   Genitourinary: Negative.   Musculoskeletal: Negative.   Skin: Negative.   Allergic/Immunologic: Positive for environmental allergies and food allergies.  Neurological: Negative.   Hematological: Negative.   Psychiatric/Behavioral: The patient is nervous/anxious.     Social History She  reports that she has never smoked. She has never used smokeless tobacco. She reports previous alcohol use. She reports that she does not use drugs. Social History   Socioeconomic History  . Marital status: Single    Spouse name: Not on file  . Number of children: 1  . Years of education: 15.5  . Highest education level: Not on file  Occupational History  . Occupation: FULL TIME STUDENT    Comment: [employer]  Tobacco Use  . Smoking status: Never Smoker  . Smokeless tobacco: Never Used  Substance and Sexual Activity  . Alcohol use: Not Currently  . Drug use: No  . Sexual activity: Not Currently    Birth control/protection: I.U.D.    Comment: Mirena   Other Topics Concern  . Not on file  Social History Narrative  . Not on file   Social Determinants of Health   Financial Resource Strain:   . Difficulty of Paying Living Expenses:   Food Insecurity:   . Worried About Charity fundraiser in the Last Year:   . [place] in the Last Year:  Transportation Needs:   . Film/video editor (Medical):   Marland Kitchen Lack of Transportation (Non-Medical):   Physical Activity:   . Days of Exercise per Week:   . Minutes of Exercise per Session:   Stress:   . Feeling of Stress :   Social Connections:   . Frequency of Communication with Friends and Family:   . Frequency of Social Gatherings with Friends and Family:   . Attends Religious Services:   . Active Member of Clubs or Organizations:   . Attends Archivist  Meetings:   Marland Kitchen Marital Status:     Patient Active Problem List   Diagnosis Date Noted  . Encounter for planned induction of labor 11/06/2018  . Anemia affecting pregnancy, antepartum 10/02/2018  . Circumvallate placenta, unspecified trimester 09/04/2018  . Depression 04/17/2018  . Supervision of other normal pregnancy, antepartum 03/25/2018    Past Surgical History:  Procedure Laterality Date  . NO PAST SURGERIES      Family History  Family Status  Relation Name Status  . MGM  Alive  . Mother  Alive  . Father  Alive   Her family history includes Healthy in her father and mother; Hypertension in her maternal grandmother.     Allergies  Allergen Reactions  . Peanuts [Peanut Oil] Anaphylaxis, Hives and Swelling    Tree nuts also   . Mushroom Extract Complex     Previous Medications   FERROUS SULFATE 325 (65 FE) MG TABLET    Take 325 mg by mouth daily with breakfast.   IBUPROFEN (ADVIL,MOTRIN) 600 MG TABLET    Take 1 tablet (600 mg total) by mouth every 6 (six) hours.   LEVONORGESTREL (MIRENA) 20 MCG/24HR IUD    1 each by Intrauterine route once.   MISC. DEVICES (BREAST PUMP) MISC    Dispense one breast pump for patient   PRENATAL VIT-FE FUMARATE-FA (MULTIVITAMIN-PRENATAL) 27-0.8 MG TABS TABLET    Take 1 tablet by mouth daily at 12 noon.    Patient Care Team: Paulene Floor as PCP - General (Physician Assistant)      Objective:   Vitals: BP 120/70 (BP Location: Left Arm, Patient Position: Sitting, Cuff Size: Normal)   Pulse 76   Temp (!) 97.1 F (36.2 C) (Temporal)   Ht _0  (1.778 m)   Wt 135 lb (61.2 kg)   SpO2 96%   BMI 19.37 kg/m    Physical Exam Constitutional:      Appearance: Normal appearance.  Cardiovascular:     Rate and Rhythm: Normal rate and regular rhythm.     Heart sounds: Normal heart sounds.  Pulmonary:     Effort: Pulmonary effort is normal.     Breath sounds: Normal breath sounds.  Abdominal:     General: Bowel sounds are  normal.     Palpations: Abdomen is soft.  Skin:    General: Skin is warm and dry.  Neurological:     Mental Status: She is alert and oriented to person, place, and time. Mental status is at baseline.  Psychiatric:        Mood and Affect: Mood normal.        Behavior: Behavior normal.      Depression Screen PHQ 2/9 Scores 11/12/2019 07/29/2017  PHQ - 2 Score 5 0  PHQ- 9 Score 15 2   Alcohol Use Education Information about Your Drinking Your score on the Alcohol Use Disorders Identification Test was: AUDIT C:  10 TOTAL AUDIT SCORE:  10.  This score places you in the category of:  Score 0 = Abstainers Score 8-19 = Unhealthy/High Risk Drinkers  Score 1-7 = Low Risk Drinkers Score 20+ = Probable Alcohol Dependence   High Scores (20+) on the Alcohol Use Identification Test Consider becoming involved in a structured program.  You should stop drinking if: . You have tried to cut down before but have not been successful, or  . You suffer from morning shakes during a heavy drinking period, or . You have high blood pressure, or . You are pregnant, or . You have liver disease, or . You are taking medicines that react with alcohol, or . Your alcohol use is affecting your social relationships, or . You have legal consequences like DUIs, or . You call in sick to work, or . You cannot take care of our children, or . Someone close to you says you drink too much    How Much Alcohol is a Drink: Beer: 12 oz. = 1 drink 16 oz. = 1.3 drinks 22 oz. = 2 drinks 40 oz. = 3.3 drinks  Wine: 5 oz. = 1 drink 740 mL (25 oz.) bottle = 5 drinks Malt Liquor: 12 oz. = 1.5 drinks 16 oz. = 2 drinks 22 oz. = 2.5 drinks 40 oz. = 4.5 drinks  80-Proof Spirits - Hard Liquor: 1 shot = 1 drink 1 mixed drink = number of shots Can equal 1-3 drinks   What is Low-risk Drinking? . Have no more than 2 drinks of alcohol per day . Drink no more than 5 days per week . Do not drink alcohol drink alcohol  when: - You drive or operate machinery - You are pregnant or breast feeding - You are taking medications that interact with alcohol - You have medical conditions made worse with alcohol - You can stop or control your drinking      Identify Your Triggers for Drinking . Parties . Particular People . Feeling lonely . Feeling tense . Family problems . Feeling sad . Feeling happy . Feeling bored . After work . Problems sleeping . Criticism . Feelings of failure . After being paid . When others are drinking . In bars . When out for dinner . After arguments . Weekends . Feeling restless . Being in pain   Effects of High-Risk Drinking To the Brain: . Aggressive, irrational behavior . Arguments, violence . Depression, nervousness . Alcohol dependence, memory loss To the Nervous System: . Trembling hands, tingling fingers . Numbness, painful nerves . Impaired sensation leading to falls . Numb tingling toes To Your Lifestyle: . Social, legal, medical problems . Domestic trouble/relationship loss . Job loss & financial problems . Shortened life span . Accidents and death from drunk driving   To the Face: . Premature aging, drinker's nose . Cancer of the throat & mouth To the Body: . Frequent cold . Reduced resistance to infection . Increased risk of pneumonia . Weakness of heart muscle . Heart failure, anemia . Impaired blood clotting . Breast cancer . Vitamin deficiency, bleeding . Severe Inflammation of the stomach . Vomiting, diarrhea, malnutrition . Ulcer, inflammation of the pancreas . Impaired sexual performance . Birth defects, including deformities, retardation, and low birthweight   Ways to Cope Without Drinking . Go home if you tend to drink after work . Find another activity . Switch to nonalcoholic beverages . Change friends . Join a club . Volunteer . Visit relatives . Plan/take a trip . Go for a walk .  Take up a hobby . Listen to  music . Talk to a friend . Reading . What would you do if you had no worries about failing?         Good Reasons for Drinking Less . I will live longer - probably 8-10 years. . I will sleep better. . I will be happier. . I will save a lot of money . My relationships will improve. . I will stay younger for longer. . I will achieve more in my life . There will be a greater chance that I will survive to a healthy old age with no premature damage to my brain.  . I will be better at my job. . I will be less likely to feel depressed and commit suicide (6 times less likely). . I will be less likely to die of heart disease or cancer. . Other people will respect me . I will be less likely to get into trouble with the police. . The possibility that I will die of liver disease will be dramatically reduced (12 times less likely). . It will be less likely that I will die in a car accident (3 times less likely).   Strategies for Cutting Down Keep Track.  Find a way to keep track of how much you drink.  If you make a note of each drink before you drink it, this will help slow you down. Count and Measure.  Know the standard drink sizes.  Ask the bartender or server about the amount of alcohol in a mixed drink. Set Goals.  Decide how many days a week you will drink and how many drinks each day. Pace and Space.  When you do drink, pace yourself.  Have no more than one drink with alcohol per hour.  Alternate "drink spacers" non-alcoholic drinks such as water, soda, or juice with drinks containing alcohol. Include Food.  Don't drink on an empty stomach.  Have some food so the alcohol will be absorbed more slowly into your system.  Avoid Triggers.  Avoid people, places, or activities that have led to drinking in the past.  Certain times of day or feelings may also be triggers.  Make a plan so you will know what you can do instead of drinking. Plan to Handle Urges.  When an urge hits, consider these  options:  Remind yourself of your reasons for changing.  Or talk it through with someone you trust. Or get involved with a healthy, distracting activity.  Or, "urge surf" - instead of fighting the feeling, accept it and ride it out, knowing it will soon crest like a wave and pass. Know Your "No".  Have a polite, convincing "no thanks" for those times when you may be offered a drink and don't want one.  The faster you can say no to these offers, the less likely you are to give in.  If you hesitate, it allows you time to think of excuses to go along.      Assessment & Plan:     Routine Health Maintenance and Physical Exam  Exercise Activities and Dietary recommendations Goals   None     Immunization History  Administered Date(s) Administered  . DTaP 10/16/1995, 12/29/1995, 02/24/1996, 01/21/1997, 08/24/1999  . HPV 9-valent 07/30/2017  . Hepatitis A 03/12/2012, 02/23/2013  . Hepatitis B 08/05/1995, 10/16/1995, 02/24/1996  . HiB (PRP-OMP) 10/16/1995, 12/29/1995, 02/24/1996  . IPV 10/16/1995, 12/29/1995, 01/21/1997, 08/24/1999  . Influenza,inj,Quad PF,6+ Mos 07/29/2017, 09/04/2018  . MMR 08/04/1996, 08/24/1999  .  Meningococcal Conjugate 09/22/2006, 02/23/2013  . Tdap 09/15/2006, 05/12/2015, 11/19/2016  . Varicella 08/04/1996, 02/11/2006    Health Maintenance  Topic Date Due  . INFLUENZA VACCINE  11/24/2019 (Originally 03/27/2019)  . PAP-Cervical Cytology Screening  03/20/2021  . PAP SMEAR-Modifier  03/20/2021  . TETANUS/TDAP  11/20/2026  . HIV Screening  Completed     Discussed health benefits of physical activity, and encouraged her to engage in regular exercise appropriate for her age and condition.   1. Annual physical exam  - TSH - Lipid panel - Comprehensive metabolic panel - CBC with Differential/Platelet  2. Depression, unspecified depression type  Declines medication treatment.   - Ambulatory referral to Chronic Care Management Services  3. Alcohol  abuse  Initially patient says she drinks for fun and she could stop at any time. Says many people drink. Then elaborates how she a majority of the time drinks to feel numb and that this is a better coping mechanism than cutting. Have counseled that she has a substance use disorder and maladaptive coping skills. She would likely be served best in a treatment program. She declines medication at this point. Will have her establish with our social worker Chrystal to get information on treatment resources. - Ambulatory referral to Chronic Care Management Services  4. Iron deficiency anemia, unspecified iron deficiency anemia type  - Fe+TIBC+Fer  The entirety of the information documented in the History of Present Illness, Review of Systems and Physical Exam were personally obtained by me. Portions of this information were initially documented by [facility] and reviewed by me for thoroughness and accuracy.    --------------------------------------------------------------------

## 2019-11-12 NOTE — Patient Instructions (Signed)
Health Maintenance, Female Adopting a healthy lifestyle and getting preventive care are important in promoting health and wellness. Ask your health care provider about:  The right schedule for you to have regular tests and exams.  Things you can do on your own to prevent diseases and keep yourself healthy. What should I know about diet, weight, and exercise? Eat a healthy diet   Eat a diet that includes plenty of vegetables, fruits, low-fat dairy products, and lean protein.  Do not eat a lot of foods that are high in solid fats, added sugars, or sodium. Maintain a healthy weight Body mass index (BMI) is used to identify weight problems. It estimates body fat based on height and weight. Your health care provider can help determine your BMI and help you achieve or maintain a healthy weight. Get regular exercise Get regular exercise. This is one of the most important things you can do for your health. Most adults should:  Exercise for at least 150 minutes each week. The exercise should increase your heart rate and make you sweat (moderate-intensity exercise).  Do strengthening exercises at least twice a week. This is in addition to the moderate-intensity exercise.  Spend less time sitting. Even light physical activity can be beneficial. Watch cholesterol and blood lipids Have your blood tested for lipids and cholesterol at 25 years of age, then have this test every 5 years. Have your cholesterol levels checked more often if:  Your lipid or cholesterol levels are high.  You are older than 25 years of age.  You are at high risk for heart disease. What should I know about cancer screening? Depending on your health history and family history, you may need to have cancer screening at various ages. This may include screening for:  Breast cancer.  Cervical cancer.  Colorectal cancer.  Skin cancer.  Lung cancer. What should I know about heart disease, diabetes, and high blood  pressure? Blood pressure and heart disease  High blood pressure causes heart disease and increases the risk of stroke. This is more likely to develop in people who have high blood pressure readings, are of African descent, or are overweight.  Have your blood pressure checked: ? Every 3-5 years if you are 18-39 years of age. ? Every year if you are 40 years old or older. Diabetes Have regular diabetes screenings. This checks your fasting blood sugar level. Have the screening done:  Once every three years after age 40 if you are at a normal weight and have a low risk for diabetes.  More often and at a younger age if you are overweight or have a high risk for diabetes. What should I know about preventing infection? Hepatitis B If you have a higher risk for hepatitis B, you should be screened for this virus. Talk with your health care provider to find out if you are at risk for hepatitis B infection. Hepatitis C Testing is recommended for:  Everyone born from 1945 through 1965.  Anyone with known risk factors for hepatitis C. Sexually transmitted infections (STIs)  Get screened for STIs, including gonorrhea and chlamydia, if: ? You are sexually active and are younger than 24 years of age. ? You are older than 24 years of age and your health care provider tells you that you are at risk for this type of infection. ? Your sexual activity has changed since you were last screened, and you are at increased risk for chlamydia or gonorrhea. Ask your health care provider if   you are at risk.  Ask your health care provider about whether you are at high risk for HIV. Your health care provider may recommend a prescription medicine to help prevent HIV infection. If you choose to take medicine to prevent HIV, you should first get tested for HIV. You should then be tested every 3 months for as long as you are taking the medicine. Pregnancy  If you are about to stop having your period (premenopausal) and  you may become pregnant, seek counseling before you get pregnant.  Take 400 to 800 micrograms (mcg) of folic acid every day if you become pregnant.  Ask for birth control (contraception) if you want to prevent pregnancy. Osteoporosis and menopause Osteoporosis is a disease in which the bones lose minerals and strength with aging. This can result in bone fractures. If you are 65 years old or older, or if you are at risk for osteoporosis and fractures, ask your health care provider if you should:  Be screened for bone loss.  Take a calcium or vitamin D supplement to lower your risk of fractures.  Be given hormone replacement therapy (HRT) to treat symptoms of menopause. Follow these instructions at home: Lifestyle  Do not use any products that contain nicotine or tobacco, such as cigarettes, e-cigarettes, and chewing tobacco. If you need help quitting, ask your health care provider.  Do not use street drugs.  Do not share needles.  Ask your health care provider for help if you need support or information about quitting drugs. Alcohol use  Do not drink alcohol if: ? Your health care provider tells you not to drink. ? You are pregnant, may be pregnant, or are planning to become pregnant.  If you drink alcohol: ? Limit how much you use to 0-1 drink a day. ? Limit intake if you are breastfeeding.  Be aware of how much alcohol is in your drink. In the [place] one drink equals one 12 oz bottle of beer (355 mL), one 5 oz glass of wine (148 mL), or one 1 oz glass of hard liquor (44 mL). General instructions  Schedule regular health, dental, and eye exams.  Stay current with your vaccines.  Tell your health care provider if: ? You often feel depressed. ? You have ever been abused or do not feel safe at home. Summary  Adopting a healthy lifestyle and getting preventive care are important in promoting health and wellness.  Follow your health care provider's instructions about healthy  diet, exercising, and getting tested or screened for diseases.  Follow your health care provider's instructions on monitoring your cholesterol and blood pressure. This information is not intended to replace advice given to you by your health care provider. Make sure you discuss any questions you have with your health care provider. Document Revised: 08/05/2018 Document Reviewed: 08/05/2018 Elsevier Patient Education  2020 Elsevier Inc.  

## 2019-11-13 LAB — COMPREHENSIVE METABOLIC PANEL
ALT: 12 IU/L (ref 0–32)
AST: 19 IU/L (ref 0–40)
Albumin/Globulin Ratio: 1.8 (ref 1.2–2.2)
Albumin: 4.9 g/dL (ref 3.9–5.0)
Alkaline Phosphatase: 97 IU/L (ref 39–117)
BUN/Creatinine Ratio: 9 (ref 9–23)
BUN: 7 mg/dL (ref 6–20)
Bilirubin Total: 0.4 mg/dL (ref 0.0–1.2)
CO2: 22 mmol/L (ref 20–29)
Calcium: 9.9 mg/dL (ref 8.7–10.2)
Chloride: 103 mmol/L (ref 96–106)
Creatinine, Ser: 0.79 mg/dL (ref 0.57–1.00)
GFR calc Af Amer: 121 mL/min/{1.73_m2} (ref >59)
GFR calc non Af Amer: 105 mL/min/{1.73_m2} (ref >59)
Globulin, Total: 2.8 g/dL (ref 1.5–4.5)
Glucose: 77 mg/dL (ref 65–99)
Potassium: 3.8 mmol/L (ref 3.5–5.2)
Sodium: 141 mmol/L (ref 134–144)
Total Protein: 7.7 g/dL (ref 6.0–8.5)

## 2019-11-13 LAB — CBC WITH DIFFERENTIAL/PLATELET
Basophils Absolute: 0 10*3/uL (ref 0.0–0.2)
Basos: 1 %
EOS (ABSOLUTE): 0.2 10*3/uL (ref 0.0–0.4)
Eos: 4 %
Hematocrit: 40.7 % (ref 34.0–46.6)
Hemoglobin: 13.2 g/dL (ref 11.1–15.9)
Immature Grans (Abs): 0 10*3/uL (ref 0.0–0.1)
Immature Granulocytes: 0 %
Lymphocytes Absolute: 1.9 10*3/uL (ref 0.7–3.1)
Lymphs: 37 %
MCH: 27.7 pg (ref 26.6–33.0)
MCHC: 32.4 g/dL (ref 31.5–35.7)
MCV: 86 fL (ref 79–97)
Monocytes Absolute: 0.4 10*3/uL (ref 0.1–0.9)
Monocytes: 8 %
Neutrophils Absolute: 2.6 10*3/uL (ref 1.4–7.0)
Neutrophils: 50 %
Platelets: 206 10*3/uL (ref 150–450)
RBC: 4.76 x10E6/uL (ref 3.77–5.28)
RDW: 12.3 % (ref 11.7–15.4)
WBC: 5.2 10*3/uL (ref 3.4–10.8)

## 2019-11-13 LAB — IRON,TIBC AND FERRITIN PANEL
Ferritin: 66 ng/mL (ref 15–150)
Iron Saturation: 24 % (ref 15–55)
Iron: 83 ug/dL (ref 27–159)
Total Iron Binding Capacity: 351 ug/dL (ref 250–450)
UIBC: 268 ug/dL (ref 131–425)

## 2019-11-13 LAB — LIPID PANEL
Chol/HDL Ratio: 2 ratio (ref 0.0–4.4)
Cholesterol, Total: 123 mg/dL (ref 100–199)
HDL: 61 mg/dL (ref >39)
LDL Chol Calc (NIH): 49 mg/dL (ref 0–99)
Triglycerides: 62 mg/dL (ref 0–149)
VLDL Cholesterol Cal: 13 mg/dL (ref 5–40)

## 2019-11-13 LAB — TSH: TSH: 0.809 u[IU]/mL (ref 0.450–4.500)

## 2019-11-15 ENCOUNTER — Ambulatory Visit: Payer: Self-pay | Admitting: *Deleted

## 2019-11-15 NOTE — Chronic Care Management (AMB) (Signed)
   Care Management   Unsuccessful Call Note 11/15/2019 Name: [name] MRN: [id number] DOB: 05/09/1996  Patient is a 24 year old female who sees Osvaldo Angst, [place] for primary care. Osvaldo Angst, PA-C asked the CCM team to consult the patient for Mental Health Counseling and Resources.  Referral was placed on 11/12/19. Patient's last office visit was 11/12/19.     This social worker was unable to reach patient via telephone today for initial call. I have left HIPAA compliant voicemail asking patient to return my call. (unsuccessful outreach #1).   Plan: Will follow-up within 7 business days via telephone.      Verna Czech, LCSW Clinical Social Worker  [facility] Care Management [phone]

## 2019-11-16 ENCOUNTER — Telehealth: Payer: Self-pay

## 2019-11-16 NOTE — Telephone Encounter (Signed)
LMTCB 11/16/2019.  PEC please advise pt of lab results below.    Thanks,   -Vernona Rieger

## 2019-11-16 NOTE — Telephone Encounter (Signed)
-----   Message from Jennifer M Burnette, PA-C sent at 11/15/2019 12:21 PM EDT ----- Blood count is normal. Iron levels are normal. Kidney and liver function are normal. Sodium, potassium and calcium are normal. Sugar is normal. Cholesterol is normal. Thyroid is normal.  

## 2019-11-16 NOTE — Telephone Encounter (Signed)
Reviewed lab results and provider's note with the patient. No further questions at this time.

## 2019-11-16 NOTE — Telephone Encounter (Deleted)
-----   Message from Margaretann Loveless, [place] sent at 11/15/2019 12:21 PM EDT ----- Blood count is normal. Iron levels are normal. Kidney and liver function are normal. Sodium, potassium and calcium are normal. Sugar is normal. Cholesterol is normal. Thyroid is normal.

## 2019-11-23 NOTE — Telephone Encounter (Signed)
error 

## 2019-11-29 ENCOUNTER — Emergency Department (HOSPITAL_COMMUNITY)
Admission: EM | Admit: 2019-11-29 | Discharge: 2019-11-30 | Disposition: A | Discharge status: Other Acute Inpatient Hospital | Payer: 59 | Attending: Emergency Medicine | Admitting: Emergency Medicine

## 2019-11-29 ENCOUNTER — Other Ambulatory Visit: Payer: Self-pay

## 2019-11-29 ENCOUNTER — Encounter (HOSPITAL_COMMUNITY): Payer: Self-pay

## 2019-11-29 DIAGNOSIS — F332 Major depressive disorder, recurrent severe without psychotic features: Secondary | ICD-10-CM | POA: Diagnosis not present

## 2019-11-29 DIAGNOSIS — F99 Mental disorder, not otherwise specified: Secondary | ICD-10-CM | POA: Diagnosis present

## 2019-11-29 DIAGNOSIS — T1491XA Suicide attempt, initial encounter: Secondary | ICD-10-CM | POA: Insufficient documentation

## 2019-11-29 DIAGNOSIS — T71162A Asphyxiation due to hanging, intentional self-harm, initial encounter: Secondary | ICD-10-CM

## 2019-11-29 DIAGNOSIS — Y92002 Bathroom of unspecified non-institutional (private) residence single-family (private) house as the place of occurrence of the external cause: Secondary | ICD-10-CM | POA: Insufficient documentation

## 2019-11-29 DIAGNOSIS — Y999 Unspecified external cause status: Secondary | ICD-10-CM | POA: Diagnosis not present

## 2019-11-29 DIAGNOSIS — Z20822 Contact with and (suspected) exposure to covid-19: Secondary | ICD-10-CM | POA: Insufficient documentation

## 2019-11-29 DIAGNOSIS — Z91010 Allergy to peanuts: Secondary | ICD-10-CM | POA: Diagnosis not present

## 2019-11-29 DIAGNOSIS — D573 Sickle-cell trait: Secondary | ICD-10-CM | POA: Insufficient documentation

## 2019-11-29 DIAGNOSIS — Y939 Activity, unspecified: Secondary | ICD-10-CM | POA: Diagnosis not present

## 2019-11-29 DIAGNOSIS — X838XXA Intentional self-harm by other specified means, initial encounter: Secondary | ICD-10-CM | POA: Diagnosis not present

## 2019-11-29 LAB — CBC WITH DIFFERENTIAL/PLATELET
Abs Immature Granulocytes: 0.01 10*3/uL (ref 0.00–0.07)
Basophils Absolute: 0 10*3/uL (ref 0.0–0.1)
Basophils Relative: 1 %
Eosinophils Absolute: 0.2 10*3/uL (ref 0.0–0.5)
Eosinophils Relative: 4 %
HCT: 38.5 % (ref 36.0–46.0)
Hemoglobin: 12.7 g/dL (ref 12.0–15.0)
Immature Granulocytes: 0 %
Lymphocytes Relative: 29 %
Lymphs Abs: 1.7 10*3/uL (ref 0.7–4.0)
MCH: 28.1 pg (ref 26.0–34.0)
MCHC: 33 g/dL (ref 30.0–36.0)
MCV: 85.2 fL (ref 80.0–100.0)
Monocytes Absolute: 0.5 10*3/uL (ref 0.1–1.0)
Monocytes Relative: 8 %
Neutro Abs: 3.5 10*3/uL (ref 1.7–7.7)
Neutrophils Relative %: 58 %
Platelets: 194 10*3/uL (ref 150–400)
RBC: 4.52 MIL/uL (ref 3.87–5.11)
RDW: 12.3 % (ref 11.5–15.5)
WBC: 5.9 10*3/uL (ref 4.0–10.5)
nRBC: 0 % (ref 0.0–0.2)

## 2019-11-29 LAB — RESPIRATORY PANEL BY RT PCR (FLU A&B, COVID)
Influenza A by PCR: NEGATIVE
Influenza B by PCR: NEGATIVE
SARS Coronavirus 2 by RT PCR: NEGATIVE

## 2019-11-29 LAB — COMPREHENSIVE METABOLIC PANEL
ALT: 13 U/L (ref 0–44)
AST: 18 U/L (ref 15–41)
Albumin: 4.1 g/dL (ref 3.5–5.0)
Alkaline Phosphatase: 70 U/L (ref 38–126)
Anion gap: 8 (ref 5–15)
BUN: <5 mg/dL — ABNORMAL LOW (ref 6–20)
CO2: 24 mmol/L (ref 22–32)
Calcium: 9.1 mg/dL (ref 8.9–10.3)
Chloride: 109 mmol/L (ref 98–111)
Creatinine, Ser: 0.65 mg/dL (ref 0.44–1.00)
GFR calc Af Amer: >60 mL/min (ref >60)
GFR calc non Af Amer: >60 mL/min (ref >60)
Glucose, Bld: 99 mg/dL (ref 70–99)
Potassium: 3.7 mmol/L (ref 3.5–5.1)
Sodium: 141 mmol/L (ref 135–145)
Total Bilirubin: 0.6 mg/dL (ref 0.3–1.2)
Total Protein: 7.2 g/dL (ref 6.5–8.1)

## 2019-11-29 LAB — ETHANOL: Alcohol, Ethyl (B): 21 mg/dL — ABNORMAL HIGH (ref <10)

## 2019-11-29 LAB — I-STAT BETA HCG BLOOD, ED (MC, WL, AP ONLY): I-stat hCG, quantitative: <5 m[IU]/mL (ref <5)

## 2019-11-29 MED ORDER — IBUPROFEN 200 MG PO TABS
600.0000 mg | ORAL_TABLET | Freq: Once | ORAL | Status: AC
  Administered 2019-11-30: 600 mg via ORAL
  Filled 2019-11-29: qty 3

## 2019-11-29 MED ORDER — IBUPROFEN 200 MG PO TABS: 600.0000 mg | ORAL_TABLET | Freq: Three times a day (TID) | ORAL | Status: DC | PRN

## 2019-11-29 MED ORDER — ALUM & MAG HYDROXIDE-SIMETH 200-200-20 MG/5ML PO SUSP: 30.0000 mL | Freq: Four times a day (QID) | ORAL | Status: DC | PRN

## 2019-11-29 MED ORDER — ONDANSETRON HCL 4 MG PO TABS: 4.0000 mg | ORAL_TABLET | Freq: Three times a day (TID) | ORAL | Status: DC | PRN

## 2019-11-29 NOTE — ED Notes (Signed)
ED Provider at bedside. 

## 2019-11-29 NOTE — ED Notes (Signed)
Two gold, one blue tube saved in main lab

## 2019-11-29 NOTE — ED Provider Notes (Signed)
[facility] DEPT Provider Note   CSN: [id number] Arrival date & time: 11/29/19  2025     History Chief Complaint  Patient presents with  . SI Attempt  . Alcohol Intoxication    [name] is a 24 y.o. female.  She is brought in by [employer] and EMS after being found her boyfriend's house unresponsive in the bathroom having had attempted to hang herself with a towel.  She was reportedly unresponsive and he performed CPR on her.  Patient was awake and alert when EMS got there and when she was ambulating with them she eloped and they had to chase her down to catch her.  Currently she is complaining of some pain to the back of her left calf.  She thinks she pulled a muscle.  She has been drinking wine and vodka today.  History of depression and complaining of being depressed.  The history is provided by the patient.  Mental Health Problem Presenting symptoms: depression and suicide attempt   Patient accompanied by:  Law enforcement Degree of incapacity (severity):  Severe Onset quality:  Gradual Timing:  Unable to specify Progression:  Unchanged Context: alcohol use and stressful life event   Relieved by:  Nothing Worsened by:  Alcohol Ineffective treatments:  None tried Associated symptoms: no abdominal pain, no chest pain and no headaches   Risk factors: hx of mental illness        Past Medical History:  Diagnosis Date  . Depression   . Heart murmur   . Sickle cell trait [facility])     Patient Active Problem List   Diagnosis Date Noted  . Encounter for planned induction of labor 11/06/2018  . Anemia affecting pregnancy, antepartum 10/02/2018  . Circumvallate placenta, unspecified trimester 09/04/2018  . Depression 04/17/2018  . Supervision of other normal pregnancy, antepartum 03/25/2018    Past Surgical History:  Procedure Laterality Date  . NO PAST SURGERIES       OB History    Gravida  2   Para  2   Term  2   Preterm      AB       Living  2     SAB      TAB      Ectopic      Multiple  0   Live Births  2           Family History  Problem Relation Age of Onset  . Hypertension Maternal Grandmother   . Healthy Mother   . Healthy Father     Social History   Tobacco Use  . Smoking status: Never Smoker  . Smokeless tobacco: Never Used  Substance Use Topics  . Alcohol use: Not Currently  . Drug use: No    Home Medications Prior to Admission medications   Medication Sig Start Date End Date Taking? Authorizing Provider  ferrous sulfate 325 (65 FE) MG tablet Take 325 mg by mouth daily with breakfast.    [provider]  ibuprofen (ADVIL,MOTRIN) 600 MG tablet Take 1 tablet (600 mg total) by mouth every 6 (six) hours. Patient not taking: Reported on 03/12/2019 11/07/18   Malachy Mood, MD  levonorgestrel [facility]) 20 MCG/24HR IUD 1 each by Intrauterine route once.    [provider]  Misc. Devices (BREAST PUMP) MISC Dispense one breast pump for patient Patient not taking: Reported on 11/12/2019 08/21/18   Malachy Mood, MD  Prenatal Vit-Fe Fumarate-FA (MULTIVITAMIN-PRENATAL) 27-0.8 MG TABS tablet Take 1  tablet by mouth daily at 12 noon.    [provider]    Allergies    Peanuts [peanut oil] and Mushroom extract complex  Review of Systems   Review of Systems  Constitutional: Negative for fever.  HENT: Negative for sore throat.   Eyes: Negative for visual disturbance.  Respiratory: Negative for shortness of breath.   Cardiovascular: Negative for chest pain.  Gastrointestinal: Negative for abdominal pain.  Genitourinary: Negative for dysuria.  Musculoskeletal: Negative for neck pain.  Skin: Negative for rash.  Neurological: Negative for headaches.    Physical Exam Updated Vital Signs BP 123/69 (BP Location: Right Arm)   Pulse 97   Temp 98.9 F (37.2 C) (Oral)   Resp 16   Ht 5\' 10"  (1.778 m)   Wt 63.5 kg   SpO2 99%   BMI 20.09 kg/m   Physical  Exam Vitals and nursing note reviewed.  Constitutional:      General: She is not in acute distress.    Appearance: She is well-developed.  HENT:     Head: Normocephalic and atraumatic.  Eyes:     Conjunctiva/sclera: Conjunctivae normal.  Neck:     Vascular: No carotid bruit.     Comments: No marks on her neck no bruits Cardiovascular:     Rate and Rhythm: Normal rate and regular rhythm.     Heart sounds: No murmur.  Pulmonary:     Effort: Pulmonary effort is normal. No respiratory distress.     Breath sounds: Normal breath sounds.  Abdominal:     Palpations: Abdomen is soft.     Tenderness: There is no abdominal tenderness.  Musculoskeletal:        General: Tenderness (Left mid calf.  Achilles intact.) present. Normal range of motion.     Cervical back: Normal range of motion and neck supple. No rigidity or tenderness.  Skin:    General: Skin is warm and dry.     Capillary Refill: Capillary refill takes less than 2 seconds.  Neurological:     General: No focal deficit present.     Mental Status: She is alert and oriented to person, place, and time.     Cranial Nerves: No cranial nerve deficit.     Sensory: No sensory deficit.     Motor: No weakness.     ED Results / Procedures / Treatments   Labs (all labs ordered are listed, but only abnormal results are displayed) Labs Reviewed  COMPREHENSIVE METABOLIC PANEL - Abnormal; Notable for the following components:      Result Value   BUN <5 (*)    All other components within normal limits  ETHANOL - Abnormal; Notable for the following components:   Alcohol, Ethyl (B) 21 (*)    All other components within normal limits  RESPIRATORY PANEL BY RT PCR (FLU A&B, COVID)  CBC WITH DIFFERENTIAL/PLATELET  RAPID URINE DRUG SCREEN, HOSP PERFORMED  I-STAT BETA HCG BLOOD, ED ([facility], [facility], [facility] ONLY)    EKG None  Radiology No results found.  Procedures Procedures (including critical care time)  Medications Ordered in  ED Medications - No data to display  ED Course  I have reviewed the triage vital signs and the nursing notes.  Pertinent labs & imaging results that were available during my care of the patient were reviewed by me and considered in my medical decision making (see chart for details).  Clinical Course as of Nov 30 1047  Mon Nov 29, 2019  2059 Patient  currently is voluntary although she already eloped from EMS please once.  I am going to fill out an IVC and not file it but if she attempts to elope I do believe she qualifies for commitment.   [MB]  Tue Nov 30, 2019  1046 CO2: 24 [MB]    Clinical Course User Index [MB] Terrilee Files, MD   MDM Rules/Calculators/A&P                     This patient complains of suicide attempt with attempted hanging.  Also has calf pain. ; this involves an extensive number of treatment Options and is a complaint that carries with it a high risk of complications and Morbidity. The differential includes neck injury including vascular disruption.  Also consider intoxication, ingestion, metabolic derangement  I ordered, reviewed and interpreted labs, which included normal hemoglobin normal chemistry and LFTs including normal gap, pregnancy test negative, alcohol mildly elevated 21, Covid testing negative I ordered medication ibuprofen for calf pain  Additional history obtained from police were at scene  After the interventions stated above, I reevaluated the patient and found her to be medically appropriate for psychiatric evaluation.  Currently she is not under an IVC but I filled out an IVC and left with the nurse if the patient does not wish to remain voluntary and attempts to leave.   Final Clinical Impression(s) / ED Diagnoses Final diagnoses:  Suicide attempt by hanging, initial encounter [facility])    Rx / DC Orders ED Discharge Orders    None       Terrilee Files, MD 11/30/19 1049

## 2019-11-29 NOTE — ED Notes (Signed)
[name] [phone]- patients mother, would like update when possible. Per patient, staff has permission to give patients mother information and updates about patient.
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
## 2019-11-30 ENCOUNTER — Inpatient Hospital Stay (HOSPITAL_COMMUNITY)
Admit: 2019-11-30 | Discharge: 2019-11-30 | Disposition: A | Discharge status: Home / Self Care | Payer: PRIVATE HEALTH INSURANCE | Attending: Psychiatric/Mental Health | Admitting: Psychiatric/Mental Health

## 2019-11-30 ENCOUNTER — Encounter (HOSPITAL_COMMUNITY): Payer: Self-pay | Admitting: Psychiatry

## 2019-11-30 ENCOUNTER — Inpatient Hospital Stay (HOSPITAL_COMMUNITY)
Admission: AD | Admit: 2019-11-30 | Discharge: 2019-12-03 | DRG: 885 | Disposition: A | Discharge status: Home / Self Care | Payer: PRIVATE HEALTH INSURANCE | Source: Intra-hospital | Attending: Psychiatry | Admitting: Psychiatry

## 2019-11-30 DIAGNOSIS — Z791 Long term (current) use of non-steroidal anti-inflammatories (NSAID): Secondary | ICD-10-CM | POA: Diagnosis not present

## 2019-11-30 DIAGNOSIS — D573 Sickle-cell trait: Secondary | ICD-10-CM | POA: Diagnosis present

## 2019-11-30 DIAGNOSIS — G4700 Insomnia, unspecified: Secondary | ICD-10-CM | POA: Diagnosis present

## 2019-11-30 DIAGNOSIS — Z8249 Family history of ischemic heart disease and other diseases of the circulatory system: Secondary | ICD-10-CM

## 2019-11-30 DIAGNOSIS — F1010 Alcohol abuse, uncomplicated: Secondary | ICD-10-CM | POA: Diagnosis present

## 2019-11-30 DIAGNOSIS — Z91018 Allergy to other foods: Secondary | ICD-10-CM

## 2019-11-30 DIAGNOSIS — F332 Major depressive disorder, recurrent severe without psychotic features: Secondary | ICD-10-CM | POA: Diagnosis present

## 2019-11-30 DIAGNOSIS — T1491XA Suicide attempt, initial encounter: Secondary | ICD-10-CM

## 2019-11-30 DIAGNOSIS — Z91010 Allergy to peanuts: Secondary | ICD-10-CM | POA: Diagnosis not present

## 2019-11-30 DIAGNOSIS — Z915 Personal history of self-harm: Secondary | ICD-10-CM

## 2019-11-30 DIAGNOSIS — Y901 Blood alcohol level of 20-39 mg/100 ml: Secondary | ICD-10-CM | POA: Diagnosis present

## 2019-11-30 DIAGNOSIS — Z975 Presence of (intrauterine) contraceptive device: Secondary | ICD-10-CM | POA: Diagnosis not present

## 2019-11-30 DIAGNOSIS — Z79899 Other long term (current) drug therapy: Secondary | ICD-10-CM | POA: Diagnosis not present

## 2019-11-30 DIAGNOSIS — F419 Anxiety disorder, unspecified: Secondary | ICD-10-CM | POA: Diagnosis present

## 2019-11-30 HISTORY — DX: Anxiety disorder, unspecified: F41.9

## 2019-11-30 MED ORDER — ONE DAILY MULTIVITAMIN ADULT PO TABS: 1.0000 | ORAL_TABLET | Freq: Every day | ORAL | Status: DC

## 2019-11-30 MED ORDER — FOLIC ACID 1 MG PO TABS
1.0000 mg | ORAL_TABLET | Freq: Every day | ORAL | Status: DC
  Administered 2019-11-30 – 2019-12-03 (×4): 1 mg via ORAL
  Filled 2019-11-30 (×5): qty 1

## 2019-11-30 MED ORDER — ACETAMINOPHEN 325 MG PO TABS: 650.0000 mg | ORAL_TABLET | Freq: Four times a day (QID) | ORAL | Status: DC | PRN

## 2019-11-30 MED ORDER — ALUM & MAG HYDROXIDE-SIMETH 200-200-20 MG/5ML PO SUSP: 30.0000 mL | ORAL | Status: DC | PRN

## 2019-11-30 MED ORDER — SERTRALINE HCL 25 MG PO TABS
25.0000 mg | ORAL_TABLET | Freq: Every day | ORAL | Status: DC
  Administered 2019-11-30 – 2019-12-01 (×2): 25 mg via ORAL
  Filled 2019-11-30 (×3): qty 1

## 2019-11-30 MED ORDER — HYDROXYZINE HCL 25 MG PO TABS: 25.0000 mg | ORAL_TABLET | Freq: Four times a day (QID) | ORAL | Status: DC | PRN

## 2019-11-30 MED ORDER — LORAZEPAM 1 MG PO TABS: 1.0000 mg | ORAL_TABLET | Freq: Four times a day (QID) | ORAL | Status: AC | PRN

## 2019-11-30 MED ORDER — ADULT MULTIVITAMIN W/MINERALS CH
1.0000 | ORAL_TABLET | Freq: Every day | ORAL | Status: DC
  Administered 2019-11-30 – 2019-12-03 (×4): 1 via ORAL
  Filled 2019-11-30 (×5): qty 1

## 2019-11-30 MED ORDER — ONDANSETRON 4 MG PO TBDP
4.0000 mg | ORAL_TABLET | Freq: Four times a day (QID) | ORAL | Status: AC | PRN
  Administered 2019-11-30: 4 mg via ORAL
  Filled 2019-11-30: qty 1

## 2019-11-30 MED ORDER — THIAMINE HCL 100 MG PO TABS
100.0000 mg | ORAL_TABLET | Freq: Every day | ORAL | Status: DC
  Administered 2019-12-01 – 2019-12-03 (×3): 100 mg via ORAL
  Filled 2019-11-30 (×4): qty 1

## 2019-11-30 MED ORDER — HYDROXYZINE HCL 25 MG PO TABS
25.0000 mg | ORAL_TABLET | Freq: Three times a day (TID) | ORAL | Status: DC | PRN
  Administered 2019-11-30: 25 mg via ORAL
  Filled 2019-11-30: qty 1
  Filled 2019-11-30: qty 10
  Filled 2019-11-30: qty 1

## 2019-11-30 MED ORDER — THIAMINE HCL 100 MG/ML IJ SOLN
100.0000 mg | Freq: Once | INTRAMUSCULAR | Status: AC
  Administered 2019-11-30: 100 mg via INTRAMUSCULAR
  Filled 2019-11-30: qty 2

## 2019-11-30 MED ORDER — LOPERAMIDE HCL 2 MG PO CAPS: 2.0000 mg | ORAL_CAPSULE | ORAL | Status: AC | PRN

## 2019-11-30 MED ORDER — TRAZODONE HCL 50 MG PO TABS
50.0000 mg | ORAL_TABLET | Freq: Every evening | ORAL | Status: DC | PRN
  Filled 2019-11-30: qty 1
  Filled 2019-11-30: qty 7

## 2019-11-30 MED ORDER — MAGNESIUM HYDROXIDE 400 MG/5ML PO SUSP: 30.0000 mL | Freq: Every day | ORAL | Status: DC | PRN

## 2019-11-30 NOTE — Progress Notes (Signed)
   11/30/2019 at 1400 Close Observation:  Patient has returned from [facility] and is sleeping in her bed.  Respirations even and unlabored.  No signs/symptoms of pain/distress noted on patient's face/body movements.  Safety maintained with close observation.
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
## 2019-11-30 NOTE — ED Notes (Signed)
Patient has one overnight bag and two white personal belongings bag. Patient has been seen and wand by security.

## 2019-11-30 NOTE — Progress Notes (Signed)
   11/30/19 0933  Psych Admission Type (Psych Patients Only)  Admission Status Involuntary  Psychosocial Assessment  Patient Complaints Anxiety;Appetite decrease;Depression;Helplessness;Panic attack;Sadness;Self-harm behaviors;Worrying  Eye Contact Fair  Facial Expression Sad;Worried  Affect Depressed;Sad  Speech Logical/coherent  Interaction Assertive  Motor Activity  (WDL)  Appearance/Hygiene In scrubs  Behavior Characteristics Cooperative;Appropriate to situation  Mood Depressed;Sad  Thought Process  Coherency WDL  Content WDL  Delusions None reported or observed  Perception WDL  Hallucination None reported or observed  Judgment Poor  Confusion WDL  Danger to Self  Current suicidal ideation? Denies  Danger to Others  Danger to Others None reported or observed

## 2019-11-30 NOTE — Progress Notes (Signed)
Close Observation 11/30/2019 at 1745  Patient's mother called about her daughter.  Mom would like SW to call her tomorrow phone [phone].  Mom wants to know discharge date.  Mom is taking care of two children.   Patient has been sleeping this afternoon.  Mom did not want her daughter woke up.

## 2019-11-30 NOTE — Discharge Summary (Signed)
  Patient to be transferred to [facility] for inpatient psychiatric treatment 

## 2019-11-30 NOTE — Progress Notes (Addendum)
Patient's first admission to [facility] involuntary.  Patient cooperative and pleasant.  Lives with mother .  Stressors, must take care of her children, difficult to talk about her problems.  Was at her boyfriend's house." It was a rough day, things piled up.  Drinking as coping mechanisms.  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  Has two sons which are 1 yr old and 2 and half yr old.  Very guarded.  Skin assessment:  Tattoos on upper arms, legs, stomach.  Superficial scratches on L hand.  Cuts on L wrist.  Patient stated "I cut everywhere."

## 2019-11-30 NOTE — Progress Notes (Signed)
   11/30/19 0933  COVID-19 Daily Checkoff  Have you had a fever (temp > 37.80C/100F)  in the past 24 hours?  No  If you have had runny nose, nasal congestion, sneezing in the past 24 hours, has it worsened? No  COVID-19 EXPOSURE  Have you traveled outside the state in the past 14 days? No  Have you been in contact with someone with a confirmed diagnosis of COVID-19 or PUI in the past 14 days without wearing appropriate PPE? No  Have you been living in the same home as a person with confirmed diagnosis of COVID-19 or a PUI (household contact)? No  Have you been diagnosed with COVID-19? No

## 2019-11-30 NOTE — [facility] Group Notes (Signed)
Pt did not attend wrap up group this evening. Pt was at the med room speaking with their nurse.

## 2019-11-30 NOTE — ED Notes (Signed)
Enters my care. Awake, alert, calm and cooperative. Pt advised she is IVC'd and will be transferred to [facility]. Pt becomes agitated and attempts to leave. Sts "no one told me I was IVC'd, I'm leaving". Pt leaves room and walks into hall to leave. Security notified. EDP notified. Pt redirected into room. MD at bedside to explain plan to pt who remains calm and cooperative at this time. Call placed to Will RN at [facility] to give report. Report accepted. Call placed to [employer] for transport. Pt eating vegetarian breakfast. Phone given to call her Mother and speak with her child. Cont to monitor.
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
## 2019-11-30 NOTE — Progress Notes (Signed)
Close observation initiated for patient's safety.  Patient could not contract for safety while talking to NP after admission.  Respirations even and unlabored.  No signs/symptoms of pain/distress noted on patient's face/body movements.

## 2019-11-30 NOTE — ED Notes (Addendum)
Officer Botte arrives to transport pt to [facility]. 1 large pink, black and gray duffle bag and 2 hospital belonging bags given to officer to accompany pt.

## 2019-11-30 NOTE — Progress Notes (Signed)
   11/30/19 2200  Psych Admission Type (Psych Patients Only)  Admission Status Involuntary  Psychosocial Assessment  Patient Complaints Anxiety  Eye Contact Fair  Facial Expression Sad;Worried  Affect Depressed;Sad  Speech Logical/coherent  Interaction Assertive  Motor Activity  (WDL)  Appearance/Hygiene In scrubs  Behavior Characteristics Cooperative  Mood Depressed  Thought Process  Coherency WDL  Content WDL  Delusions None reported or observed  Perception WDL  Hallucination None reported or observed  Judgment Poor  Confusion WDL  Danger to Self  Current suicidal ideation? Denies  Danger to Others  Danger to Others None reported or observed

## 2019-11-30 NOTE — ED Notes (Signed)
Pt ambulated out of ED with Officer. Remains calm and cooperative. NAD. No complaints voiced.
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
## 2019-11-30 NOTE — Tx Team (Signed)
Initial Treatment Plan 11/30/2019 5:33 PM [name] ZOX:[id number]    PATIENT STRESSORS: Educational concerns Health problems Marital or family conflict Traumatic event   PATIENT STRENGTHS: Communication skills Motivation for treatment/growth Supportive family/friends   PATIENT IDENTIFIED PROBLEMS: depression  anxiety  Suicidal ideation                 DISCHARGE CRITERIA:  Improved stabilization in mood, thinking, and/or behavior Reduction of life-threatening or endangering symptoms to within safe limits Verbal commitment to aftercare and medication compliance  PRELIMINARY DISCHARGE PLAN: Attend PHP/IOP Return to previous living arrangement Return to previous work or school arrangements  PATIENT/FAMILY INVOLVEMENT: This treatment plan has been presented to and reviewed with the patient, [name]. The patient has been given the opportunity to ask questions and make suggestions.  Ephraim Hamburger, RN 11/30/2019, 5:33 PM

## 2019-12-01 DIAGNOSIS — F332 Major depressive disorder, recurrent severe without psychotic features: Principal | ICD-10-CM

## 2019-12-01 LAB — LIPID PANEL
Cholesterol: 117 mg/dL (ref 0–200)
HDL: 55 mg/dL (ref >40)
LDL Cholesterol: 53 mg/dL (ref 0–99)
Total CHOL/HDL Ratio: 2.1 RATIO
Triglycerides: 45 mg/dL (ref <150)
VLDL: 9 mg/dL (ref 0–40)

## 2019-12-01 MED ORDER — ENSURE ENLIVE PO LIQD
237.0000 mL | Freq: Two times a day (BID) | ORAL | Status: DC
  Administered 2019-12-01 – 2019-12-02 (×3): 237 mL via ORAL

## 2019-12-01 MED ORDER — SERTRALINE HCL 50 MG PO TABS
50.0000 mg | ORAL_TABLET | Freq: Every day | ORAL | Status: DC
  Administered 2019-12-02 – 2019-12-03 (×2): 50 mg via ORAL
  Filled 2019-12-01 (×3): qty 1

## 2019-12-01 MED ORDER — SERTRALINE HCL 25 MG PO TABS
25.0000 mg | ORAL_TABLET | Freq: Once | ORAL | Status: AC
  Administered 2019-12-01: 25 mg via ORAL
  Filled 2019-12-01: qty 1

## 2019-12-01 NOTE — Progress Notes (Addendum)
Close observation note D:Pt observed sleeping in bed with eyes closed. RR even and unlabored. No distress noted. A: close observation continues for safety  R: pt remains safe  

## 2019-12-01 NOTE — Progress Notes (Signed)
Recreation Therapy Notes  Date:  4.7.21 Time: 0930 Location: 300 Hall Group Room  Group Topic: Stress Management  Goal Area(s) Addresses:  Patient will identify positive stress management techniques. Patient will identify benefits of using stress management post d/c.  Intervention: Stress Management  Activity :  Meditation.  LRT played a meditation that focused on making the most of each moment of the day.  Patients were to listen and follow along as meditation played.  Education:  Stress Management, Discharge Planning.   Education Outcome: Acknowledges Education  Clinical Observations/Feedback: Pt did not attend group activity.    Lathan Gieselman, LRT/CTRS         Doneta Bayman A 12/01/2019 11:03 AM 

## 2019-12-01 NOTE — Tx Team (Signed)
Interdisciplinary Treatment and Diagnostic Plan Update  12/01/2019 Time of Session:  [name] MRN: [id number]  Principal Diagnosis: Severe recurrent major depression without psychotic features [facility])  Secondary Diagnoses: Principal Problem:   Severe recurrent major depression without psychotic features ([name])   Current Medications:  Current Facility-Administered Medications  Medication Dose Route Frequency Provider Last Rate Last Admin  . acetaminophen (TYLENOL) tablet 650 mg  650 mg Oral Q6H PRN Gabriella Romp A, NP      . alum & mag hydroxide-simeth (MAALOX/MYLANTA) 200-200-20 MG/5ML suspension 30 mL  30 mL Oral Q4H PRN Gabriella Romp A, NP      . folic acid (FOLVITE) tablet 1 mg  1 mg Oral Daily Gabriella Burkitt, NP   1 mg at 12/01/19 0905  . hydrOXYzine (ATARAX/VISTARIL) tablet 25 mg  25 mg Oral TID PRN Gabriella Romp A, NP   25 mg at 11/30/19 2328  . loperamide (IMODIUM) capsule 2-4 mg  2-4 mg Oral PRN Gabriella Burkitt, NP      . LORazepam (ATIVAN) tablet 1 mg  1 mg Oral Q6H PRN Gabriella Burkitt, NP      . magnesium hydroxide (MILK OF MAGNESIA) suspension 30 mL  30 mL Oral Daily PRN Gabriella Romp A, NP      . multivitamin with minerals tablet 1 tablet  1 tablet Oral Daily Gabriella Burkitt, NP   1 tablet at 12/01/19 0904  . ondansetron (ZOFRAN-ODT) disintegrating tablet 4 mg  4 mg Oral Q6H PRN Gabriella Burkitt, NP   4 mg at 11/30/19 2327  . sertraline (ZOLOFT) tablet 25 mg  25 mg Oral Once Gabriella Nunnery, NP      . Derrill Memo ON 12/02/2019] sertraline (ZOLOFT) tablet 50 mg  50 mg Oral Daily Gabriella Romp A, NP      . thiamine tablet 100 mg  100 mg Oral Daily Gabriella Burkitt, NP   100 mg at 12/01/19 0905  . traZODone (DESYREL) tablet 50 mg  50 mg Oral QHS PRN Gabriella Nunnery, NP       PTA Medications: Medications Prior to Admission  Medication Sig Dispense Refill Last Dose  . BIOTIN PO Take 1 capsule by mouth daily.     [name] ibuprofen (ADVIL,MOTRIN) 600 MG tablet Take 1 tablet (600 mg total) by mouth  every 6 (six) hours. (Patient not taking: Reported on 03/12/2019) 30 tablet 0   . levonorgestrel (MIRENA) 20 MCG/24HR IUD 1 each by Intrauterine route once.     . Misc. Devices (BREAST PUMP) MISC Dispense one breast pump for patient (Patient not taking: Reported on 11/12/2019) 1 each 0   . Multiple Vitamins-Minerals (ONE DAILY MULTIVITAMIN ADULT) TABS Take 1 tablet by mouth daily.       Patient Stressors: Educational concerns Health problems Marital or family conflict Traumatic event  Patient Strengths: Agricultural engineer for treatment/growth Supportive family/friends  Treatment Modalities: Medication Management, Group therapy, Case management,  1 to 1 session with clinician, Psychoeducation, Recreational therapy.   Physician Treatment Plan for Primary Diagnosis: Severe recurrent major depression without psychotic features ([place]) Long Term Goal(s): Improvement in symptoms so as ready for discharge Improvement in symptoms so as ready for discharge   Short Term Goals: Ability to identify changes in lifestyle to reduce recurrence of condition will improve Ability to verbalize feelings will improve Ability to disclose and discuss suicidal ideas Ability to demonstrate self-control will improve Ability to identify and develop effective coping behaviors will improve  Medication Management: Evaluate  patient's response, side effects, and tolerance of medication regimen.  Therapeutic Interventions: 1 to 1 sessions, Unit Group sessions and Medication administration.  Evaluation of Outcomes: Not Met  Physician Treatment Plan for Secondary Diagnosis: Principal Problem:   Severe recurrent major depression without psychotic features ([place])  Long Term Goal(s): Improvement in symptoms so as ready for discharge Improvement in symptoms so as ready for discharge   Short Term Goals: Ability to identify changes in lifestyle to reduce recurrence of condition will improve Ability to  verbalize feelings will improve Ability to disclose and discuss suicidal ideas Ability to demonstrate self-control will improve Ability to identify and develop effective coping behaviors will improve     Medication Management: Evaluate patient's response, side effects, and tolerance of medication regimen.  Therapeutic Interventions: 1 to 1 sessions, Unit Group sessions and Medication administration.  Evaluation of Outcomes: Not Met   RN Treatment Plan for Primary Diagnosis: Severe recurrent major depression without psychotic features ([place]) Long Term Goal(s): Knowledge of disease and therapeutic regimen to maintain health will improve  Short Term Goals: Ability to participate in decision making will improve, Ability to verbalize feelings will improve, Ability to disclose and discuss suicidal ideas, Ability to identify and develop effective coping behaviors will improve and Compliance with prescribed medications will improve  Medication Management: RN will administer medications as ordered by provider, will assess and evaluate patient's response and provide education to patient for prescribed medication. RN will report any adverse and/or side effects to prescribing provider.  Therapeutic Interventions: 1 on 1 counseling sessions, Psychoeducation, Medication administration, Evaluate responses to treatment, Monitor vital signs and CBGs as ordered, Perform/monitor CIWA, COWS, AIMS and Fall Risk screenings as ordered, Perform wound care treatments as ordered.  Evaluation of Outcomes: Not Met   LCSW Treatment Plan for Primary Diagnosis: Severe recurrent major depression without psychotic features ([place]) Long Term Goal(s): Safe transition to appropriate next level of care at discharge, Engage patient in therapeutic group addressing interpersonal concerns.  Short Term Goals: Engage patient in aftercare planning with referrals and resources  Therapeutic Interventions: Assess for all discharge  needs, 1 to 1 time with Social worker, Explore available resources and support systems, Assess for adequacy in community support network, Educate family and significant other(s) on suicide prevention, Complete Psychosocial Assessment, Interpersonal group therapy.  Evaluation of Outcomes: Not Met   Progress in Treatment: Attending groups: No. Participating in groups: No. Taking medication as prescribed: Yes. Toleration medication: Yes. Family/Significant other contact made: No, will contact:  if patient consents to collateral contacts Patient understands diagnosis: Yes. Discussing patient identified problems/goals with staff: Yes. Medical problems stabilized or resolved: Yes. Denies suicidal/homicidal ideation: No. Issues/concerns per patient self-inventory: No. Other:   New problem(s) identified: None    New Short Term/Long Term Goal(s):  medication stabilization, elimination of SI thoughts, development of comprehensive mental wellness plan.    Patient Goals: "To go home with my kids"    Discharge Plan or Barriers: Patient recently admitted. CSW will continue to follow and assess for appropriate referrals and possible discharge planning.    Reason for Continuation of Hospitalization: Anxiety Depression Medication stabilization Suicidal ideation  Estimated Length of Stay: 3-5 days   Attendees: Patient: [name]  12/01/2019 11:28 AM  Physician: Dr. Myles Lipps, MD 12/01/2019 11:28 AM  Nursing:  12/01/2019 11:28 AM  RN Care Manager: 12/01/2019 11:28 AM  Social Worker: Radonna Ricker, LCSW 12/01/2019 11:28 AM  Recreational Therapist:  12/01/2019 11:28 AM  Other:  12/01/2019 11:28 AM  Other:  12/01/2019 11:28 AM  Other: 12/01/2019 11:28 AM    Scribe for Treatment Team: Marylee Floras, [place] 12/01/2019 11:28 AM

## 2019-12-01 NOTE — [facility] Group Notes (Addendum)
Adult Psychoeducational Wrap Up Group Note  Date:  12/01/2019 Time:  9:02 PM  Group Topic/Focus:  Wrap-Up Group:   The focus of this group is to help patients review their daily goal of treatment and discuss progress on daily workbooks.  Participation Level:  Active  Participation Quality:  Appropriate  Affect:  Flat and Lethargic  Cognitive:  Appropriate  Insight: Appropriate  Engagement in Group:  Developing/Improving  Modes of Intervention:  Discussion  Additional Comments:  Pt stated her goal was to go home.  Pt did speak with the dr and she believes she will possibly leave Friday or Saturday.  Pt rated the day at a 7/10. Brigit Doke 12/01/2019, 9:02 PM

## 2019-12-01 NOTE — Progress Notes (Signed)
Close observation note D:Pt observed sleeping in bed with eyes closed. RR even and unlabored. No distress noted. A: 1:1 observation continues for safety  R: pt remains safe  

## 2019-12-01 NOTE — Progress Notes (Signed)
[facility] Post 1:1 Observation Documentation  For the first (8) hours following discontinuation of 1:1 precautions, a progress note entry by nursing staff should be documented at least every 2 hours, reflecting the patient's behavior, condition, mood, and conversation.  Use the progress notes for additional entries.  Time 1:1 discontinued:  1430  Patient's Behavior:  Pt is pleasant and has interacted with a staff/peers appropriately.   Patient's Condition:  Pt is safe and has been out of their room more since their close obs was discontinued.  Patient's Conversation:  Pt is appropriate but is discussing discharge regularly with multiple members of staff.   Suszanne Conners Yanky Vanderburg 12/01/2019, 6:26 PM

## 2019-12-01 NOTE — Progress Notes (Signed)
Close obs note  D: Pt observed sleeping; allowed to rest. Upon awakening, pt was compliant with medication administration. Pt denies any physical complaints besides "lightheadedness" while standing too quickly. Pt was encouraged to push fluids. Pt is animated and pleasant on approach. Pt denies si/hi/ah/vh and verbally agrees to approach staff if these become apparent or before harming themself/others while at [facility]. Pt contracts for safety at this time. Physician wants to monitor pt through the morning and reevaluate close obs later today. A: Pt provided support and encouragement. Pt given medication per protocol and standing orders. Q99m safety checks implemented and continued.  R: Pt safe on the unit. Will continue to monitor.

## 2019-12-01 NOTE — Progress Notes (Signed)
[facility] Post 1:1 Observation Documentation  For the first (8) hours following discontinuation of 1:1 precautions, a progress note entry by nursing staff should be documented at least every 2 hours, reflecting the patient's behavior, condition, mood, and conversation.  Use the progress notes for additional entries.  Time 1:1 discontinued:  1430  Patient's Behavior:  Pt has been animated and cooperative with safety measures.  Patient's Condition:  Pt is safe and doesn't seem in distress with equal and unlabored respirations. Pt denies any physical or psychological symptoms.  Patient's Conversation:  Pt is animated and pleasant. Pt denies si/hi/ah/vh and verbally agrees to approach staff if these become apparent or before harming self/others while at [facility]. Q52m safety checks implemented and continued.   [name] 12/01/2019, 2:27 PM

## 2019-12-01 NOTE — Progress Notes (Signed)
[facility] MD Progress Note  12/01/2019 2:06 PM [name]  MRN:  [id number]   Subjective:  "I am feeling pretty good. Overall, I had an okay night. I am in a better head space. In a better mood. I have had time to reflect on what happened. I miss my kids and they miss mommy, but I realize if I had died that mommy wouldn't be going home at all. I have guilt about attempting suicide but glad that I am still living. I have decided I can't take on everyone else's problems and my own at the same time."  Objective: [name] is a 24 y.o. female who presented to [facility] Emergency Department on 11/30/2019 after a suicide attempt by hanging. The patient's boyfriend found her unconscious, contacted EMS, and started CPR. One evaluation today, the patient is sitting on the side of the bed in her room. She is alert and oriented x 4, pleasant, and cooeprtive. Speech is clear and coherent normal pace, normal volume. Mood is depressed and anxious. Affect is congruent with mood. She rates depression today as 3 out of 10 and anxiety as 3 out 10 with 10 being the most severe. She denies current suicidal ideations. Reports last SI was 11/30/2019. She is able to verbally contract for safety while in the hospital. She states that yesterday she was feeling irritable about being admitted and with being "bombarded with questions all day." States that she drinks alcohol about 2 times per month. States that she was drinking the night that she attempted suicide and feels that it may have impaired her decision making. She denies any symptoms of alcohol withdrawal. No distal tremor noted. Last CIWA 0 on 12/01/2019. She reports that she slept "fair" last night. Re chart review, she slept 5.25 hours. She denies auditory and visual hallucinations. No indication that she is responding to internal stimuli.    Collateral: Patient provided written consent to contact her mother for collateral information. Contacted the patient's mother,  [name] [phone], for collateral information. Ms. Donnetta Hutching reports that she was surprised to find out that [place] attempted suicide. She states "It just didn't make sense to me. That's just not something I thought she would do." She states that the patient has appeared to be more worried than usual. States that the patient has made statements recently that she has not yet accomplished what she wanted to at this age. States that she has not witnessed the patient isolating, crying, self-harming. States that she feels safe with the patient returning home. States that she is working from home the remainder of the week so that she could be at home with her daughter when she returns. States that she will support the patient in ensuring that she receives follow-up care. She states that there is no family history of completed suicide or suicide attempts. She is not aware of any family history of mental health issues or substance abuse. States that there are no guns/weapons in the home.   Principal Problem: Severe recurrent major depression without psychotic features (HCC) Diagnosis: Principal Problem:   Severe recurrent major depression without psychotic features (HCC)  Total Time spent with patient: 20 minutes  Past Psychiatric History: See admission H&P  Past Medical History:  Past Medical History:  Diagnosis Date  . Anxiety   . Depression   . Heart murmur   . Sickle cell trait [facility])     Past Surgical History:  Procedure Laterality Date  . NO PAST SURGERIES  Family History:  Family History  Problem Relation Age of Onset  . Hypertension Maternal Grandmother   . Healthy Mother   . Healthy Father    Family Psychiatric  History: See admission H&P  Social History:  Social History   Substance and Sexual Activity  Alcohol Use Not Currently   Comment: 11/29/2019: drank 1.5 bottles of wine and 1/2 bottle of vodka mixed withj juidce     Social History   Substance and Sexual  Activity  Drug Use No    Social History   Socioeconomic History  . Marital status: Single    Spouse name: Not on file  . Number of children: 2  . Years of education: 15.5  . Highest education level: Not on file  Occupational History  . Occupation: FULL TIME STUDENT    Comment: [employer]  Tobacco Use  . Smoking status: Never Smoker  . Smokeless tobacco: Never Used  Substance and Sexual Activity  . Alcohol use: Not Currently    Comment: 11/29/2019: drank 1.5 bottles of wine and 1/2 bottle of vodka mixed withj juidce  . Drug use: No  . Sexual activity: Not Currently    Birth control/protection: I.U.D.    Comment: Mirena   Other Topics Concern  . Not on file  Social History Narrative  . Not on file   Social Determinants of Health   Financial Resource Strain:   . Difficulty of Paying Living Expenses:   Food Insecurity:   . Worried About Programme researcher, broadcasting/film/video in the Last Year:   . Barista in the Last Year:   Transportation Needs:   . Freight forwarder (Medical):   Marland Kitchen Lack of Transportation (Non-Medical):   Physical Activity:   . Days of Exercise per Week:   . Minutes of Exercise per Session:   Stress:   . Feeling of Stress :   Social Connections:   . Frequency of Communication with Friends and Family:   . Frequency of Social Gatherings with Friends and Family:   . Attends Religious Services:   . Active Member of Clubs or Organizations:   . Attends Banker Meetings:   Marland Kitchen Marital Status:    Additional Social History:     Sleep: Fair  Appetite:  Fair  Current Medications: Current Facility-Administered Medications  Medication Dose Route Frequency Provider Last Rate Last Admin  . acetaminophen (TYLENOL) tablet 650 mg  650 mg Oral Q6H PRN Nira Conn A, NP      . alum & mag hydroxide-simeth (MAALOX/MYLANTA) 200-200-20 MG/5ML suspension 30 mL  30 mL Oral Q4H PRN Nira Conn A, NP      . folic acid (FOLVITE) tablet 1 mg  1 mg Oral Daily Aldean Baker, NP   1 mg at 12/01/19 0905  . hydrOXYzine (ATARAX/VISTARIL) tablet 25 mg  25 mg Oral TID PRN Nira Conn A, NP   25 mg at 11/30/19 2328  . loperamide (IMODIUM) capsule 2-4 mg  2-4 mg Oral PRN Aldean Baker, NP      . LORazepam (ATIVAN) tablet 1 mg  1 mg Oral Q6H PRN Aldean Baker, NP      . magnesium hydroxide (MILK OF MAGNESIA) suspension 30 mL  30 mL Oral Daily PRN Nira Conn A, NP      . multivitamin with minerals tablet 1 tablet  1 tablet Oral Daily Aldean Baker, NP   1 tablet at 12/01/19 0904  . ondansetron (ZOFRAN-ODT) disintegrating tablet 4  mg  4 mg Oral Q6H PRN Aldean BakerSykes, Janet E, NP   4 mg at 11/30/19 2327  . [START ON 12/02/2019] sertraline (ZOLOFT) tablet 50 mg  50 mg Oral Daily Nira ConnBerry, Devonda Pequignot A, NP      . thiamine tablet 100 mg  100 mg Oral Daily Aldean BakerSykes, Janet E, NP   100 mg at 12/01/19 0905  . traZODone (DESYREL) tablet 50 mg  50 mg Oral QHS PRN Jackelyn PolingBerry, Juanice Warburton A, NP        Lab Results:  Results for orders placed or performed during the hospital encounter of 11/29/19 (from the past 48 hour(s))  Respiratory Panel by RT PCR (Flu A&B, Covid) - Nasopharyngeal Swab     Status: None   Collection Time: 11/29/19  9:02 PM   Specimen: Nasopharyngeal Swab  Result Value Ref Range   SARS Coronavirus 2 by RT PCR NEGATIVE NEGATIVE    Comment: (NOTE) SARS-CoV-2 target nucleic acids are NOT DETECTED. The SARS-CoV-2 RNA is generally detectable in upper respiratoy specimens during the acute phase of infection. The lowest concentration of SARS-CoV-2 viral copies this assay can detect is 131 copies/mL. A negative result does not preclude SARS-Cov-2 infection and should not be used as the sole basis for treatment or other patient management decisions. A negative result may occur with  improper specimen collection/handling, submission of specimen other than nasopharyngeal swab, presence of viral mutation(s) within the areas targeted by this assay, and inadequate number of viral copies (<131  copies/mL). A negative result must be combined with clinical observations, patient history, and epidemiological information. The expected result is Negative. Fact Sheet for Patients:  https://www.moore.com/https://www.fda.gov/media/142436/download Fact Sheet for Healthcare Providers:  https://www.young.biz/https://www.fda.gov/media/142435/download This test is not yet ap proved or cleared by the [place] FDA and  has been authorized for detection and/or diagnosis of SARS-CoV-2 by FDA under an Emergency Use Authorization (EUA). This EUA will remain  in effect (meaning this test can be used) for the duration of the COVID-19 declaration under Section 564(b)(1) of the Act, 21 U.S.C. section 360bbb-3(b)(1), unless the authorization is terminated or revoked sooner.    Influenza A by PCR NEGATIVE NEGATIVE   Influenza B by PCR NEGATIVE NEGATIVE    Comment: (NOTE) The Xpert Xpress SARS-CoV-2/FLU/RSV assay is intended as an aid in  the diagnosis of influenza from Nasopharyngeal swab specimens and  should not be used as a sole basis for treatment. Nasal washings and  aspirates are unacceptable for Xpert Xpress SARS-CoV-2/FLU/RSV  testing. Fact Sheet for Patients: https://www.moore.com/https://www.fda.gov/media/142436/download Fact Sheet for Healthcare Providers: https://www.young.biz/https://www.fda.gov/media/142435/download This test is not yet approved or cleared by the [place] FDA and  has been authorized for detection and/or diagnosis of SARS-CoV-2 by  FDA under an Emergency Use Authorization (EUA). This EUA will remain  in effect (meaning this test can be used) for the duration of the  Covid-19 declaration under Section 564(b)(1) of the Act, 21  U.S.C. section 360bbb-3(b)(1), unless the authorization is  terminated or revoked. Performed at [facility], [address] [place], [place] [id number]   Comprehensive metabolic panel     Status: Abnormal   Collection Time: 11/29/19  9:08 PM  Result Value Ref Range   Sodium 141 135 - 145 mmol/L    Potassium 3.7 3.5 - 5.1 mmol/L   Chloride 109 98 - 111 mmol/L   CO2 24 22 - 32 mmol/L   Glucose, Bld 99 70 - 99 mg/dL    Comment: Glucose reference range applies only to samples  taken after fasting for at least 8 hours.   BUN <5 (L) 6 - 20 mg/dL   Creatinine, Ser 0.65 0.44 - 1.00 mg/dL   Calcium 9.1 8.9 - 10.3 mg/dL   Total Protein 7.2 6.5 - 8.1 g/dL   Albumin 4.1 3.5 - 5.0 g/dL   AST 18 15 - 41 U/L   ALT 13 0 - 44 U/L   Alkaline Phosphatase 70 38 - 126 U/L   Total Bilirubin 0.6 0.3 - 1.2 mg/dL   GFR calc non Af Amer >60 >60 mL/min   GFR calc Af Amer >60 >60 mL/min   Anion gap 8 5 - 15    Comment: Performed at [facility], [place] [address], [place], [place] [address]  Ethanol     Status: Abnormal   Collection Time: 11/29/19  9:08 PM  Result Value Ref Range   Alcohol, Ethyl (B) 21 (H) <10 mg/dL    Comment: (NOTE) Lowest detectable limit for serum alcohol is 10 mg/dL. For medical purposes only. Performed at [facility], [place] [address] [place], [place] [address]   CBC with Diff     Status: None   Collection Time: 11/29/19  9:08 PM  Result Value Ref Range   WBC 5.9 4.0 - 10.5 K/uL   RBC 4.52 3.87 - 5.11 MIL/uL   Hemoglobin 12.7 12.0 - 15.0 g/dL   HCT 38.5 36.0 - 46.0 %   MCV 85.2 80.0 - 100.0 fL   MCH 28.1 26.0 - 34.0 pg   MCHC 33.0 30.0 - 36.0 g/dL   RDW 12.3 11.5 - 15.5 %   Platelets 194 150 - 400 K/uL   nRBC 0.0 0.0 - 0.2 %   Neutrophils Relative % 58 %   Neutro Abs 3.5 1.7 - 7.7 K/uL   Lymphocytes Relative 29 %   Lymphs Abs 1.7 0.7 - 4.0 K/uL   Monocytes Relative 8 %   Monocytes Absolute 0.5 0.1 - 1.0 K/uL   Eosinophils Relative 4 %   Eosinophils Absolute 0.2 0.0 - 0.5 K/uL   Basophils Relative 1 %   Basophils Absolute 0.0 0.0 - 0.1 K/uL   Immature Granulocytes 0 %   Abs Immature Granulocytes 0.01 0.00 - 0.07 K/uL    Comment: Performed at [facility], [place] [address] [place], [place] [address]  I-Stat  beta hCG blood, ED     Status: None   Collection Time: 11/29/19  9:12 PM  Result Value Ref Range   I-stat hCG, quantitative <5.0 <5 mIU/mL   Comment 3            Comment:   GEST. AGE      CONC.  (mIU/mL)   <=1 WEEK        5 - 50     2 WEEKS       50 - 500     3 WEEKS       100 - 10,000     4 WEEKS     1,000 - 30,000        FEMALE AND NON-PREGNANT FEMALE:     LESS THAN 5 mIU/mL     Blood Alcohol level:  Lab Results  Component Value Date   ETH 21 (H) 11/29/2019   ETH <10 06/19/8526    Metabolic Disorder Labs: No results found for: HGBA1C, MPG No results found for: PROLACTIN Lab Results  Component Value Date   CHOL 123 11/12/2019   TRIG 62 11/12/2019  HDL 61 11/12/2019   CHOLHDL 2.0 11/12/2019   LDLCALC 49 11/12/2019   LDLCALC 75 08/15/2017    Physical Findings: AIMS:  , ,  ,  ,    CIWA:  CIWA-Ar Total: 0 COWS:     Musculoskeletal: Strength & Muscle Tone: within normal limits Gait & Station: normal Patient leans: N/A  Psychiatric Specialty Exam: Physical Exam  Nursing note and vitals reviewed. Constitutional: She is oriented to person, place, and time. She appears well-developed and well-nourished. No distress.  Cardiovascular: Normal rate.  Respiratory: Effort normal. No respiratory distress.  Musculoskeletal:        General: Normal range of motion.  Neurological: She is alert and oriented to person, place, and time.  Skin: She is not diaphoretic.  Psychiatric: Her mood appears anxious. Thought content is not paranoid and not delusional. She exhibits a depressed mood. She expresses no homicidal and no suicidal ideation.    Review of Systems  Constitutional: Negative.   HENT: Negative.   Respiratory: Negative for cough and shortness of breath.   Cardiovascular: Negative for chest pain and palpitations.  Gastrointestinal: Negative for diarrhea, nausea and vomiting.  Musculoskeletal: Negative.   Skin: Negative.   Neurological: Negative for dizziness and  headaches.  Psychiatric/Behavioral: Positive for dysphoric mood (Improving with medication and treatment) and sleep disturbance. Negative for hallucinations and suicidal ideas (Denies current SI. She is able to verbally contract for safety.). The patient is nervous/anxious (Improving with medication and treatment.). The patient is not hyperactive.   All other systems reviewed and are negative.   Blood pressure 119/72, pulse 78, temperature 98.8 F (37.1 C), temperature source Oral, resp. rate 18, height 5\' 10"  (1.778 m), weight 59 kg, SpO2 100 %, currently breastfeeding.Body mass index is 18.65 kg/m.  General Appearance: Casual and Well Groomed  Eye Contact:  Fair  Speech:  Clear and Coherent and Normal Rate  Volume:  Normal  Mood:  Anxious and Depressed  Affect:  Congruent  Thought Process:  Coherent, Goal Directed, Linear and Descriptions of Associations: Intact  Orientation:  Full (Time, Place, and Person)  Thought Content:  Logical and Hallucinations: None  Suicidal Thoughts:  Denies  Homicidal Thoughts:  No  Memory:  Immediate;   Good Recent;   Good Remote;   Good  Judgement:  Intact  Insight:  Fair  Psychomotor Activity:  Normal  Concentration:  Concentration: Fair and Attention Span: Fair  Recall:  Good  Fund of Knowledge:  Good  Language:  Good  Akathisia:  Negative  Handed:  Right  AIMS (if indicated):     Assets:  Communication Skills Desire for Improvement Financial Resources/Insurance Housing Leisure Time Physical Health  ADL's:  Intact  Cognition:  WNL  Sleep:  Number of Hours: 5.25     Treatment Plan Summary: Daily contact with patient to assess and evaluate symptoms and progress in treatment and Medication management   Continue inpatient admission for stabilization and treatment.  Increase sertraline to 50 mg daily for depression Continue hydroxyzine 25 mg TID prn for anxiety Continue ativan 1 mg every 6 hours prn for CIWA>10 Continue folic acid 1 mg  daily for nutritional supplementation  Continue MVI daily for daily for nutritional supplementation   , NP 12/01/2019, 2:06 PM

## 2019-12-01 NOTE — Plan of Care (Signed)
Close Obs note  Pt has been resting most of the morning, stating they didn't fall asleep til midnight last night. Pt's zoloft was increased. Pt was hesitant to take this, as they feel as though it may be making them drowsy. Pt provided education and pt compliant with medication administration. Pt has been viewed in group. Pt is minimal but pleasant and animated. Close obs continues. MHT within sight of pt. Pt safe on the unit. q67m safety checks implemented and continued. Will continue to monitor.

## 2019-12-01 NOTE — Plan of Care (Signed)
Pt progressing in the following metrics  Problem: Education: Goal: Emotional status will improve Outcome: Progressing Goal: Mental status will improve Outcome: Progressing   Problem: Activity: Goal: Sleeping patterns will improve Outcome: Progressing   Problem: Coping: Goal: Ability to verbalize frustrations and anger appropriately will improve Outcome: Progressing   Problem: Physical Regulation: Goal: Ability to maintain clinical measurements within normal limits will improve Outcome: Progressing   Problem: Safety: Goal: Periods of time without injury will increase Outcome: Progressing   Problem: Coping: Goal: Coping ability will improve Outcome: Progressing   Problem: Health Behavior/Discharge Planning: Goal: Identification of resources available to assist in meeting health care needs will improve Outcome: Progressing

## 2019-12-01 NOTE — [facility] Group Notes (Signed)
LCSW Relapse Prevention and Social Support Group Note   12/01/2019 1315  Type of Group and Topic: Psychoeducational Group:  Relapse prevention and social support.   Participation Level:  moderate  Description of Group  Relapse prevention and developing social support group identifies mental health triggers and early warning signs as a first step towards developing appropriate coping skills.  This can include a relapse of mental health or substance use symptoms.  With the help of examples, patients are encouraged to recognize and intervene when symptoms return before a crisis occurs.  Patients are also engaged to evaluate their current support network and to consider ways to increase and broaden that network.    Therapeutic Goals 1. Patients will identify triggers and early symptoms related to both mental health and substance use relapses. 2. Patients will begin the process of identifying plans/coping skills to manage these symptoms before they escalate to a crisis. 3. Patients will consider individuals in their current support network, whether they are positive or negative supports, and look at options to increase the number of positive supports in their network.    Summary of Patient Progress: Pt had head down and not very active in group discussion.  Pt did respond when asked questions by CSW.  Pt did not identify specifics with regards to triggers/early warning signs/supports.  Pt did appear to be listening and had appropriate answers to CSW questions.      Therapeutic Modalities: Cognitive Behavioral Therapy Psychoeducation    Daleen Squibb, MSW, LCSW

## 2019-12-01 NOTE — Progress Notes (Signed)
Nursing 1:1 note D:Pt observed in hallway. RR even and unlabored. No distress noted.  A: 1:1 observation continues for safety  R: pt remains safe

## 2019-12-01 NOTE — Progress Notes (Addendum)
[facility] Post 1:1 Observation Documentation  For the first (8) hours following discontinuation of 1:1 precautions, a progress note entry by nursing staff should be documented at least every 2 hours, reflecting the patient's behavior, condition, mood, and conversation.  Use the progress notes for additional entries.  Time 1:1 discontinued:  1430  Patient's Behavior:  Pt has been viewed in the dayroom interacting with peers.  Patient's Condition:  Pt denies any physical/physcological complaints.  Patient's Conversation:  Pt is animated and inquisitive regarding care and follow up.  Suszanne Conners Emile Ringgenberg 12/01/2019, 5:21 PM

## 2019-12-02 MED ORDER — BOOST / RESOURCE BREEZE PO LIQD CUSTOM
1.0000 | ORAL | Status: DC
  Administered 2019-12-02: 1 via ORAL
  Filled 2019-12-02 (×2): qty 1

## 2019-12-02 NOTE — Progress Notes (Signed)
DAR NOTE: Patient presents with calm affect and pleasant mood.  Denies suicidal thoughts, pain, auditory and visual hallucinations.  Described energy level as normal and concentration as good.  Rates depression at 4, hopelessness at 3, and anxiety at 4.  Maintained on routine safety checks.  Medications given as prescribed.  Support and encouragement offered as needed.  Attended group and participated.  States goal for today is "talking about my problems."  Patient visible in milieu interacting with peers and staff.  Offered no complaint.

## 2019-12-02 NOTE — [facility] Suicide Risk Assessment (Signed)
[facility] INPATIENT:  Family/Significant Other Suicide Prevention Education  Suicide Prevention Education:  Education Completed; with mother, [name] [phone]) has been identified by the patient as the family member/significant other with whom the patient will be residing, and identified as the person(s) who will aid the patient in the event of a mental health crisis (suicidal ideations/suicide attempt).  With written consent from the patient, the family member/significant other has been provided the following suicide prevention education, prior to the and/or following the discharge of the patient.  The suicide prevention education provided includes the following:  Suicide risk factors  Suicide prevention and interventions  National Suicide Hotline telephone number  [facility] assessment telephone number  [facility] Emergency Assistance 911  [facility] and/or Residential Mobile Crisis Unit telephone number  Request made of family/significant other to:  Remove weapons (e.g., guns, rifles, knives), all items previously/currently identified as safety concern.    Remove drugs/medications (over-the-counter, prescriptions, illicit drugs), all items previously/currently identified as a safety concern.  The family member/significant other verbalizes understanding of the suicide prevention education information provided.  The family member/significant other agrees to remove the items of safety concern listed above.  Patient's mother states Patient's mother reports she is agreeable with the patient discharge plans. She states she feels the patient has improved since being in the hospital. CSW shared the patient's discharge plans and the patient's mother reports she is agreeable with the patient discharge plans.   Patient's mother reports she will like an update regarding a discharge time for tomorrow. CSW explained that a discharge time would be known after tomorrow's  morning progression meeting. Patient's mother expressed understanding and states she has no further questions or concerns at this time.    Maeola Sarah 12/02/2019, 1:23 PM
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
## 2019-12-02 NOTE — Progress Notes (Signed)
[facility] Group Notes:  (Nursing/MHT/Case Management/Adjunct)  Date:  12/02/2019  Time: 2030  Type of Therapy:  wrap up group  Participation Level:  Active  Participation Quality:  Appropriate, Attentive, Sharing and Supportive  Affect:  Appropriate  Cognitive:  Appropriate  Insight:  Improving  Engagement in Group:  Engaged  Modes of Intervention:  Clarification, Education and Support  Summary of Progress/Problems: Positive thinking and positive change were discussed. Pt shared she plans on expressing her emotions more rather than bottling them up. Pt is grateful for her two children and is looking forward to a discharge tomorrow.  Marcille Buffy 12/02/2019, 9:12 PM

## 2019-12-02 NOTE — Progress Notes (Signed)
Pt stated she was feeling better, pt appeared to have good insight into her Tx.   12/02/19 2100  Psych Admission Type (Psych Patients Only)  Admission Status Involuntary  Psychosocial Assessment  Patient Complaints None  Eye Contact Fair  Facial Expression Animated;Anxious  Affect Anxious;Appropriate to circumstance;Preoccupied  Speech Logical/coherent  Interaction Assertive  Motor Activity Slow  Appearance/Hygiene Unremarkable  Behavior Characteristics Cooperative  Mood Pleasant  Thought Process  Coherency WDL  Content WDL  Delusions None reported or observed  Perception WDL  Hallucination None reported or observed  Judgment Poor  Confusion None  Danger to Self  Current suicidal ideation? Denies  Danger to Others  Danger to Others None reported or observed

## 2019-12-02 NOTE — Progress Notes (Addendum)
NUTRITION ASSESSMENT RD working remotely.   Consult received for assessment of nutrition requirements/status.  INTERVENTION: - please ensure that non-meat entrees are available to patient for each meal. - will order Boost Breeze once/day, each supplement provides 250 kcal and 9 grams of protein. - continue Ensure Enlive BID, each supplement provides 350 kcal and 20 grams of protein.   NUTRITION DIAGNOSIS: Unintentional weight loss related to sub-optimal intake as evidenced by pt report.   Goal: Pt to meet >/= 90% of their estimated nutrition needs.  Monitor:  PO intake  Assessment:  Patient was admitted following a suicide attempt by hanging. Patient was found by her boyfriend who performed CPR and called EMS. When patient arrived to the ED, she reported ongoing suicidal ideation. She reported she began cutting herself again in February 2021 after not doing so for 6 years.   MST score indicates patient reported decreased appetite but no weight loss. Per chart review, weight on admission (4/6) was 130 lb, which appears to be a stated weight. Weight on 4/5 was 140 lb, weight on 3/19 was 135 lb, and weight on 2/10 was 140 lb.   Patient was on 1:1 observation until 4/7 at 1830. She is currently denying SI. She is ordered a Vegetarian diet. Please ensure that non-meat entree items are available to patient for each meal.     24 y.o. female  Height: Ht Readings from Last 1 Encounters:  11/30/19 5\' 10"  (1.778 m)    Weight: Wt Readings from Last 1 Encounters:  11/30/19 59 kg    Weight Hx: Wt Readings from Last 10 Encounters:  11/30/19 59 kg  11/29/19 63.5 kg  11/12/19 61.2 kg  10/06/19 63.5 kg  03/12/19 63 kg  02/12/19 63.9 kg  01/15/19 64 kg  12/17/18 65.8 kg  11/06/18 75.7 kg  11/02/18 75.8 kg    BMI:  Body mass index is 18.65 kg/m. Pt meets criteria for borderline underweight based on current BMI.  Estimated Nutritional Needs: Kcal: 25-30 kcal/kg Protein: > 1  gram protein/kg Fluid: 1 ml/kcal  Diet Order:  Diet Order            Diet vegetarian Room service appropriate? Yes; Fluid consistency: Thin  Diet effective now             Pt is also offered choice of unit snacks mid-morning and mid-afternoon.  Pt is eating as desired.   Lab results and medications reviewed.     01/02/19, MS, RD, LDN, CNSC Inpatient Clinical Dietitian RD pager # available in [facility]  After hours/weekend pager # available in [facility]

## 2019-12-02 NOTE — Progress Notes (Signed)
   12/02/19 0000  Psych Admission Type (Psych Patients Only)  Admission Status Involuntary  Psychosocial Assessment  Patient Complaints Anxiety  Eye Contact Fair  Facial Expression Animated;Anxious  Affect Anxious;Appropriate to circumstance;Preoccupied  Speech Logical/coherent  Interaction Assertive  Motor Activity Slow  Appearance/Hygiene Unremarkable  Behavior Characteristics Cooperative  Mood Anxious  Thought Process  Coherency WDL  Content WDL  Delusions None reported or observed  Perception WDL  Hallucination None reported or observed  Judgment Poor  Confusion None  Danger to Self  Current suicidal ideation? Denies  Danger to Others  Danger to Others None reported or observed

## 2019-12-02 NOTE — Progress Notes (Signed)
[facility] MD Progress Note  12/02/2019 9:47 AM [name]  MRN:  [id number] Subjective: Patient is a 24 year old female who presented to the [facility] emergency department on 11/30/2019 after suicide attempt by hanging.  Objective: Patient is seen and examined.  Patient is a 24 year old female with a past psychiatric history significant for major depression who is seen in follow-up.  She continues to slowly improve.  She is able to smile and engage today.  She has spoken to her mother since she has been in the hospital.  She admitted she still wants to get out of hospital to be able to see her children, but that she is not having any suicidal ideation today, and I believe her today after yesterday's conversation.  We increased her Zoloft to 50 mg p.o. daily yesterday, and she is tolerating that well.  She denied any side effects to her current medications.  She denied any suicidal or homicidal ideation, she denied any auditory or visual hallucinations.  Her vital signs are stable, she is afebrile.  Her CIWA this morning was 2.  She slept 6.25 hours last night.  Principal Problem: Severe recurrent major depression without psychotic features (HCC) Diagnosis: Principal Problem:   Severe recurrent major depression without psychotic features (HCC)  Total Time spent with patient: 20 minutes  Past Psychiatric History: See admission H&P  Past Medical History:  Past Medical History:  Diagnosis Date  . Anxiety   . Depression   . Heart murmur   . Sickle cell trait [facility])     Past Surgical History:  Procedure Laterality Date  . NO PAST SURGERIES     Family History:  Family History  Problem Relation Age of Onset  . Hypertension Maternal Grandmother   . Healthy Mother   . Healthy Father    Family Psychiatric  History: See admission H&P Social History:  Social History   Substance and Sexual Activity  Alcohol Use Not Currently   Comment: 11/29/2019: drank 1.5 bottles of wine  and 1/2 bottle of vodka mixed withj juidce     Social History   Substance and Sexual Activity  Drug Use No    Social History   Socioeconomic History  . Marital status: Single    Spouse name: Not on file  . Number of children: 2  . Years of education: 15.5  . Highest education level: Not on file  Occupational History  . Occupation: FULL TIME STUDENT    Comment: [employer]  Tobacco Use  . Smoking status: Never Smoker  . Smokeless tobacco: Never Used  Substance and Sexual Activity  . Alcohol use: Not Currently    Comment: 11/29/2019: drank 1.5 bottles of wine and 1/2 bottle of vodka mixed withj juidce  . Drug use: No  . Sexual activity: Not Currently    Birth control/protection: I.U.D.    Comment: Mirena   Other Topics Concern  . Not on file  Social History Narrative  . Not on file   Social Determinants of Health   Financial Resource Strain:   . Difficulty of Paying Living Expenses:   Food Insecurity:   . Worried About Programme researcher, broadcasting/film/video in the Last Year:   . Barista in the Last Year:   Transportation Needs:   . Freight forwarder (Medical):   Marland Kitchen Lack of Transportation (Non-Medical):   Physical Activity:   . Days of Exercise per Week:   . Minutes of Exercise per Session:   Stress:   .  Feeling of Stress :   Social Connections:   . Frequency of Communication with Friends and Family:   . Frequency of Social Gatherings with Friends and Family:   . Attends Religious Services:   . Active Member of Clubs or Organizations:   . Attends Archivist Meetings:   Marland Kitchen Marital Status:    Additional Social History:                         Sleep: Good  Appetite:  Good  Current Medications: Current Facility-Administered Medications  Medication Dose Route Frequency Provider Last Rate Last Admin  . acetaminophen (TYLENOL) tablet 650 mg  650 mg Oral Q6H PRN Lindon Romp A, NP      . alum & mag hydroxide-simeth (MAALOX/MYLANTA) 200-200-20 MG/5ML  suspension 30 mL  30 mL Oral Q4H PRN Lindon Romp A, NP      . feeding supplement (ENSURE ENLIVE) (ENSURE ENLIVE) liquid 237 mL  237 mL Oral BID BM Sharma Covert, MD   237 mL at 12/01/19 1704  . folic acid (FOLVITE) tablet 1 mg  1 mg Oral Daily Connye Burkitt, NP   1 mg at 12/02/19 0809  . hydrOXYzine (ATARAX/VISTARIL) tablet 25 mg  25 mg Oral TID PRN Lindon Romp A, NP   25 mg at 11/30/19 2328  . loperamide (IMODIUM) capsule 2-4 mg  2-4 mg Oral PRN Connye Burkitt, NP      . LORazepam (ATIVAN) tablet 1 mg  1 mg Oral Q6H PRN Connye Burkitt, NP      . magnesium hydroxide (MILK OF MAGNESIA) suspension 30 mL  30 mL Oral Daily PRN Lindon Romp A, NP      . multivitamin with minerals tablet 1 tablet  1 tablet Oral Daily Connye Burkitt, NP   1 tablet at 12/02/19 0809  . ondansetron (ZOFRAN-ODT) disintegrating tablet 4 mg  4 mg Oral Q6H PRN Connye Burkitt, NP   4 mg at 11/30/19 2327  . sertraline (ZOLOFT) tablet 50 mg  50 mg Oral Daily Lindon Romp A, NP   50 mg at 12/02/19 0809  . thiamine tablet 100 mg  100 mg Oral Daily Connye Burkitt, NP   100 mg at 12/02/19 0809  . traZODone (DESYREL) tablet 50 mg  50 mg Oral QHS PRN Rozetta Nunnery, NP        Lab Results:  Results for orders placed or performed during the hospital encounter of 11/30/19 (from the past 48 hour(s))  Lipid panel     Status: None   Collection Time: 12/01/19  5:53 PM  Result Value Ref Range   Cholesterol 117 0 - 200 mg/dL   Triglycerides 45 <150 mg/dL   HDL 55 >40 mg/dL   Total CHOL/HDL Ratio 2.1 RATIO   VLDL 9 0 - 40 mg/dL   LDL Cholesterol 53 0 - 99 mg/dL    Comment:        Total Cholesterol/HDL:CHD Risk Coronary Heart Disease Risk Table                     Men   Women  1/2 Average Risk   3.4   3.3  Average Risk       5.0   4.4  2 X Average Risk   9.6   7.1  3 X Average Risk  23.4   11.0        Use the calculated Patient  Ratio above and the CHD Risk Table to determine the patient's CHD Risk.        ATP III  CLASSIFICATION (LDL):  <100     mg/dL   Optimal  202-542  mg/dL   Near or Above                    Optimal  130-159  mg/dL   Borderline  706-237  mg/dL   High  >628     mg/dL   Very High Performed at [facility], [address], [place], [place] [address]     Blood Alcohol level:  Lab Results  Component Value Date   ETH 21 (H) 11/29/2019   ETH <10 10/06/2019    Metabolic Disorder Labs: No results found for: HGBA1C, MPG No results found for: PROLACTIN Lab Results  Component Value Date   CHOL 117 12/01/2019   TRIG 45 12/01/2019   HDL 55 12/01/2019   CHOLHDL 2.1 12/01/2019   VLDL 9 12/01/2019   LDLCALC 53 12/01/2019   LDLCALC 49 11/12/2019    Physical Findings: AIMS:  , ,  ,  ,    CIWA:  CIWA-Ar Total: 2 COWS:     Musculoskeletal: Strength & Muscle Tone: within normal limits Gait & Station: normal Patient leans: N/A  Psychiatric Specialty Exam: Physical Exam  Nursing note and vitals reviewed. Constitutional: She is oriented to person, place, and time. She appears well-developed and well-nourished.  HENT:  Head: Normocephalic and atraumatic.  Respiratory: Effort normal.  Neurological: She is alert and oriented to person, place, and time.    Review of Systems  Blood pressure 117/77, pulse 91, temperature 98.3 F (36.8 C), temperature source Oral, resp. rate 18, height 5\' 10"  (1.778 m), weight 59 kg, SpO2 100 %, currently breastfeeding.Body mass index is 18.65 kg/m.  General Appearance: Casual  Eye Contact:  Good  Speech:  Normal Rate  Volume:  Normal  Mood:  Euthymic  Affect:  Congruent  Thought Process:  Coherent and Descriptions of Associations: Intact  Orientation:  Full (Time, Place, and Person)  Thought Content:  Logical  Suicidal Thoughts:  No  Homicidal Thoughts:  No  Memory:  Immediate;   Good Recent;   Good Remote;   Good  Judgement:  Intact  Insight:  Fair  Psychomotor Activity:  Normal  Concentration:  Concentration:  Good and Attention Span: Good  Recall:  Good  Fund of Knowledge:  Good  Language:  Good  Akathisia:  Negative  Handed:  Right  AIMS (if indicated):     Assets:  Communication Skills Desire for Improvement Housing Resilience Social Support  ADL's:  Intact  Cognition:  WNL  Sleep:  Number of Hours: 6.25     Treatment Plan Summary: Daily contact with patient to assess and evaluate symptoms and progress in treatment, Medication management and Plan : Patient is seen and examined.  Patient is a 24 year old female with the above-stated past psychiatric history who is seen in follow-up.   Diagnosis: #1 major depression, recurrent, severe without psychotic features, #2 alcohol use disorder, #3 probable posttraumatic stress disorder  Patient is seen in follow-up.  She is doing much better.  Her insight towards what occurred has improved.  She is not having any suicidal ideation or side effects to her current medications.  We will continue her medications as is, and if she continues to improve I would anticipate discharge in 1 to 2 days.  1.  Continue folic acid  1 mg p.o. daily for nutritional supplementation. 2.  Continue hydroxyzine 25 mg p.o. 3 times daily as needed anxiety. 3.  Continue lorazepam 1 mg p.o. every 6 hours as needed a CIWA greater than 10. 4.  Continue sertraline 50 mg p.o. daily for depression and anxiety. 5.  Continue thiamine 100 mg p.o. daily for nutritional supplementation. 6.  Continue trazodone 50 mg p.o. nightly as needed insomnia. 7.  Disposition planning-in progress.  Antonieta Pert, MD 12/02/2019, 9:47 AM

## 2019-12-03 LAB — HEMOGLOBIN A1C
Hgb A1c MFr Bld: 5.3 % (ref 4.8–5.6)
Mean Plasma Glucose: 105 mg/dL

## 2019-12-03 MED ORDER — HYDROXYZINE HCL 25 MG PO TABS: 25.0000 mg | ORAL_TABLET | Freq: Three times a day (TID) | ORAL | 0 refills | Status: DC | PRN

## 2019-12-03 MED ORDER — TRAZODONE HCL 50 MG PO TABS: 50.0000 mg | ORAL_TABLET | Freq: Every evening | ORAL | 0 refills | Status: DC | PRN

## 2019-12-03 MED ORDER — SERTRALINE HCL 50 MG PO TABS: 50.0000 mg | ORAL_TABLET | Freq: Every day | ORAL | 0 refills | Status: DC

## 2019-12-03 NOTE — Progress Notes (Signed)
Pt discharged to lobby. Pt was stable and appreciative at that time. All papers, samples and prescriptions were given and valuables returned. Verbal understanding expressed. Denies SI/HI and A/VH. Pt given opportunity to express concerns and ask questions.  
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
## 2019-12-03 NOTE — Progress Notes (Signed)
  [facility] Adult Case Management Discharge Plan :  Will you be returning to the same living situation after discharge:  Yes,  home. At discharge, do you have transportation home?: Yes,  grandmother will pick up at 10:30am. Do you have the ability to pay for your medications: Yes,  [employer] insurance  Release of information consent forms completed and in the chart;  Patient's signature needed at discharge.  Patient to Follow up at: Follow-up Information    BEHAVIORAL HEALTH PARTIAL HOSPITALIZATION PROGRAM. Call on 12/06/2019.   Specialty: Behavioral Health Why: Your initial appointment for partial hospitalization services is Monday, 12/06/19 at 12:00p. A webex link will be sent to your email provided. Be sure to have your discharge paperwork available during this appointment. Contact information: [id number] mc [place] [address] [phone]          Next level of care provider has access to [facility] Link:yes  Safety Planning and Suicide Prevention discussed: Yes,  with mother.  Has patient been referred to the Quitline?: N/A patient is not a smoker  Patient has been referred for addiction treatment: Yes  Darreld Mclean, LCSWA 12/03/2019, 9:10 AM

## 2019-12-03 NOTE — BHH Suicide Risk Assessment (Signed)
[facility] Discharge Suicide Risk Assessment   Principal Problem: Severe recurrent major depression without psychotic features [facility]) Discharge Diagnoses: Principal Problem:   Severe recurrent major depression without psychotic features ([facility])   Total Time spent with patient: 20 minutes  Musculoskeletal: Strength & Muscle Tone: within normal limits Gait & Station: normal Patient leans: N/A  Psychiatric Specialty Exam: Review of Systems  All other systems reviewed and are negative.   Blood pressure 118/81, pulse (!) 102, temperature 97.9 F (36.6 C), temperature source Oral, resp. rate 16, height 5\' 10"  (1.778 m), weight 59 kg, SpO2 100 %, currently breastfeeding.Body mass index is 18.65 kg/m.  General Appearance: Casual  Eye Contact::  Good  Speech:  Normal Rate409  Volume:  Normal  Mood:  Euthymic  Affect:  Congruent  Thought Process:  Coherent and Descriptions of Associations: Intact  Orientation:  Full (Time, Place, and Person)  Thought Content:  Logical  Suicidal Thoughts:  No  Homicidal Thoughts:  No  Memory:  Immediate;   Good Recent;   Good Remote;   Good  Judgement:  Intact  Insight:  Good  Psychomotor Activity:  Normal  Concentration:  Good  Recall:  Good  Fund of Knowledge:Good  Language: Good  Akathisia:  Negative  Handed:  Right  AIMS (if indicated):     Assets:  Communication Skills Desire for Improvement Housing Resilience Social Support Talents/Skills  Sleep:  Number of Hours: 6.25  Cognition: WNL  ADL's:  Intact   Mental Status Per Nursing Assessment::   On Admission:  Self-harm behaviors  Demographic Factors:  Low socioeconomic status  Loss Factors: NA  Historical Factors: Impulsivity  Risk Reduction Factors:   Responsible for children under 52 years of age, Sense of responsibility to family, Living with another person, especially a relative, Positive social support and Positive coping skills or problem solving skills  Continued Clinical  Symptoms:  Depression:   Impulsivity  Cognitive Features That Contribute To Risk:  None    Suicide Risk:  Minimal: No identifiable suicidal ideation.  Patients presenting with no risk factors but with morbid ruminations; may be classified as minimal risk based on the severity of the depressive symptoms  Follow-up Information    BEHAVIORAL HEALTH PARTIAL HOSPITALIZATION PROGRAM. Call on 12/06/2019.   Specialty: Behavioral Health Why: Your initial appointment for partial hospitalization services is Monday, 12/06/19 at 12:00p. A webex link will be sent to your email provided. Be sure to have your discharge paperwork available during this appointment. Contact information: [address] [place] [phone]          Plan Of Care/Follow-up recommendations:  Activity:  ad lib  [phone], MD 12/03/2019, 7:27 AM

## 2019-12-06 ENCOUNTER — Other Ambulatory Visit (HOSPITAL_COMMUNITY): Payer: 59 | Attending: Psychiatry | Admitting: Licensed Clinical Social Worker

## 2019-12-06 ENCOUNTER — Other Ambulatory Visit: Payer: Self-pay

## 2019-12-06 DIAGNOSIS — F332 Major depressive disorder, recurrent severe without psychotic features: Secondary | ICD-10-CM | POA: Insufficient documentation

## 2019-12-13 ENCOUNTER — Ambulatory Visit: Payer: Self-pay | Admitting: *Deleted

## 2019-12-13 NOTE — Chronic Care Management (AMB) (Signed)
    Care Management   Unsuccessful Call Note 12/13/2019 Name: Carie Kapuscinski MRN: [id number] DOB: [birth year]  Patient is a 24 year old female who sees Osvaldo Angst, [place] for primary care. Osvaldo Angst, PA-C asked the CCM team to consult the patient for Mental Health Counseling and Resources.  Referral was placed on 11/12/19. Patient's last office visit was 11/12/19.     Was unable to reach patient via telephone today to introduce services and obtain consent. I have left HIPAA compliant voicemail asking patient to return my call. (unsuccessful outreach #2).   Plan: Will follow-up within 7 business days via telephone.      Verna Czech, LCSW Clinical Social Worker  [facility] Care Management [phone]

## 2019-12-14 ENCOUNTER — Ambulatory Visit: Payer: Self-pay | Admitting: *Deleted

## 2019-12-14 DIAGNOSIS — F1010 Alcohol abuse, uncomplicated: Secondary | ICD-10-CM

## 2019-12-14 DIAGNOSIS — F32A Depression, unspecified: Secondary | ICD-10-CM

## 2019-12-14 DIAGNOSIS — F329 Major depressive disorder, single episode, unspecified: Secondary | ICD-10-CM

## 2019-12-15 NOTE — Chronic Care Management (AMB) (Signed)
Care Management    Clinical Social Work General Note  12/15/2019 Name: [name] MRN: [id number] DOB: 02-12-1996  [name] is a 24 y.o. year old female who is a primary care patient of Trey Sailors, [place]. The CCM was consulted to assist the patient with Mental Health Counseling and Resources.   [name] was given information about  Care Management services today including:  1. CM service includes personalized support from designated clinical staff supervised by her physician, including individualized plan of care and coordination with other care providers 2. 24/7 contact phone numbers for assistance for urgent and routine care needs. 3. The patient may stop CCM services at any time (effective at the end of the month) by phone call to the office staff.   Patient agreed to services and verbal consent obtained.   Review of patient status, including review of consultants reports, relevant laboratory and other test results, and collaboration with appropriate care team members and the patient's provider was performed as part of comprehensive patient evaluation and provision of chronic care management services.    SDOH (Social Determinants of Health) assessments and interventions performed:  Yes    Outpatient Encounter Medications as of 12/14/2019  Medication Sig  . hydrOXYzine (ATARAX/VISTARIL) 25 MG tablet Take 1 tablet (25 mg total) by mouth 3 (three) times daily as needed for anxiety.  Marland Kitchen ibuprofen (ADVIL,MOTRIN) 600 MG tablet Take 1 tablet (600 mg total) by mouth every 6 (six) hours. (Patient not taking: Reported on 03/12/2019)  . levonorgestrel (MIRENA) 20 MCG/24HR IUD 1 each by Intrauterine route once.  . Multiple Vitamins-Minerals (ONE DAILY MULTIVITAMIN ADULT) TABS Take 1 tablet by mouth daily.  . sertraline (ZOLOFT) 50 MG tablet Take 1 tablet (50 mg total) by mouth daily. For depression  . traZODone (DESYREL) 50 MG tablet Take 1 tablet (50 mg total) by mouth at  bedtime as needed for sleep.   No facility-administered encounter medications on file as of 12/14/2019.    Goals Addressed            This Visit's Progress   . I really don't like to talk much but I wll try talking to a therapist" (pt-stated)       Current Barriers:  . Chronic Mental Health needs related to anxiety and depression . Mental Health Concerns  . Suicidal Ideation/Homicidal Ideation: No  Clinical Social Work Goal(s):  Marland Kitchen Over the next 90 days, patient will work with SW bi-weekly by telephone or in person to reduce or manage symptoms related to depression and anxiety . Over the next 90 days, patient will follow up with local mental health therapist* as directed by SW  Interventions: . Patient interviewed and appropriate assessments performed: PHQ 2 and PHQ 9 .  Explored patient's current support system and coping strategies . Patient interviewed and appropriate assessments performed . Provided patient with information about local mental health therapists to follow up with for ongoing mental health counseling . Patient discussed recommendation for partial hospitalization, however due to school schedule and parenting responsibilities she is not able to attend. . Patient agreed to follow up with outpatient treatment  . Discussed plans with patient for ongoing care management follow up and provided patient with direct contact information for care management team   Patient Self Care Activities:  . Performs ADL's independently . Performs IADL's independently  Patient Coping Strengths:  . Supportive Relationships . Able to Communicate Effectively  Patient Self Care Deficits:  . Patient has limited knowledge of  local mental health provider  Initial goal documentation         Follow Up Plan: SW will follow up with patient by phone over the next 2 weeks        [name], [place] Worker  [place]  Management [phone]

## 2019-12-15 NOTE — Patient Instructions (Signed)
Thank you allowing the Chronic Care Management Team to be a part of your care! It was a pleasure speaking with you today!  1. Please contact a local mental health provider of choice for ongoing mental health follow up.  CCM (Chronic Care Management) Team   Juanell Fairly  RN, BSN Nurse Care Coordinator  [phone]   [place], LCSW Clinical Social Worker [phone]  Goals Addressed            This Visit's Progress   . I really don't like to talk much but I wll try talking to a therapist" (pt-stated)       Current Barriers:  . Chronic Mental Health needs related to anxiety and depression . Mental Health Concerns  . Suicidal Ideation/Homicidal Ideation: No  Clinical Social Work Goal(s):  Marland Kitchen Over the next 90 days, patient will work with SW bi-weekly by telephone or in person to reduce or manage symptoms related to depression and anxiety . Over the next 90 days, patient will follow up with local mental health therapist* as directed by SW  Interventions: . Patient interviewed and appropriate assessments performed: PHQ 2 and PHQ 9 .  Explored patient's current support system and coping strategies . Patient interviewed and appropriate assessments performed . Provided patient with information about local mental health therapists to follow up with for ongoing mental health counseling . Patient discussed recommendation for partial hospitalization, however due to school schedule and parenting responsibilities she is not able to attend. . Patient agreed to follow up with outpatient treatment  . Discussed plans with patient for ongoing care management follow up and provided patient with direct contact information for care management team   Patient Self Care Activities:  . Performs ADL's independently . Performs IADL's independently  Patient Coping Strengths:  . Supportive Relationships . Able to Communicate Effectively  Patient Self Care Deficits:  . Patient has  limited knowledge of local mental health provider  Initial goal documentation         The patient verbalized understanding of instructions provided today and declined a print copy of patient instruction materials.   Telephone follow up appointment with care management team member scheduled for: 12/23/19
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
## 2019-12-23 ENCOUNTER — Telehealth: Payer: Self-pay | Admitting: *Deleted

## 2019-12-23 ENCOUNTER — Ambulatory Visit: Payer: Self-pay | Admitting: *Deleted

## 2019-12-23 NOTE — Chronic Care Management (AMB) (Signed)
   Chronic Care Management   Unsuccessful Call Note 12/23/2019 Name: [name] MRN: [id number] DOB: [birth year]  Patient is a 24 year old female who sees Osvaldo Angst, [place] for primary care. Osvaldo Angst, PA-C asked the CCM team to consult the patient for Mental Health Counseling and Resources.   This social worker was unable to reach patient via telephone today for follow up call. I have left HIPAA compliant voicemail asking patient to return my call. (unsuccessful outreach #1).   Plan: Will follow-up within 7 business days via telephone.      Verna Czech, LCSW Clinical Social Worker  [facility] Care Management [phone]

## 2019-12-27 ENCOUNTER — Ambulatory Visit: Payer: Self-pay | Admitting: *Deleted

## 2019-12-27 DIAGNOSIS — F329 Major depressive disorder, single episode, unspecified: Secondary | ICD-10-CM

## 2019-12-27 DIAGNOSIS — F1010 Alcohol abuse, uncomplicated: Secondary | ICD-10-CM

## 2019-12-27 DIAGNOSIS — F32A Depression, unspecified: Secondary | ICD-10-CM

## 2019-12-27 NOTE — Chronic Care Management (AMB) (Signed)
   Care Management    Clinical Social Work Follow Up Note  12/27/2019 Name: [name] MRN: [id number] DOB: 12/13/1995  [name] is a 24 y.o. year old female who is a primary care patient of Trey Sailors, [place]. The CCM team was consulted for assistance with Mental Health Counseling and Resources.   Review of patient status, including review of consultants reports, other relevant assessments, and collaboration with appropriate care team members and the patient's provider was performed as part of comprehensive patient evaluation and provision of chronic care management services.    SDOH (Social Determinants of Health) assessments performed: No    Outpatient Encounter Medications as of 12/27/2019  Medication Sig  . hydrOXYzine (ATARAX/VISTARIL) 25 MG tablet Take 1 tablet (25 mg total) by mouth 3 (three) times daily as needed for anxiety.  Marland Kitchen ibuprofen (ADVIL,MOTRIN) 600 MG tablet Take 1 tablet (600 mg total) by mouth every 6 (six) hours. (Patient not taking: Reported on 03/12/2019)  . levonorgestrel (MIRENA) 20 MCG/24HR IUD 1 each by Intrauterine route once.  . Multiple Vitamins-Minerals (ONE DAILY MULTIVITAMIN ADULT) TABS Take 1 tablet by mouth daily.  . sertraline (ZOLOFT) 50 MG tablet Take 1 tablet (50 mg total) by mouth daily. For depression  . traZODone (DESYREL) 50 MG tablet Take 1 tablet (50 mg total) by mouth at bedtime as needed for sleep.   No facility-administered encounter medications on file as of 12/27/2019.     Goals Addressed            This Visit's Progress   . I really don't like to talk much but I wll try talking to a therapist" (pt-stated)       Current Barriers:  . Chronic Mental Health needs related to anxiety and depression . Mental Health Concerns  . Suicidal Ideation/Homicidal Ideation: No  Clinical Social Work Goal(s):  Marland Kitchen Over the next 90 days, patient will work with SW bi-weekly by telephone or in person to reduce or manage symptoms related to  depression and anxiety . Over the next 90 days, patient will follow up with local mental health therapist* as directed by SW  Interventions: .  Explored patient's status with ongoing follow up with a mental health provider . Patient confirmed contacting a mental health agency, left a message  but they did not call back . Conference call made to [facility], referral completed with patient on the phone. The mental health provider will call patient to schedule initial appointment.    Patient Self Care Activities:  . Performs ADL's independently . Performs IADL's independently  Patient Coping Strengths:  . Supportive Relationships . Able to Communicate Effectively  Patient Self Care Deficits:  . Patient has limited knowledge of local mental health provider  Please see past updates related to this goal by clicking on the "Past Updates" button in the selected goal          Follow Up Plan: SW will follow up with patient by phone over the next 7-14 business days to confirm start of mental health treatment    Verna Czech, LCSW Clinical Social Worker  [facility] Care Management [phone]

## 2019-12-27 NOTE — Patient Instructions (Signed)
Thank you allowing the Chronic Care Management Team to be a part of your care! It was a pleasure speaking with you today!  1. Please follow up with [facility] for ongoing mental health counseling  CCM (Chronic Care Management) Team   Juanell Fairly  RN, BSN Nurse Care Coordinator  [phone]   [place] [address], LCSW Clinical Social Worker [phone]  Goals Addressed            This Visit's Progress   . I really don't like to talk much but I wll try talking to a therapist" (pt-stated)       Current Barriers:  . Chronic Mental Health needs related to anxiety and depression . Mental Health Concerns  . Suicidal Ideation/Homicidal Ideation: No  Clinical Social Work Goal(s):  Marland Kitchen Over the next 90 days, patient will work with SW bi-weekly by telephone or in person to reduce or manage symptoms related to depression and anxiety . Over the next 90 days, patient will follow up with local mental health therapist* as directed by SW  Interventions: .  Explored patient's status with ongoing follow up with a mental health provider . Patient confirmed contacting a mental health agency, left a message  but they did not call back . Conference call made to [facility], referral completed with patient on the phone. The mental health provider will call patient to schedule initial appointment.    Patient Self Care Activities:  . Performs ADL's independently . Performs IADL's independently  Patient Coping Strengths:  . Supportive Relationships . Able to Communicate Effectively  Patient Self Care Deficits:  . Patient has limited knowledge of local mental health provider  Please see past updates related to this goal by clicking on the "Past Updates" button in the selected goal          The patient verbalized understanding of instructions provided today and declined a print copy of patient instruction materials.   Telephone follow up appointment  with care management team member scheduled for: 01/05/20

## 2020-01-05 ENCOUNTER — Ambulatory Visit: Payer: Self-pay | Admitting: *Deleted

## 2020-01-05 DIAGNOSIS — F32A Depression, unspecified: Secondary | ICD-10-CM

## 2020-01-05 DIAGNOSIS — F1010 Alcohol abuse, uncomplicated: Secondary | ICD-10-CM

## 2020-01-05 NOTE — Patient Instructions (Addendum)
Thank you allowing the Chronic Care Management Team to be a part of your care! It was a pleasure speaking with you today!  1. Please follow up with your insurance company for a list of in network mental health foundation.  CCM (Chronic Care Management) Team    Juanell Fairly RN, BSN Nurse Care Coordinator  [phone]  [place], LCSW Clinical Social Worker [phone]  Goals Addressed            This Visit's Progress   . I really don't like to talk much but I wll try talking to a therapist" (pt-stated)       Current Barriers:  . Chronic Mental Health needs related to anxiety and depression . Mental Health Concerns  . Suicidal Ideation/Homicidal Ideation: No  Clinical Social Work Goal(s):  Marland Kitchen Over the next 90 days, patient will work with SW bi-weekly by telephone or in person to reduce or manage symptoms related to depression and anxiety . Over the next 90 days, patient will follow up with local mental health therapist* as directed by SW  Interventions: .  Explored patient's status with ongoing follow up with a mental health provider . Patient confirmed that she has contacted [facility] however they are out of network-appointment not made . Patient would like in network provider with dual diagnosis treatment options . Contact information provided for [facility] (dual diagnosis-[phone]) . Patient agreed to call her insurance company for in network provider list . Social Worker to identify additional resources for mental health follow up . Encouraged patient to follow up with a therapist as soon as possible for ongoing treatment    Patient Self Care Activities:  . Performs ADL's independently . Performs IADL's independently  Patient Coping Strengths:  . Supportive Relationships . Able to Communicate Effectively  Patient Self Care Deficits:  . Patient has limited knowledge of local mental health provider  Please see  past updates related to this goal by clicking on the "Past Updates" button in the selected goal          The patient verbalized understanding of instructions provided today and declined a print copy of patient instruction materials.   Telephone follow up appointment with care management team member scheduled for: 01/12/20

## 2020-01-05 NOTE — Chronic Care Management (AMB) (Signed)
Care Management    Clinical Social Work Follow Up Note  01/05/2020 Name: [name] MRN: [id number] DOB: 1996/03/20  [name] is a 23 y.o. year old female who is a primary care patient of Maryella Shivers. The CCM team was consulted for assistance with Mental Health Counseling and Resources.   Review of patient status, including review of consultants reports, other relevant assessments, and collaboration with appropriate care team members and the patient's provider was performed as part of comprehensive patient evaluation and provision of chronic care management services.    SDOH (Social Determinants of Health) assessments performed: No    Outpatient Encounter Medications as of 01/05/2020  Medication Sig  . hydrOXYzine (ATARAX/VISTARIL) 25 MG tablet Take 1 tablet (25 mg total) by mouth 3 (three) times daily as needed for anxiety.  Marland Kitchen ibuprofen (ADVIL,MOTRIN) 600 MG tablet Take 1 tablet (600 mg total) by mouth every 6 (six) hours. (Patient not taking: Reported on 03/12/2019)  . levonorgestrel (MIRENA) 20 MCG/24HR IUD 1 each by Intrauterine route once.  . Multiple Vitamins-Minerals (ONE DAILY MULTIVITAMIN ADULT) TABS Take 1 tablet by mouth daily.  . sertraline (ZOLOFT) 50 MG tablet Take 1 tablet (50 mg total) by mouth daily. For depression  . traZODone (DESYREL) 50 MG tablet Take 1 tablet (50 mg total) by mouth at bedtime as needed for sleep.   No facility-administered encounter medications on file as of 01/05/2020.     Goals Addressed            This Visit's Progress   . I really don't like to talk much but I wll try talking to a therapist" (pt-stated)       Current Barriers:  . Chronic Mental Health needs related to anxiety and depression . Mental Health Concerns  . Suicidal Ideation/Homicidal Ideation: No  Clinical Social Work Goal(s):  Marland Kitchen Over the next 90 days, patient will work with SW bi-weekly by telephone or in person to reduce or manage symptoms related to  depression and anxiety . Over the next 90 days, patient will follow up with local mental health therapist* as directed by SW  Interventions: .  Explored patient's status with ongoing follow up with a mental health provider . Patient confirmed that she has contacted [facility] however they are out of network-appointment not made . Patient would like in network provider with dual diagnosis treatment options . Contact information provided for [facility] (dual diagnosis-[phone]) . Patient agreed to call her insurance company for in network provider list . Social Worker to identify additional resources for mental health follow up . Encouraged patient to follow up with a therapist as soon as possible for ongoing treatment    Patient Self Care Activities:  . Performs ADL's independently . Performs IADL's independently  Patient Coping Strengths:  . Supportive Relationships . Able to Communicate Effectively  Patient Self Care Deficits:  . Patient has limited knowledge of local mental health provider  Please see past updates related to this goal by clicking on the "Past Updates" button in the selected goal          Follow Up Plan: SW will follow up with patient by phone over the next 7-10 business days    [place], [place] Clinical Social Worker  [facility] Care Management [phone]

## 2020-01-07 ENCOUNTER — Other Ambulatory Visit: Payer: Self-pay

## 2020-01-07 ENCOUNTER — Telehealth: Payer: Self-pay | Admitting: Physician Assistant

## 2020-01-07 ENCOUNTER — Encounter: Payer: Self-pay | Admitting: Physician Assistant

## 2020-01-07 ENCOUNTER — Ambulatory Visit (INDEPENDENT_AMBULATORY_CARE_PROVIDER_SITE_OTHER): Payer: PRIVATE HEALTH INSURANCE | Admitting: Physician Assistant

## 2020-01-07 VITALS — BP 118/74 | HR 79 | Temp 97.1°F | Wt 131.6 lb

## 2020-01-07 DIAGNOSIS — F1010 Alcohol abuse, uncomplicated: Secondary | ICD-10-CM | POA: Diagnosis not present

## 2020-01-07 DIAGNOSIS — IMO0002 Reserved for concepts with insufficient information to code with codable children: Secondary | ICD-10-CM | POA: Insufficient documentation

## 2020-01-07 DIAGNOSIS — T71162S Asphyxiation due to hanging, intentional self-harm, sequela: Secondary | ICD-10-CM | POA: Diagnosis not present

## 2020-01-07 DIAGNOSIS — F332 Major depressive disorder, recurrent severe without psychotic features: Secondary | ICD-10-CM

## 2020-01-07 DIAGNOSIS — Z915 Personal history of self-harm: Secondary | ICD-10-CM

## 2020-01-07 DIAGNOSIS — T71162A Asphyxiation due to hanging, intentional self-harm, initial encounter: Secondary | ICD-10-CM | POA: Insufficient documentation

## 2020-01-07 MED ORDER — SERTRALINE HCL 50 MG PO TABS: 50.0000 mg | ORAL_TABLET | Freq: Every day | ORAL | 1 refills | Status: DC
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
## 2020-01-07 NOTE — Telephone Encounter (Signed)
Patient returning call from triage. Wanted to inform them that she is completely out. Please advise.

## 2020-01-07 NOTE — Telephone Encounter (Signed)
sertraline (ZOLOFT) 50 MG tablet     Patient requesting refill. Patient received this medication from hospital and inquired if Ricki Rodriguez would be willing to manage it.     Pharmacy:  [id number] - [place], [place] - [address] AT [facility] [address] Phone:  [phone]  Fax:  [phone]

## 2020-01-07 NOTE — Telephone Encounter (Signed)
Patient came in office was refill.

## 2020-01-07 NOTE — Telephone Encounter (Signed)
Called pt. Back and left a message. Need to know if she is out of medication and/or how much she has left.

## 2020-01-07 NOTE — Telephone Encounter (Signed)
Call to patient- discussed medication and how it is helping her- advised she will need follow up with PCP if this is a medication she wants her PCP to prescribe.They will need to discuss if this is something PCP wants to follow- she is fine with that plan. Appointment scheduled.

## 2020-01-11 ENCOUNTER — Other Ambulatory Visit: Payer: Self-pay | Admitting: Physician Assistant

## 2020-01-11 DIAGNOSIS — F332 Major depressive disorder, recurrent severe without psychotic features: Secondary | ICD-10-CM

## 2020-01-11 NOTE — Telephone Encounter (Signed)
RX REFILL sertraline (ZOLOFT) 50 MG tablet [[id number] PHARMACY [id number] - [place], [place] - [address] AT [facility] [address] [facility] [address]  [address] [place], [place] [address]  Phone:  [phone] Fax:  [phone]   Patient states she called pharmacy and they did not have prescription.  Patient would like it to be resent

## 2020-01-12 ENCOUNTER — Telehealth: Payer: Self-pay | Admitting: *Deleted

## 2020-01-12 ENCOUNTER — Ambulatory Visit: Payer: Self-pay | Admitting: *Deleted

## 2020-01-12 NOTE — Chronic Care Management (AMB) (Signed)
   Care Management   Unsuccessful Call Note 01/12/2020 Name: [name]: [id number] DOB: 1996-04-04  Patient is a 24 year old female who sees Nettie Elm, [place] for primary care. Osvaldo Angst, PA-C asked the CCM team to consult the patient for Mental Health Counseling and Resources.     This social worker was unable to reach patient via telephone today for follow up call. I have left HIPAA compliant voicemail asking patient to return my call. (unsuccessful outreach #1).   Plan: Will follow-up within 7 business days via telephone.      Verna Czech, LCSW Clinical Social Worker  [facility] Care Management [phone]

## 2020-01-14 ENCOUNTER — Other Ambulatory Visit: Payer: Self-pay | Admitting: Physician Assistant

## 2020-01-14 DIAGNOSIS — F332 Major depressive disorder, recurrent severe without psychotic features: Secondary | ICD-10-CM

## 2020-01-14 NOTE — Telephone Encounter (Signed)
Refill refused. 60 day supply given on 5/14/1 #30 with 1 refill

## 2020-01-17 NOTE — Telephone Encounter (Signed)
Patient is calling again regarding a refill for her medication.  She does not understand why it was refused.  Please advise and call patient to explain.  CB# [phone]

## 2020-01-17 NOTE — Telephone Encounter (Signed)
Patient was advised that the order was printed and a message will be send to Adriana to refill the medication. Please advise.

## 2020-01-17 NOTE — Addendum Note (Signed)
Addended by: Trey Sailors on: 01/17/2020 05:07 PM   Modules accepted: Orders

## 2020-01-17 NOTE — Addendum Note (Signed)
Addended by: Cheron Every C on: 01/17/2020 05:01 PM   Modules accepted: Orders

## 2020-01-17 NOTE — Telephone Encounter (Signed)
I have declined this refill. It is too early for a refill. Additionally, I sent in 30 days with one refill, so she already has a refill. I was clear with patient in the office that this is all the medication that I am giving because she is going to need to see a psychiatrist to manage this. Psychiatry has made contact with her but she was driving on 5/69/7948 so she needs to call them back.

## 2020-01-19 ENCOUNTER — Ambulatory Visit: Payer: PRIVATE HEALTH INSURANCE | Admitting: *Deleted

## 2020-01-19 DIAGNOSIS — F1010 Alcohol abuse, uncomplicated: Secondary | ICD-10-CM

## 2020-01-19 DIAGNOSIS — F332 Major depressive disorder, recurrent severe without psychotic features: Secondary | ICD-10-CM

## 2020-01-19 NOTE — Telephone Encounter (Signed)
Pt called in again about her Setraline for 50 mg . Pt wants to know the status of her rx. .Best callback number : [phone]

## 2020-01-19 NOTE — Telephone Encounter (Signed)
Patient advised. She states she has contacted psychiatrist and left a message. She states pharmacy does npot have her refill on file. She will try to call they back to be sure that is correct.

## 2020-01-20 ENCOUNTER — Telehealth: Payer: Self-pay | Admitting: Physician Assistant

## 2020-01-20 DIAGNOSIS — F332 Major depressive disorder, recurrent severe without psychotic features: Secondary | ICD-10-CM

## 2020-01-20 MED ORDER — SERTRALINE HCL 50 MG PO TABS: 50.0000 mg | ORAL_TABLET | Freq: Every day | ORAL | 1 refills | Status: DC

## 2020-01-20 NOTE — Patient Instructions (Signed)
Thank you allowing the Chronic Care Management Team to be a part of your care! It was a pleasure speaking with you today!  1. Please follow up with [facility] for ongoing mental health follow up  CCM (Chronic Care Management) Team   Juanell Fairly RN, BSN Nurse Care Coordinator  [phone]   Raunel Dimartino [address], LCSW Clinical Social Worker [phone]  Goals Addressed            This Visit's Progress   . I really don't like to talk much but I wll try talking to a therapist" (pt-stated)       Current Barriers:  . Chronic Mental Health needs related to anxiety and depression . Mental Health Concerns  . Suicidal Ideation/Homicidal Ideation: No  Clinical Social Work Goal(s):  Marland Kitchen Over the next 90 days, patient will work with SW bi-weekly by telephone or in person to reduce or manage symptoms related to depression and anxiety . Over the next 90 days, patient will follow up with local mental health therapist* as directed by SW  Interventions: .  Explored patient's status with ongoing follow up with a mental health provider . Patient confirmed that she has contacted [facility] for potential (dual diagnosis) follow up, she is waiting in a return call . Patient  . Contact information provided for [facility] (dual diagnosis-[phone]) . Patient agreed to call her insurance company for in network provider list including confirmation that the [facility] would be in [place] . Positive reinforcement provided for contacts made to coordinate ongoing mental health follow up and verbalizing use of positive coping skills . Encouraged patient to follow up with a therapist as soon as possible for ongoing treatment    Patient Self Care Activities:  . Performs ADL's independently . Performs IADL's independently  Patient Coping Strengths:  . Supportive Relationships . Able to Communicate Effectively  Patient Self Care Deficits:   . Patient has limited knowledge of local mental health provider  Please see past updates related to this goal by clicking on the "Past Updates" button in the selected goal          The patient verbalized understanding of instructions provided today and declined a print copy of patient instruction materials.   Telephone follow up appointment with care management team member scheduled for: 02/04/20

## 2020-01-20 NOTE — Telephone Encounter (Signed)
Patient was advised.  

## 2020-01-20 NOTE — Chronic Care Management (AMB) (Signed)
Care Management    Clinical Social Work Follow Up Note  01/20/2020 Name: [name] MRN: [id number] DOB: [birth year]  [name] is a 24 y.o. year old female who is a primary care patient of Trey Sailors, [place]. The CCM team was consulted for assistance with Mental Health Counseling and Resources.   Review of patient status, including review of consultants reports, other relevant assessments, and collaboration with appropriate care team members and the patient's provider was performed as part of comprehensive patient evaluation and provision of chronic care management services.    SDOH (Social Determinants of Health) assessments performed: No    Outpatient Encounter Medications as of 01/19/2020  Medication Sig  . hydrOXYzine (ATARAX/VISTARIL) 25 MG tablet Take 1 tablet (25 mg total) by mouth 3 (three) times daily as needed for anxiety.  Marland Kitchen ibuprofen (ADVIL,MOTRIN) 600 MG tablet Take 1 tablet (600 mg total) by mouth every 6 (six) hours. (Patient not taking: Reported on 03/12/2019)  . levonorgestrel (MIRENA) 20 MCG/24HR IUD 1 each by Intrauterine route once.  . Multiple Vitamins-Minerals (ONE DAILY MULTIVITAMIN ADULT) TABS Take 1 tablet by mouth daily.  . sertraline (ZOLOFT) 50 MG tablet Take 1 tablet (50 mg total) by mouth daily. For depression  . traZODone (DESYREL) 50 MG tablet Take 1 tablet (50 mg total) by mouth at bedtime as needed for sleep.   No facility-administered encounter medications on file as of 01/19/2020.     Goals Addressed            This Visit's Progress   . I really don't like to talk much but I wll try talking to a therapist" (pt-stated)       Current Barriers:  . Chronic Mental Health needs related to anxiety and depression . Mental Health Concerns  . Suicidal Ideation/Homicidal Ideation: No  Clinical Social Work Goal(s):  Marland Kitchen Over the next 90 days, patient will work with SW bi-weekly by telephone or in person to reduce or manage symptoms related to  depression and anxiety . Over the next 90 days, patient will follow up with local mental health therapist* as directed by SW  Interventions: .  Explored patient's status with ongoing follow up with a mental health provider . Patient confirmed that she has contacted [facility] for potential (dual diagnosis) follow up, she is waiting in a return call . Patient  . Contact information provided for [facility] (dual [phone]) . Patient agreed to call her insurance company for in network provider list including confirmation that the [facility] would be in [place] . Positive reinforcement provided for contacts made to coordinate ongoing mental health follow up and verbalizing use of positive coping skills . Encouraged patient to follow up with a therapist as soon as possible for ongoing treatment    Patient Self Care Activities:  . Performs ADL's independently . Performs IADL's independently  Patient Coping Strengths:  . Supportive Relationships . Able to Communicate Effectively  Patient Self Care Deficits:  . Patient has limited knowledge of local mental health provider  Please see past updates related to this goal by clicking on the "Past Updates" button in the selected goal          Follow Up Plan: SW will follow up with patient by phone over the next 7-14 business days    [place], [place] Clinical Social Worker  [facility] Care Management [phone]

## 2020-01-20 NOTE — Telephone Encounter (Signed)
Called pharmacist and confirmed they did not receive medicine. Resent electronically and also gave verbal order for zoloft 50 mg QD # 30 with one refill.

## 2020-01-21 ENCOUNTER — Ambulatory Visit: Payer: Self-pay

## 2020-01-21 ENCOUNTER — Ambulatory Visit: Payer: 59 | Attending: Internal Medicine

## 2020-01-21 DIAGNOSIS — Z23 Encounter for immunization: Secondary | ICD-10-CM

## 2020-01-21 NOTE — Progress Notes (Signed)
   Covid-19 Vaccination Clinic  Name:  [name]    MRN: [id number] DOB: 07/08/1996  01/21/2020  [name] was observed post Covid-19 immunization for 15 minutes without incident. She was provided with Vaccine Information Sheet and instruction to access the V-Safe system.   [name] was instructed to call 911 with any severe reactions post vaccine: Marland Kitchen Difficulty breathing  . Swelling of face and throat  . A fast heartbeat  . A bad rash all over body  . Dizziness and weakness   Immunizations Administered    Name Date Dose VIS Date Route   Pfizer COVID-19 Vaccine 01/21/2020  9:21 AM 0.3 mL 10/20/2018 Intramuscular   Manufacturer: [employer], [employer]   Lot: [id number]   NDC: [address]-5

## 2020-01-27 ENCOUNTER — Encounter: Payer: Self-pay | Admitting: Emergency Medicine

## 2020-01-27 ENCOUNTER — Other Ambulatory Visit: Payer: Self-pay

## 2020-01-27 DIAGNOSIS — K2900 Acute gastritis without bleeding: Secondary | ICD-10-CM | POA: Diagnosis not present

## 2020-01-27 DIAGNOSIS — Z79899 Other long term (current) drug therapy: Secondary | ICD-10-CM | POA: Diagnosis not present

## 2020-01-27 DIAGNOSIS — Z91010 Allergy to peanuts: Secondary | ICD-10-CM | POA: Insufficient documentation

## 2020-01-27 DIAGNOSIS — F1011 Alcohol abuse, in remission: Secondary | ICD-10-CM | POA: Insufficient documentation

## 2020-01-27 DIAGNOSIS — K5900 Constipation, unspecified: Secondary | ICD-10-CM | POA: Insufficient documentation

## 2020-01-27 DIAGNOSIS — R1012 Left upper quadrant pain: Secondary | ICD-10-CM | POA: Diagnosis present

## 2020-01-27 LAB — COMPREHENSIVE METABOLIC PANEL
ALT: 16 U/L (ref 0–44)
AST: 16 U/L (ref 15–41)
Albumin: 4.7 g/dL (ref 3.5–5.0)
Alkaline Phosphatase: 67 U/L (ref 38–126)
Anion gap: 6 (ref 5–15)
BUN: 12 mg/dL (ref 6–20)
CO2: 29 mmol/L (ref 22–32)
Calcium: 9.7 mg/dL (ref 8.9–10.3)
Chloride: 103 mmol/L (ref 98–111)
Creatinine, Ser: 0.86 mg/dL (ref 0.44–1.00)
GFR calc Af Amer: >60 mL/min (ref >60)
GFR calc non Af Amer: >60 mL/min (ref >60)
Glucose, Bld: 96 mg/dL (ref 70–99)
Potassium: 3.3 mmol/L — ABNORMAL LOW (ref 3.5–5.1)
Sodium: 138 mmol/L (ref 135–145)
Total Bilirubin: 0.8 mg/dL (ref 0.3–1.2)
Total Protein: 8.1 g/dL (ref 6.5–8.1)

## 2020-01-27 LAB — CBC WITH DIFFERENTIAL/PLATELET
Abs Immature Granulocytes: 0.01 10*3/uL (ref 0.00–0.07)
Basophils Absolute: 0 10*3/uL (ref 0.0–0.1)
Basophils Relative: 1 %
Eosinophils Absolute: 0.3 10*3/uL (ref 0.0–0.5)
Eosinophils Relative: 4 %
HCT: 40.7 % (ref 36.0–46.0)
Hemoglobin: 13.4 g/dL (ref 12.0–15.0)
Immature Granulocytes: 0 %
Lymphocytes Relative: 34 %
Lymphs Abs: 2 10*3/uL (ref 0.7–4.0)
MCH: 27.7 pg (ref 26.0–34.0)
MCHC: 32.9 g/dL (ref 30.0–36.0)
MCV: 84.1 fL (ref 80.0–100.0)
Monocytes Absolute: 0.4 10*3/uL (ref 0.1–1.0)
Monocytes Relative: 7 %
Neutro Abs: 3.3 10*3/uL (ref 1.7–7.7)
Neutrophils Relative %: 54 %
Platelets: 206 10*3/uL (ref 150–400)
RBC: 4.84 MIL/uL (ref 3.87–5.11)
RDW: 12.5 % (ref 11.5–15.5)
WBC: 6 10*3/uL (ref 4.0–10.5)
nRBC: 0 % (ref 0.0–0.2)

## 2020-01-27 LAB — LIPASE, BLOOD: Lipase: 34 U/L (ref 11–51)

## 2020-01-27 NOTE — ED Triage Notes (Addendum)
Patient ambulatory to triage with steady gait, without difficulty or distress noted; pt reports x 2wks having left sided abd pain accomp by diarrhea and nausea; sent over from urgent care for eval; pt with hx past ETOH use

## 2020-01-28 ENCOUNTER — Emergency Department
Admission: EM | Admit: 2020-01-28 | Discharge: 2020-01-28 | Disposition: A | Discharge status: Home / Self Care | Payer: 59 | Attending: Emergency Medicine | Admitting: Emergency Medicine

## 2020-01-28 ENCOUNTER — Emergency Department: Payer: 59

## 2020-01-28 DIAGNOSIS — R1084 Generalized abdominal pain: Secondary | ICD-10-CM

## 2020-01-28 DIAGNOSIS — K5900 Constipation, unspecified: Secondary | ICD-10-CM

## 2020-01-28 DIAGNOSIS — K2900 Acute gastritis without bleeding: Secondary | ICD-10-CM

## 2020-01-28 LAB — URINALYSIS, COMPLETE (UACMP) WITH MICROSCOPIC
Bacteria, UA: NONE SEEN
Bilirubin Urine: NEGATIVE
Glucose, UA: NEGATIVE mg/dL
Ketones, ur: NEGATIVE mg/dL
Leukocytes,Ua: NEGATIVE
Nitrite: NEGATIVE
Protein, ur: NEGATIVE mg/dL
Specific Gravity, Urine: 1.012 (ref 1.005–1.030)
pH: 5 (ref 5.0–8.0)

## 2020-01-28 LAB — POCT PREGNANCY, URINE: Preg Test, Ur: NEGATIVE

## 2020-01-28 MED ORDER — FAMOTIDINE IN NACL 20-0.9 MG/50ML-% IV SOLN
20.0000 mg | Freq: Once | INTRAVENOUS | Status: AC
  Administered 2020-01-28: 20 mg via INTRAVENOUS
  Filled 2020-01-28: qty 50

## 2020-01-28 MED ORDER — FAMOTIDINE 20 MG PO TABS: 20.0000 mg | ORAL_TABLET | Freq: Two times a day (BID) | ORAL | 0 refills | Status: AC

## 2020-01-28 MED ORDER — IOHEXOL 300 MG/ML  SOLN
100.0000 mL | Freq: Once | INTRAMUSCULAR | Status: AC | PRN
  Administered 2020-01-28: 100 mL via INTRAVENOUS

## 2020-01-28 MED ORDER — SERTRALINE HCL 50 MG PO TABS
50.0000 mg | ORAL_TABLET | Freq: Once | ORAL | Status: AC
  Administered 2020-01-28: 50 mg via ORAL
  Filled 2020-01-28: qty 1

## 2020-01-28 MED ORDER — SUCRALFATE 1 GM/10ML PO SUSP: 1.0000 g | Freq: Four times a day (QID) | ORAL | 1 refills | Status: AC

## 2020-01-28 MED ORDER — IOHEXOL 9 MG/ML PO SOLN
500.0000 mL | Freq: Once | ORAL | Status: AC | PRN
  Administered 2020-01-28 (×2): 500 mL via ORAL

## 2020-01-28 MED ORDER — FENTANYL CITRATE (PF) 100 MCG/2ML IJ SOLN
50.0000 ug | Freq: Once | INTRAMUSCULAR | Status: AC
  Administered 2020-01-28: 50 ug via INTRAVENOUS
  Filled 2020-01-28: qty 2

## 2020-01-28 MED ORDER — SODIUM CHLORIDE 0.9 % IV BOLUS
1000.0000 mL | Freq: Once | INTRAVENOUS | Status: AC
  Administered 2020-01-28: 1000 mL via INTRAVENOUS

## 2020-01-28 MED ORDER — ONDANSETRON HCL 4 MG/2ML IJ SOLN
4.0000 mg | Freq: Once | INTRAMUSCULAR | Status: AC
  Administered 2020-01-28: 4 mg via INTRAVENOUS
  Filled 2020-01-28: qty 2

## 2020-01-28 MED ORDER — LACTULOSE 10 GM/15ML PO SOLN: 20.0000 g | Freq: Every day | ORAL | 0 refills | Status: AC | PRN

## 2020-01-28 NOTE — Discharge Instructions (Addendum)
1.  Start these following medicines daily: Pepcid 20 mg twice daily Carafate 10 mL four times daily 2.  Take Lactulose as needed for bowel movements. 3.  Return to the ER for worsening symptoms, persistent vomiting, difficulty breathing or other concerns.

## 2020-01-28 NOTE — ED Notes (Signed)
Pt returned to ED Rm 19 from CT at this time. 

## 2020-01-28 NOTE — ED Provider Notes (Signed)
[facility] Emergency Department Provider Note   ____________________________________________   First MD Initiated Contact with Patient 01/28/20 0106     (approximate)  I have reviewed the triage vital signs and the nursing notes.   HISTORY  Chief Complaint Abdominal Pain    HPI [name] is a 24 y.o. female referred to the ED by urgent care for evaluation of left-sided abdominal pain.  Patient with a history of EtOH abuse who has been sober for 1 month.  Reports a 2-week history of left upper quadrant abdominal pain which she describes as nonradiating and burning.  Symptoms accompanied by nausea and diarrhea 2 days ago.  States her OTC acid reducers are not working.  Denies fever, cough, chest pain, shortness of breath, vomiting, dysuria.  Denies recent travel or trauma.       Past Medical History:  Diagnosis Date  . Anxiety   . Depression   . Heart murmur   . Sickle cell trait [facility])     Patient Active Problem List   Diagnosis Date Noted  . Alcohol abuse 01/07/2020  . Suicide attempt by hanging (HCC) 01/07/2020  . H/O self-harm 01/07/2020  . Severe recurrent major depression without psychotic features (HCC) 11/30/2019  . Encounter for planned induction of labor 11/06/2018  . Anemia affecting pregnancy, antepartum 10/02/2018  . Circumvallate placenta, unspecified trimester 09/04/2018  . Depression 04/17/2018  . Supervision of other normal pregnancy, antepartum 03/25/2018    Past Surgical History:  Procedure Laterality Date  . NO PAST SURGERIES      Prior to Admission medications   Medication Sig Start Date End Date Taking? Authorizing Provider  famotidine (PEPCID) 20 MG tablet Take 1 tablet (20 mg total) by mouth 2 (two) times daily. 01/28/20   Irean Hong, MD  hydrOXYzine (ATARAX/VISTARIL) 25 MG tablet Take 1 tablet (25 mg total) by mouth 3 (three) times daily as needed for anxiety. 12/03/19   Armandina Stammer I, NP  ibuprofen  (ADVIL,MOTRIN) 600 MG tablet Take 1 tablet (600 mg total) by mouth every 6 (six) hours. Patient not taking: Reported on 03/12/2019 11/07/18   Vena Austria, MD  lactulose (CHRONULAC) 10 GM/15ML solution Take 30 mLs (20 g total) by mouth daily as needed for mild constipation. 01/28/20   Irean Hong, MD  levonorgestrel (MIRENA) 20 MCG/24HR IUD 1 each by Intrauterine route once.    [provider]  Multiple Vitamins-Minerals (ONE DAILY MULTIVITAMIN ADULT) TABS Take 1 tablet by mouth daily.    [provider]  sertraline (ZOLOFT) 50 MG tablet Take 1 tablet (50 mg total) by mouth daily. For depression 01/20/20   Trey Sailors, PA-C  sucralfate (CARAFATE) 1 GM/10ML suspension Take 10 mLs (1 g total) by mouth 4 (four) times daily. 01/28/20   Irean Hong, MD  traZODone (DESYREL) 50 MG tablet Take 1 tablet (50 mg total) by mouth at bedtime as needed for sleep. 12/03/19   Armandina Stammer I, NP    Allergies Peanuts [peanut oil] and Mushroom extract complex  Family History  Problem Relation Age of Onset  . Hypertension Maternal Grandmother   . Healthy Mother   . Healthy Father     Social History Social History   Tobacco Use  . Smoking status: Never Smoker  . Smokeless tobacco: Never Used  Substance Use Topics  . Alcohol use: Not Currently    Comment: 11/29/2019: drank 1.5 bottles of wine and 1/2 bottle of vodka mixed withj juidce  . Drug  use: No    Review of Systems  Constitutional: No fever/chills Eyes: No visual changes. ENT: No sore throat. Cardiovascular: Denies chest pain. Respiratory: Denies shortness of breath. Gastrointestinal: Positive for abdominal pain and nausea, no vomiting.  Positive for diarrhea.  No constipation. Genitourinary: Negative for dysuria. Musculoskeletal: Negative for back pain. Skin: Negative for rash. Neurological: Negative for headaches, focal weakness or numbness.   ____________________________________________   PHYSICAL  EXAM:  VITAL SIGNS: ED Triage Vitals  Enc Vitals Group     BP 01/27/20 2229 122/60     Pulse Rate 01/27/20 2229 70     Resp 01/27/20 2229 20     Temp 01/27/20 2229 99.3 F (37.4 C)     Temp Source 01/27/20 2229 Oral     SpO2 01/27/20 2229 100 %     Weight 01/27/20 2043 133 lb (60.3 kg)     Height 01/27/20 2043 5\' 10"  (1.778 m)     Head Circumference --      Peak Flow --      Pain Score 01/27/20 2044 7     Pain Loc --      Pain Edu? --      Excl. in GC? --     Constitutional: Alert and oriented. Well appearing and in mild acute distress. Eyes: Conjunctivae are normal. PERRL. EOMI. Head: Atraumatic. Nose: No congestion/rhinnorhea. Mouth/Throat: Mucous membranes are moist.  Oropharynx non-erythematous. Neck: No stridor.   Cardiovascular: Normal rate, regular rhythm. Grossly normal heart sounds.  Good peripheral circulation. Respiratory: Normal respiratory effort.  No retractions. Lungs CTAB. Gastrointestinal: Soft and diffusely tender to palpation particularly left upper, mid and lateral quadrants with voluntary guarding, no rebound. No distention. No abdominal bruits. No CVA tenderness. Musculoskeletal: No lower extremity tenderness nor edema.  No joint effusions. Neurologic:  Normal speech and language. No gross focal neurologic deficits are appreciated. No gait instability. Skin:  Skin is warm, dry and intact. No rash noted. Psychiatric: Mood and affect are normal. Speech and behavior are normal.  ____________________________________________   LABS (all labs ordered are listed, but only abnormal results are displayed)  Labs Reviewed  COMPREHENSIVE METABOLIC PANEL - Abnormal; Notable for the following components:      Result Value   Potassium 3.3 (*)    All other components within normal limits  URINALYSIS, COMPLETE (UACMP) WITH MICROSCOPIC - Abnormal; Notable for the following components:   Color, Urine YELLOW (*)    APPearance HAZY (*)    Hgb urine dipstick LARGE (*)     All other components within normal limits  CBC WITH DIFFERENTIAL/PLATELET  LIPASE, BLOOD  POCT PREGNANCY, URINE   ____________________________________________  EKG  None ____________________________________________  RADIOLOGY  ED MD interpretation: No acute pathology; moderate to large colonic stool without obstruction  Official radiology report(s): CT Abdomen Pelvis W Contrast  Result Date: 01/28/2020 CLINICAL DATA:  Left-sided abdominal pain EXAM: CT ABDOMEN AND PELVIS WITH CONTRAST TECHNIQUE: Multidetector CT imaging of the abdomen and pelvis was performed using the standard protocol following bolus administration of intravenous contrast. CONTRAST:  03/29/2020 OMNIPAQUE IOHEXOL 300 MG/ML  SOLN COMPARISON:  None. FINDINGS: Lower chest: The visualized heart size within normal limits. No pericardial fluid/thickening. No hiatal hernia. The visualized portions of the lungs are clear. Hepatobiliary: The liver is normal in density without focal abnormality.The main portal vein is patent. No evidence of calcified gallstones, gallbladder wall thickening or biliary dilatation. Pancreas: Unremarkable. No pancreatic ductal dilatation or surrounding inflammatory changes. Spleen: Normal in size without focal  abnormality. Adrenals/Urinary Tract: Both adrenal glands appear normal. The kidneys and collecting system appear normal without evidence of urinary tract calculus or hydronephrosis. Bladder is unremarkable. Stomach/Bowel: The stomach, small bowel, and colon are normal in appearance. No inflammatory changes, wall thickening, or obstructive findings. A moderate large amount of colonic stool is present. The appendix is unremarkable. Vascular/Lymphatic: There are no enlarged mesenteric, retroperitoneal, or pelvic lymph nodes. No significant vascular findings are present. Reproductive: IUD seen within the endometrial canal. Other: No evidence of abdominal wall mass or hernia. Musculoskeletal: No acute or  significant osseous findings. IMPRESSION: No acute intra-abdominal or pelvic pathology to explain the patient's symptoms. Moderate to large amount of colonic stool without evidence of obstruction. Electronically Signed   By: Jonna Clark M.D.   On: 01/28/2020 03:39    ____________________________________________   PROCEDURES  Procedure(s) performed (including Critical Care):  Procedures   ____________________________________________   INITIAL IMPRESSION / ASSESSMENT AND PLAN / ED COURSE  As part of my medical decision making, I reviewed the following data within the electronic MEDICAL RECORD NUMBER Nursing notes reviewed and incorporated, Labs reviewed, Old chart reviewed, Radiograph reviewed and Notes from prior ED visits     [name] was evaluated in Emergency Department on 01/28/2020 for the symptoms described in the history of present illness. She was evaluated in the context of the global COVID-19 pandemic, which necessitated consideration that the patient might be at risk for infection with the SARS-CoV-2 virus that causes COVID-19. Institutional protocols and algorithms that pertain to the evaluation of patients at risk for COVID-19 are in a state of rapid change based on information released by regulatory bodies including the CDC and federal and state organizations. These policies and algorithms were followed during the patient's care in the ED.    24 year old female referred from urgent care for further evaluation of left-sided abdominal pain. Differential diagnosis includes, but is not limited to, biliary disease (biliary colic, acute cholecystitis, cholangitis, choledocholithiasis, etc), intrathoracic causes for epigastric abdominal pain including ACS, gastritis, duodenitis, pancreatitis, small bowel or large bowel obstruction, abdominal aortic aneurysm, hernia, and ulcer(s).  Laboratory results unremarkable.  Given patient's diffuse tenderness on examination, will proceed with  CT abdomen/pelvis.  Initiate IV fluid resuscitation, IV Pepcid for burning sensation, IV fentanyl for pain, IV Zofran for nausea.  Patient missed tonight's dose of Zoloft; will give in the ED.  Clinical Course as of Jan 28 555  Fri Jan 28, 2020  0405 Patient resting in no acute distress; overall feeling better.  Updated her and family member of CT imaging result.  Will discharge home on Pepcid, Carafate and Lactulose to use as needed.  Strict return precautions given.  Patient verbalizes understanding and agrees with plan of care.   [JS]    Clinical Course User Index [JS] Irean Hong, MD     ____________________________________________   FINAL CLINICAL IMPRESSION(S) / ED DIAGNOSES  Final diagnoses:  Generalized abdominal pain  Acute gastritis without hemorrhage, unspecified gastritis type  Constipation, unspecified constipation type     ED Discharge Orders         Ordered    famotidine (PEPCID) 20 MG tablet  2 times daily     01/28/20 0408    sucralfate (CARAFATE) 1 GM/10ML suspension  4 times daily     01/28/20 0408    lactulose (CHRONULAC) 10 GM/15ML solution  Daily PRN     01/28/20 0408           Note:  This  document was prepared using Systems analyst and may include unintentional dictation errors.   Paulette Blanch, MD 01/28/20 [phone]

## 2020-02-04 ENCOUNTER — Ambulatory Visit: Payer: PRIVATE HEALTH INSURANCE | Admitting: *Deleted

## 2020-02-04 DIAGNOSIS — F332 Major depressive disorder, recurrent severe without psychotic features: Secondary | ICD-10-CM

## 2020-02-04 DIAGNOSIS — F1010 Alcohol abuse, uncomplicated: Secondary | ICD-10-CM

## 2020-02-04 NOTE — Chronic Care Management (AMB) (Signed)
Care Management    Clinical Social Work Follow Up Note  02/04/2020 Name: [name] MRN: [id number] DOB: 1995-09-18  [name] is a 24 y.o. year old female who is a primary care patient of Paulene Floor. The CCM team was consulted for assistance with Mental Health Counseling and Resources.   Review of patient status, including review of consultants reports, other relevant assessments, and collaboration with appropriate care team members and the patient's provider was performed as part of comprehensive patient evaluation and provision of chronic care management services.    SDOH (Social Determinants of Health) assessments performed: No    Outpatient Encounter Medications as of 02/04/2020  Medication Sig  . famotidine (PEPCID) 20 MG tablet Take 1 tablet (20 mg total) by mouth 2 (two) times daily.  . hydrOXYzine (ATARAX/VISTARIL) 25 MG tablet Take 1 tablet (25 mg total) by mouth 3 (three) times daily as needed for anxiety.  Marland Kitchen ibuprofen (ADVIL,MOTRIN) 600 MG tablet Take 1 tablet (600 mg total) by mouth every 6 (six) hours. (Patient not taking: Reported on 03/12/2019)  . lactulose (CHRONULAC) 10 GM/15ML solution Take 30 mLs (20 g total) by mouth daily as needed for mild constipation.  Marland Kitchen levonorgestrel (MIRENA) 20 MCG/24HR IUD 1 each by Intrauterine route once.  . Multiple Vitamins-Minerals (ONE DAILY MULTIVITAMIN ADULT) TABS Take 1 tablet by mouth daily.  . sertraline (ZOLOFT) 50 MG tablet Take 1 tablet (50 mg total) by mouth daily. For depression  . sucralfate (CARAFATE) 1 GM/10ML suspension Take 10 mLs (1 g total) by mouth 4 (four) times daily.  . traZODone (DESYREL) 50 MG tablet Take 1 tablet (50 mg total) by mouth at bedtime as needed for sleep.   No facility-administered encounter medications on file as of 02/04/2020.     Goals Addressed              This Visit's Progress   .  I really don't like to talk much but I wll try talking to a therapist" (pt-stated)         Current Barriers:  . Chronic Mental Health needs related to anxiety and depression . Mental Health Concerns  . Suicidal Ideation/Homicidal Ideation: No  Clinical Social Work Goal(s):  Marland Kitchen Over the next 90 days, patient will work with SW bi-weekly by telephone or in person to reduce or manage symptoms related to depression and anxiety . Over the next 90 days, patient will follow up with local mental health therapist* as directed by SW  Interventions: .  Explored patient's status with ongoing follow up with a mental health provider . Patient confirmed that things were going "well" for her and that she had an appointment with the [facility] on 04/02/20 at 3:00pm . Patient further confirmed filling her prescription . Positive reinforcement provided for contacts made to coordinate ongoing mental health follow up and verbalizing use of positive coping skills . Encouraged patient to follow up with a therapist as soon as possible for ongoing treatment    Patient Self Care Activities:  . Performs ADL's independently . Performs IADL's independently  Patient Coping Strengths:  . Supportive Relationships . Able to Communicate Effectively  Patient Self Care Deficits:  . Patient has limited knowledge of local mental health provider  Please see past updates related to this goal by clicking on the "Past Updates" button in the selected goal          Follow Up Plan: Client will follow up with the [facility] for  mental health follow up.  Patient to call this social worker with any further questions or concerns regarding any additional community resource needs    [employer], [place] Clinical Social Worker  [facility] Care Management [phone]

## 2020-02-04 NOTE — Patient Instructions (Addendum)
Thank you allowing the Chronic Care Management Team to be a part of your care! It was a pleasure speaking with you today!  1. Please follow up with the [facility] on 04/03/20 2. Please call this social worker with any additional community resource needs.  CCM (Chronic Care Management) Team   Juanell Fairly  RN, BSN Nurse Care Coordinator  [phone]  [place], LCSW Clinical Social Worker [phone]  Goals Addressed              This Visit's Progress   .  I really don't like to talk much but I wll try talking to a therapist" (pt-stated)        Current Barriers:  . Chronic Mental Health needs related to anxiety and depression . Mental Health Concerns  . Suicidal Ideation/Homicidal Ideation: No  Clinical Social Work Goal(s):  Marland Kitchen Over the next 90 days, patient will work with SW bi-weekly by telephone or in person to reduce or manage symptoms related to depression and anxiety . Over the next 90 days, patient will follow up with local mental health therapist* as directed by SW  Interventions: .  Explored patient's status with ongoing follow up with a mental health provider . Patient confirmed that things were going "well" for her and that she had an appointment with the [facility] on 04/02/20 at 3:00pm . Patient further confirmed filling her prescription . Positive reinforcement provided for contacts made to coordinate ongoing mental health follow up and verbalizing use of positive coping skills . Encouraged patient to follow up with a therapist as soon as possible for ongoing treatment    Patient Self Care Activities:  . Performs ADL's independently . Performs IADL's independently  Patient Coping Strengths:  . Supportive Relationships . Able to Communicate Effectively  Patient Self Care Deficits:  . Patient has limited knowledge of local mental health provider  Please see past updates related to this goal by clicking on  the "Past Updates" button in the selected goal          The patient verbalized understanding of instructions provided today and declined a print copy of patient instruction materials.   No further follow up required: patient to follow up with the [facility] on 04/03/20

## 2020-02-06 ENCOUNTER — Other Ambulatory Visit: Payer: Self-pay

## 2020-02-06 ENCOUNTER — Emergency Department: Payer: 59

## 2020-02-06 ENCOUNTER — Encounter: Payer: Self-pay | Admitting: Intensive Care

## 2020-02-06 ENCOUNTER — Inpatient Hospital Stay
Admission: EM | Admit: 2020-02-06 | Discharge: 2020-02-09 | DRG: 880 | Disposition: A | Discharge status: Home / Self Care | Payer: 59 | Attending: Internal Medicine | Admitting: Internal Medicine

## 2020-02-06 DIAGNOSIS — Z87892 Personal history of anaphylaxis: Secondary | ICD-10-CM

## 2020-02-06 DIAGNOSIS — R569 Unspecified convulsions: Secondary | ICD-10-CM

## 2020-02-06 DIAGNOSIS — F1010 Alcohol abuse, uncomplicated: Secondary | ICD-10-CM | POA: Diagnosis present

## 2020-02-06 DIAGNOSIS — F40240 Claustrophobia: Secondary | ICD-10-CM | POA: Diagnosis present

## 2020-02-06 DIAGNOSIS — E872 Acidosis, unspecified: Secondary | ICD-10-CM

## 2020-02-06 DIAGNOSIS — F445 Conversion disorder with seizures or convulsions: Principal | ICD-10-CM | POA: Diagnosis present

## 2020-02-06 DIAGNOSIS — I340 Nonrheumatic mitral (valve) insufficiency: Secondary | ICD-10-CM | POA: Diagnosis present

## 2020-02-06 DIAGNOSIS — Z733 Stress, not elsewhere classified: Secondary | ICD-10-CM

## 2020-02-06 DIAGNOSIS — G40909 Epilepsy, unspecified, not intractable, without status epilepticus: Secondary | ICD-10-CM

## 2020-02-06 DIAGNOSIS — R05 Cough: Secondary | ICD-10-CM | POA: Diagnosis not present

## 2020-02-06 DIAGNOSIS — Z658 Other specified problems related to psychosocial circumstances: Secondary | ICD-10-CM

## 2020-02-06 DIAGNOSIS — J069 Acute upper respiratory infection, unspecified: Secondary | ICD-10-CM | POA: Diagnosis present

## 2020-02-06 DIAGNOSIS — Z87891 Personal history of nicotine dependence: Secondary | ICD-10-CM

## 2020-02-06 DIAGNOSIS — R55 Syncope and collapse: Secondary | ICD-10-CM | POA: Diagnosis present

## 2020-02-06 DIAGNOSIS — Y92009 Unspecified place in unspecified non-institutional (private) residence as the place of occurrence of the external cause: Secondary | ICD-10-CM

## 2020-02-06 DIAGNOSIS — Z79899 Other long term (current) drug therapy: Secondary | ICD-10-CM

## 2020-02-06 DIAGNOSIS — R45851 Suicidal ideations: Secondary | ICD-10-CM | POA: Diagnosis present

## 2020-02-06 DIAGNOSIS — T43206A Underdosing of unspecified antidepressants, initial encounter: Secondary | ICD-10-CM | POA: Diagnosis present

## 2020-02-06 DIAGNOSIS — F419 Anxiety disorder, unspecified: Secondary | ICD-10-CM | POA: Diagnosis present

## 2020-02-06 DIAGNOSIS — F329 Major depressive disorder, single episode, unspecified: Secondary | ICD-10-CM | POA: Diagnosis present

## 2020-02-06 DIAGNOSIS — Z91018 Allergy to other foods: Secondary | ICD-10-CM

## 2020-02-06 DIAGNOSIS — Z975 Presence of (intrauterine) contraceptive device: Secondary | ICD-10-CM

## 2020-02-06 DIAGNOSIS — Z91128 Patient's intentional underdosing of medication regimen for other reason: Secondary | ICD-10-CM

## 2020-02-06 DIAGNOSIS — R059 Cough, unspecified: Secondary | ICD-10-CM

## 2020-02-06 DIAGNOSIS — D573 Sickle-cell trait: Secondary | ICD-10-CM | POA: Diagnosis present

## 2020-02-06 DIAGNOSIS — Z9114 Patient's other noncompliance with medication regimen: Secondary | ICD-10-CM

## 2020-02-06 DIAGNOSIS — Z20822 Contact with and (suspected) exposure to covid-19: Secondary | ICD-10-CM | POA: Diagnosis present

## 2020-02-06 DIAGNOSIS — Z91010 Allergy to peanuts: Secondary | ICD-10-CM

## 2020-02-06 DIAGNOSIS — F32A Depression, unspecified: Secondary | ICD-10-CM | POA: Diagnosis present

## 2020-02-06 DIAGNOSIS — Z915 Personal history of self-harm: Secondary | ICD-10-CM

## 2020-02-06 HISTORY — DX: Gastritis, unspecified, without bleeding: K29.70

## 2020-02-06 LAB — CBC WITH DIFFERENTIAL/PLATELET
Abs Immature Granulocytes: 0 10*3/uL (ref 0.00–0.07)
Basophils Absolute: 0 10*3/uL (ref 0.0–0.1)
Basophils Relative: 1 %
Eosinophils Absolute: 0.2 10*3/uL (ref 0.0–0.5)
Eosinophils Relative: 4 %
HCT: 36.3 % (ref 36.0–46.0)
Hemoglobin: 12.6 g/dL (ref 12.0–15.0)
Immature Granulocytes: 0 %
Lymphocytes Relative: 29 %
Lymphs Abs: 1.7 10*3/uL (ref 0.7–4.0)
MCH: 28.3 pg (ref 26.0–34.0)
MCHC: 34.7 g/dL (ref 30.0–36.0)
MCV: 81.4 fL (ref 80.0–100.0)
Monocytes Absolute: 0.5 10*3/uL (ref 0.1–1.0)
Monocytes Relative: 9 %
Neutro Abs: 3.3 10*3/uL (ref 1.7–7.7)
Neutrophils Relative %: 57 %
Platelets: 174 10*3/uL (ref 150–400)
RBC: 4.46 MIL/uL (ref 3.87–5.11)
RDW: 12.6 % (ref 11.5–15.5)
WBC: 5.8 10*3/uL (ref 4.0–10.5)
nRBC: 0 % (ref 0.0–0.2)

## 2020-02-06 LAB — URINALYSIS, COMPLETE (UACMP) WITH MICROSCOPIC
Bacteria, UA: NONE SEEN
Bilirubin Urine: NEGATIVE
Glucose, UA: NEGATIVE mg/dL
Hgb urine dipstick: NEGATIVE
Ketones, ur: NEGATIVE mg/dL
Leukocytes,Ua: NEGATIVE
Nitrite: NEGATIVE
Protein, ur: NEGATIVE mg/dL
Specific Gravity, Urine: 1.006 (ref 1.005–1.030)
pH: 7 (ref 5.0–8.0)

## 2020-02-06 LAB — POCT PREGNANCY, URINE: Preg Test, Ur: NEGATIVE

## 2020-02-06 LAB — COMPREHENSIVE METABOLIC PANEL
ALT: 15 U/L (ref 0–44)
AST: 17 U/L (ref 15–41)
Albumin: 3.9 g/dL (ref 3.5–5.0)
Alkaline Phosphatase: 70 U/L (ref 38–126)
Anion gap: 6 (ref 5–15)
BUN: <5 mg/dL — ABNORMAL LOW (ref 6–20)
CO2: 27 mmol/L (ref 22–32)
Calcium: 9.1 mg/dL (ref 8.9–10.3)
Chloride: 107 mmol/L (ref 98–111)
Creatinine, Ser: 0.7 mg/dL (ref 0.44–1.00)
GFR calc Af Amer: >60 mL/min (ref >60)
GFR calc non Af Amer: >60 mL/min (ref >60)
Glucose, Bld: 94 mg/dL (ref 70–99)
Potassium: 3.5 mmol/L (ref 3.5–5.1)
Sodium: 140 mmol/L (ref 135–145)
Total Bilirubin: 0.6 mg/dL (ref 0.3–1.2)
Total Protein: 7.1 g/dL (ref 6.5–8.1)

## 2020-02-06 LAB — LACTIC ACID, PLASMA: Lactic Acid, Venous: 3.4 mmol/L (ref 0.5–1.9)

## 2020-02-06 LAB — GLUCOSE, CAPILLARY: Glucose-Capillary: 86 mg/dL (ref 70–99)

## 2020-02-06 LAB — FIBRIN DERIVATIVES D-DIMER (ARMC ONLY): Fibrin derivatives D-dimer (ARMC): 856.6 ng/mL (FEU) — ABNORMAL HIGH (ref 0.00–499.00)

## 2020-02-06 LAB — SARS CORONAVIRUS 2 BY RT PCR (HOSPITAL ORDER, PERFORMED IN ~~LOC~~ HOSPITAL LAB): SARS Coronavirus 2: NEGATIVE

## 2020-02-06 LAB — PROCALCITONIN: Procalcitonin: <0.1 ng/mL

## 2020-02-06 LAB — TROPONIN I (HIGH SENSITIVITY): Troponin I (High Sensitivity): <2 ng/L (ref <18)

## 2020-02-06 MED ORDER — SODIUM CHLORIDE 0.9 % IV BOLUS
1000.0000 mL | Freq: Once | INTRAVENOUS | Status: AC
  Administered 2020-02-06: 1000 mL via INTRAVENOUS

## 2020-02-06 MED ORDER — SODIUM CHLORIDE 0.9 % IV BOLUS
500.0000 mL | Freq: Once | INTRAVENOUS | Status: AC
  Administered 2020-02-06: 500 mL via INTRAVENOUS

## 2020-02-06 MED ORDER — ONDANSETRON 4 MG PO TBDP
8.0000 mg | ORAL_TABLET | Freq: Once | ORAL | Status: AC
  Administered 2020-02-06: 8 mg via ORAL
  Filled 2020-02-06: qty 2

## 2020-02-06 MED ORDER — IOHEXOL 350 MG/ML SOLN
75.0000 mL | Freq: Once | INTRAVENOUS | Status: AC | PRN
  Administered 2020-02-06: 75 mL via INTRAVENOUS

## 2020-02-06 MED ORDER — LORAZEPAM 2 MG/ML IJ SOLN
INTRAMUSCULAR | Status: AC
  Filled 2020-02-06: qty 1

## 2020-02-06 NOTE — ED Provider Notes (Addendum)
Called to patient's room for change in her clinical status. Found her with her arms straight up stretched above her head and shaking her arms and legs. Tachycardic. Resisted eye opening and in tact corneal reflexes during episode.  CBG within normal limits.  Stopped after ~60-90 seconds w/o intervention.  No tongue biting.  No incontinence.  Oriented afterwards. HR down trended appropriately. Added on CT head, procal, lactic for further differentiation. Clinically does not appear consistent w/ classic epileptic seizure. Consider syncopal convulsions vs pseudoseizure/PNES vs other psychiatric (has significant history). Normal electrolytes. Not pregnant. Awaiting CT PE for elevated dimer as well for work-up of her syncope.  Otherwise, her work-up thus far has been unremarkable.  COVID negative.  Electrolytes without actionable derangements.  Troponin negative.  EKG personally reviewed and interpreted by myself demonstrates normal sinus rhythm, normal axis, normal intervals, incomplete right bundle appearance, no acute arrhythmias or acute ischemic changes.  If remainder of her imaging is unremarkable, anticipate discharge with outpatient neurology follow-up and PCP vs cardiology for Holter monitoring.   Miguel Aschoff., MD 02/06/20 2348    Miguel Aschoff., MD 02/07/20 Perlie Mayo

## 2020-02-06 NOTE — ED Triage Notes (Signed)
FIRST NURSE: Pt to ER via EMS with reports of continuous cough.  Reported that she was talking to mother, began to have a coughing episode and "passed out".

## 2020-02-06 NOTE — ED Provider Notes (Signed)
Emergency Department Provider Note  ____________________________________________  Time seen: Approximately 7:56 PM  I have reviewed the triage vital signs and the nursing notes.   HISTORY  Chief Complaint Cough and Generalized Body Aches   Historian Patient    HPI [name] is a 24 y.o. female presents to the emergency department with shortness of breath, chest tightness, chest pain, cough, loss of taste and smell and generalized weakness.  Patient states that she had an episode of syncope after engaging in coughing while at home.  Patient had a witnessed episode of loss of consciousness while in the emergency department.  She was waiting to use the restroom and was unconscious for approximately 1 minute before regaining consciousness.  Patient did state that she had started coughing prior to syncopal episode.  Patient states that she has a history of a heart murmur but no other cardiac issues.   Past Medical History:  Diagnosis Date  . Anxiety   . Depression   . Gastritis   . Heart murmur   . Sickle cell trait (HCC)      Immunizations up to date:  Yes.     Past Medical History:  Diagnosis Date  . Anxiety   . Depression   . Gastritis   . Heart murmur   . Sickle cell trait [facility])     Patient Active Problem List   Diagnosis Date Noted  . Alcohol abuse 01/07/2020  . Suicide attempt by hanging (HCC) 01/07/2020  . H/O self-harm 01/07/2020  . Severe recurrent major depression without psychotic features (HCC) 11/30/2019  . Encounter for planned induction of labor 11/06/2018  . Anemia affecting pregnancy, antepartum 10/02/2018  . Circumvallate placenta, unspecified trimester 09/04/2018  . Depression 04/17/2018  . Supervision of other normal pregnancy, antepartum 03/25/2018    Past Surgical History:  Procedure Laterality Date  . NO PAST SURGERIES      Prior to Admission medications   Medication Sig Start Date End Date Taking? Authorizing Provider   famotidine (PEPCID) 20 MG tablet Take 1 tablet (20 mg total) by mouth 2 (two) times daily. 01/28/20   Irean Hong, MD  hydrOXYzine (ATARAX/VISTARIL) 25 MG tablet Take 1 tablet (25 mg total) by mouth 3 (three) times daily as needed for anxiety. 12/03/19   Armandina Stammer I, NP  ibuprofen (ADVIL,MOTRIN) 600 MG tablet Take 1 tablet (600 mg total) by mouth every 6 (six) hours. Patient not taking: Reported on 03/12/2019 11/07/18   Vena Austria, MD  lactulose (CHRONULAC) 10 GM/15ML solution Take 30 mLs (20 g total) by mouth daily as needed for mild constipation. 01/28/20   Irean Hong, MD  levonorgestrel (MIRENA) 20 MCG/24HR IUD 1 each by Intrauterine route once.    [provider]  Multiple Vitamins-Minerals (ONE DAILY MULTIVITAMIN ADULT) TABS Take 1 tablet by mouth daily.    [provider]  sertraline (ZOLOFT) 50 MG tablet Take 1 tablet (50 mg total) by mouth daily. For depression 01/20/20   Trey Sailors, PA-C  sucralfate (CARAFATE) 1 GM/10ML suspension Take 10 mLs (1 g total) by mouth 4 (four) times daily. 01/28/20   Irean Hong, MD  traZODone (DESYREL) 50 MG tablet Take 1 tablet (50 mg total) by mouth at bedtime as needed for sleep. 12/03/19   Armandina Stammer I, NP    Allergies Peanuts [peanut oil] and Mushroom extract complex  Family History  Problem Relation Age of Onset  . Hypertension Maternal Grandmother   . Healthy Mother   . Healthy  Father     Social History Social History   Tobacco Use  . Smoking status: Never Smoker  . Smokeless tobacco: Never Used  Vaping Use  . Vaping Use: Former  . Devices: vaped every day until a week ago  Substance Use Topics  . Alcohol use: Not Currently    Comment: 11/29/2019: drank 1.5 bottles of wine and 1/2 bottle of vodka mixed withj juidce  . Drug use: No     Review of Systems  Constitutional: No fever/chills Eyes:  No discharge ENT: No upper respiratory complaints. Respiratory: no cough. No SOB/ use of accessory muscles to  breath Cardiac: Patient has chest pain, chest tightness and shortness of breath. Gastrointestinal: Patient has nausea. Musculoskeletal: Negative for musculoskeletal pain. Skin: Negative for rash, abrasions, lacerations, ecchymosis.   ____________________________________________   PHYSICAL EXAM:  VITAL SIGNS: ED Triage Vitals  Enc Vitals Group     BP 02/06/20 1831 120/75     Pulse Rate 02/06/20 1831 92     Resp 02/06/20 1831 18     Temp 02/06/20 1831 98.9 F (37.2 C)     Temp Source 02/06/20 1831 Oral     SpO2 02/06/20 1831 99 %     Weight 02/06/20 1831 135 lb (61.2 kg)     Height 02/06/20 1831 5\' 10"  (1.778 m)     Head Circumference --      Peak Flow --      Pain Score 02/06/20 1840 6     Pain Loc --      Pain Edu? --      Excl. in [place]? --      Constitutional: Alert and oriented. Well appearing and in no acute distress. Eyes: Conjunctivae are normal. PERRL. EOMI. Head: Atraumatic. ENT:      Nose: No congestion/rhinnorhea.      Mouth/Throat: Mucous membranes are moist.  Neck: No stridor.  No cervical spine tenderness to palpation.  Cardiovascular: Normal rate, regular rhythm. Normal S1 and S2.  Good peripheral circulation. Respiratory: Normal respiratory effort without tachypnea or retractions. Lungs CTAB. Good air entry to the bases with no decreased or absent breath sounds Gastrointestinal: Bowel sounds x 4 quadrants. Soft and nontender to palpation. No guarding or rigidity. No distention. Musculoskeletal: Full range of motion to all extremities. No obvious deformities noted Neurologic:  Normal for age. No gross focal neurologic deficits are appreciated.  Skin:  Skin is warm, dry and intact. No rash noted. Psychiatric: Mood and affect are normal for age. Speech and behavior are normal.   ____________________________________________   LABS (all labs ordered are listed, but only abnormal results are displayed)  Labs Reviewed  SARS CORONAVIRUS 2 BY RT PCR (HOSPITAL  ORDER, PERFORMED IN [place])  CBC WITH DIFFERENTIAL/PLATELET  COMPREHENSIVE METABOLIC PANEL  FIBRIN DERIVATIVES D-DIMER ([facility] ONLY)  URINALYSIS, COMPLETE (UACMP) WITH MICROSCOPIC  POC URINE PREG, ED  TROPONIN I (HIGH SENSITIVITY)   ____________________________________________  EKG   ____________________________________________  RADIOLOGY 02/08/20, personally viewed and evaluated these images (plain radiographs) as part of my medical decision making, as well as reviewing the written report by the radiologist.  DG Chest 2 View  Result Date: 02/06/2020 CLINICAL DATA:  Dry cough EXAM: CHEST - 2 VIEW COMPARISON:  07/18/2017 FINDINGS: The heart size and mediastinal contours are within normal limits. Both lungs are clear. The visualized skeletal structures are unremarkable. IMPRESSION: No active cardiopulmonary disease. Electronically Signed   By: 07/20/2017 M.D.   On: 02/06/2020  19:14    ____________________________________________    PROCEDURES  Procedure(s) performed:     Procedures     Medications  sodium chloride 0.9 % bolus 500 mL (500 mLs Intravenous New Bag/Given 02/06/20 1934)  sodium chloride 0.9 % bolus 500 mL (500 mLs Intravenous New Bag/Given 02/06/20 1936)     ____________________________________________   INITIAL IMPRESSION / ASSESSMENT AND PLAN / ED COURSE  Pertinent labs & imaging results that were available during my care of the patient were reviewed by me and considered in my medical decision making (see chart for details).      Assessment and plan Chest pain Chest tightness Cough Syncope 24 year old female presents to the emergency department with with syncope, shortness of breath, chest tightness, chest pain.  Vital signs were reassuring at triage.  Patient had a witnessed episode of syncope in the emergency department.  Patient was moved to the main side of the emergency department for further care, management and  cardiac monitoring.  Patient report was given to Betha Loa PA-C.     ____________________________________________  FINAL CLINICAL IMPRESSION(S) / ED DIAGNOSES  Final diagnoses:  Cough      NEW MEDICATIONS STARTED DURING THIS VISIT:  ED Discharge Orders    None          This chart was dictated using voice recognition software/Dragon. Despite best efforts to proofread, errors can occur which can change the meaning. Any change was purely unintentional.     Lannie Fields, PA-C 02/06/20 2023    Lilia Pro., MD 02/07/20 [phone]

## 2020-02-06 NOTE — ED Notes (Signed)
Report to tom, lab tech in to venipuncture pt.

## 2020-02-06 NOTE — ED Provider Notes (Signed)
[facility] Emergency Department Provider Note  ____________________________________________  Time seen: Approximately 8:17 PM  I have reviewed the triage vital signs and the nursing notes.   HISTORY  Chief Complaint Cough and Generalized Body Aches   Assuming care from [facility], [place].  In short, [name] is a 24 y.o. female with a chief complaint of Cough and Generalized Body Aches .  Refer to the original H&P for additional details.     HPI [name] is a 24 y.o. female who is brought to the main side emergency department after she had a syncopal episode in fast track section of the emergency department.  Patient presented to the emergency department complaining of a syncopal episode at home, upper respiratory infection symptoms to include fever, nasal congestion, sore throat, cough.  Patient states that her symptoms began several days ago.  She initially thought they were related to allergies.  Patient given sore throat, progressed to nasal congestion and cough.  Patient states that the cough is constant at this time.  No known sick contacts.  Patient denies any headache, visual changes, chest pain, shortness of breath, abdominal pain, nausea or vomiting.  Patient does have a history of gastritis, heart murmur as a child that resolved, and sickle cell trait.  Patient has been taking allergy medications with no relief of symptoms.  Today patient had a syncopal episode while at home presented for evaluation.  During the initial part of her encounter, patient had left her room to use the restroom and had a syncopal episode in the hallway.  Patient was moved to the major side emergency department for closer evaluation.  Patient currently endorses a headache, nasal congestion, sore throat and cough.  She denies any chest pain, abdominal pain.  No nausea vomiting, diarrhea or constipation.       Past Medical History:  Diagnosis Date   Anxiety     Depression    Gastritis    Heart murmur    Sickle cell trait [facility])     Patient Active Problem List   Diagnosis Date Noted   Alcohol abuse 01/07/2020   Suicide attempt by hanging (HCC) 01/07/2020   H/O self-harm 01/07/2020   Severe recurrent major depression without psychotic features (HCC) 11/30/2019   Encounter for planned induction of labor 11/06/2018   Anemia affecting pregnancy, antepartum 10/02/2018   Circumvallate placenta, unspecified trimester 09/04/2018   Depression 04/17/2018   Supervision of other normal pregnancy, antepartum 03/25/2018    Past Surgical History:  Procedure Laterality Date   NO PAST SURGERIES      Prior to Admission medications   Medication Sig Start Date End Date Taking? Authorizing Provider  famotidine (PEPCID) 20 MG tablet Take 1 tablet (20 mg total) by mouth 2 (two) times daily. 01/28/20   Irean Hong, MD  hydrOXYzine (ATARAX/VISTARIL) 25 MG tablet Take 1 tablet (25 mg total) by mouth 3 (three) times daily as needed for anxiety. 12/03/19   Armandina Stammer I, NP  ibuprofen (ADVIL,MOTRIN) 600 MG tablet Take 1 tablet (600 mg total) by mouth every 6 (six) hours. Patient not taking: Reported on 03/12/2019 11/07/18   Vena Austria, MD  lactulose (CHRONULAC) 10 GM/15ML solution Take 30 mLs (20 g total) by mouth daily as needed for mild constipation. 01/28/20   Irean Hong, MD  levonorgestrel (MIRENA) 20 MCG/24HR IUD 1 each by Intrauterine route once.    [provider]  Multiple Vitamins-Minerals (ONE DAILY MULTIVITAMIN ADULT) TABS Take 1 tablet by  mouth daily.    [provider]  sertraline (ZOLOFT) 50 MG tablet Take 1 tablet (50 mg total) by mouth daily. For depression 01/20/20   Trinna Post, PA-C  sucralfate (CARAFATE) 1 GM/10ML suspension Take 10 mLs (1 g total) by mouth 4 (four) times daily. 01/28/20   Paulette Blanch, MD  traZODone (DESYREL) 50 MG tablet Take 1 tablet (50 mg total) by mouth at bedtime as needed for sleep.  12/03/19   Lindell Spar I, NP    Allergies Peanuts [peanut oil] and Mushroom extract complex  Family History  Problem Relation Age of Onset   Hypertension Maternal Grandmother    Healthy Mother    Healthy Father     Social History Social History   Tobacco Use   Smoking status: Never Smoker   Smokeless tobacco: Never Used  Scientific laboratory technician Use: Former   Devices: vaped every day until a week ago  Substance Use Topics   Alcohol use: Not Currently    Comment: 11/29/2019: drank 1.5 bottles of wine and 1/2 bottle of vodka mixed withj juidce   Drug use: No     Review of Systems  Constitutional: Positive fever/chills.  2 syncopal episodes today. Eyes: No visual changes. No discharge ENT: No upper respiratory complaints. Cardiovascular: no chest pain. Respiratory: Positive cough. No SOB. Gastrointestinal: No abdominal pain.  No nausea, no vomiting.  No diarrhea.  No constipation. Genitourinary: Negative for dysuria. No hematuria Musculoskeletal: Negative for musculoskeletal pain. Skin: Negative for rash, abrasions, lacerations, ecchymosis. Neurological: Negative for headaches, focal weakness or numbness. 10-point ROS otherwise negative.  ____________________________________________   PHYSICAL EXAM:  VITAL SIGNS: ED Triage Vitals  Enc Vitals Group     BP 02/06/20 1831 120/75     Pulse Rate 02/06/20 1831 92     Resp 02/06/20 1831 18     Temp 02/06/20 1831 98.9 F (37.2 C)     Temp Source 02/06/20 1831 Oral     SpO2 02/06/20 1831 99 %     Weight 02/06/20 1831 135 lb (61.2 kg)     Height 02/06/20 1831 5\' 10"  (1.778 m)     Head Circumference --      Peak Flow --      Pain Score 02/06/20 1840 6     Pain Loc --      Pain Edu? --      Excl. in [place]? --      Constitutional: Alert and oriented. Well appearing and in no acute distress. Eyes: Conjunctivae are normal. PERRL. EOMI. Head: Atraumatic. ENT:      Ears:       Nose: No congestion/rhinnorhea.       Mouth/Throat: Mucous membranes are moist.  Neck: No stridor.   Hematological/Lymphatic/Immunilogical: No cervical lymphadenopathy. Cardiovascular: Normal rate, regular rhythm. Normal S1 and S2.  Good peripheral circulation. Respiratory: Normal respiratory effort without tachypnea or retractions. Lungs CTAB. Good air entry to the bases with no decreased or absent breath sounds. Gastrointestinal: Bowel sounds 4 quadrants. Soft and nontender to palpation. No guarding or rigidity. No palpable masses. No distention. No CVA tenderness. Musculoskeletal: Full range of motion to all extremities. No gross deformities appreciated. Neurologic:  Normal speech and language. No gross focal neurologic deficits are appreciated.  Skin:  Skin is warm, dry and intact. No rash noted. Psychiatric: Mood and affect are normal. Speech and behavior are normal. Patient exhibits appropriate insight and judgement.   ____________________________________________   LABS (all labs ordered are  listed, but only abnormal results are displayed)  Labs Reviewed  SARS CORONAVIRUS 2 BY RT PCR (HOSPITAL ORDER, PERFORMED IN [place] [facility])  CBC WITH DIFFERENTIAL/PLATELET  COMPREHENSIVE METABOLIC PANEL  FIBRIN DERIVATIVES D-DIMER ([facility] ONLY)  URINALYSIS, COMPLETE (UACMP) WITH MICROSCOPIC  POC URINE PREG, ED  TROPONIN I (HIGH SENSITIVITY)   ____________________________________________  EKG   ____________________________________________  RADIOLOGY I personally viewed and evaluated these images as part of my medical decision making, as well as reviewing the written report by the radiologist.  DG Chest 2 View  Result Date: 02/06/2020 CLINICAL DATA:  Dry cough EXAM: CHEST - 2 VIEW COMPARISON:  07/18/2017 FINDINGS: The heart size and mediastinal contours are within normal limits. Both lungs are clear. The visualized skeletal structures are unremarkable. IMPRESSION: No active cardiopulmonary disease. Electronically  Signed   By: Charlett Nose M.D.   On: 02/06/2020 19:14    ____________________________________________    PROCEDURES  Procedure(s) performed:    Procedures    Medications  sodium chloride 0.9 % bolus 500 mL (500 mLs Intravenous New Bag/Given 02/06/20 1934)  sodium chloride 0.9 % bolus 500 mL (500 mLs Intravenous New Bag/Given 02/06/20 1936)     ____________________________________________   INITIAL IMPRESSION / ASSESSMENT AND PLAN / ED COURSE  Pertinent labs & imaging results that were available during my care of the patient were reviewed by me and considered in my medical decision making (see chart for details).  Review of the [place] CSRS was performed in accordance of the [facility] prior to dispensing any controlled drugs.           Patient care be transferred to attending provider Dr. Colon Branch prior to final diagnosis and disposition.  Patient presented to emergency department with upper respiratory infection symptoms.  Patient then had a syncopal episode here in the emergency department.  Patient states that she had also had a syncopal episode prior to arrival today.  At this time, patient is stable.  Awaiting results.  Patient's history, physical exam findings, work-up to this point has been discussed with attending provider, Dr. Colon Branch.    This chart was dictated using voice recognition software/Dragon. Despite best efforts to proofread, errors can occur which can change the meaning. Any change was purely unintentional.    Racheal Patches, PA-C 02/06/20 2120    Miguel Aschoff., MD 02/07/20 [id number]    Miguel Aschoff., MD 02/07/20 [phone]

## 2020-02-06 NOTE — ED Notes (Signed)
Critical lactic acid of 3.4  Called form lab. Dr. Colon Branch notified, no new verbal orders received.

## 2020-02-06 NOTE — ED Notes (Signed)
Pt to ct 

## 2020-02-06 NOTE — ED Triage Notes (Signed)
Patient c/o dry cough, body aches, and fever for a few days.

## 2020-02-06 NOTE — ED Notes (Signed)
Pt with syncopal episode going to restroom. Syncopal episode witnessed by xray tech. [place], pa, this rn and [place], rn to pt's side. Pt regained consciousness after approx 30 seconds, assisted to wheelchair. Pt states she is nauseated.

## 2020-02-06 NOTE — ED Notes (Signed)
Pts friend approached nursing station and stated that he thought she had passed out again. This nurse entered the room to find the patient unresponsive but with stable vitals. Pt did not rouse with sternal rub. Dr Colon Branch notified. The patient then exhibited tremors of her upper and lower extremities followed by violent convulsant like activity. Dr Colon Branch at patient bedside. Activity continued for approx 120 seconds before patient calmed. She was able to respond to Dr Colon Branch questions. Received verbal orders for lactic acid and procalcitonin.

## 2020-02-07 ENCOUNTER — Encounter: Payer: Self-pay | Admitting: Internal Medicine

## 2020-02-07 ENCOUNTER — Other Ambulatory Visit: Payer: Self-pay

## 2020-02-07 DIAGNOSIS — R569 Unspecified convulsions: Secondary | ICD-10-CM

## 2020-02-07 DIAGNOSIS — F445 Conversion disorder with seizures or convulsions: Principal | ICD-10-CM

## 2020-02-07 DIAGNOSIS — G40909 Epilepsy, unspecified, not intractable, without status epilepticus: Secondary | ICD-10-CM

## 2020-02-07 DIAGNOSIS — F419 Anxiety disorder, unspecified: Secondary | ICD-10-CM

## 2020-02-07 DIAGNOSIS — E872 Acidosis, unspecified: Secondary | ICD-10-CM

## 2020-02-07 DIAGNOSIS — J069 Acute upper respiratory infection, unspecified: Secondary | ICD-10-CM

## 2020-02-07 DIAGNOSIS — R55 Syncope and collapse: Secondary | ICD-10-CM

## 2020-02-07 LAB — URINE DRUG SCREEN, QUALITATIVE (ARMC ONLY)
Amphetamines, Ur Screen: NOT DETECTED
Barbiturates, Ur Screen: NOT DETECTED
Benzodiazepine, Ur Scrn: NOT DETECTED
Cannabinoid 50 Ng, Ur ~~LOC~~: NOT DETECTED
Cocaine Metabolite,Ur ~~LOC~~: NOT DETECTED
MDMA (Ecstasy)Ur Screen: NOT DETECTED
Methadone Scn, Ur: NOT DETECTED
Opiate, Ur Screen: NOT DETECTED
Phencyclidine (PCP) Ur S: NOT DETECTED
Tricyclic, Ur Screen: NOT DETECTED

## 2020-02-07 LAB — LACTIC ACID, PLASMA: Lactic Acid, Venous: 0.8 mmol/L (ref 0.5–1.9)

## 2020-02-07 LAB — TSH: TSH: 1.443 u[IU]/mL (ref 0.350–4.500)

## 2020-02-07 LAB — HIV ANTIBODY (ROUTINE TESTING W REFLEX): HIV Screen 4th Generation wRfx: NONREACTIVE

## 2020-02-07 LAB — CK: Total CK: 148 U/L (ref 38–234)

## 2020-02-07 MED ORDER — SODIUM CHLORIDE 0.9 % IV SOLN
75.0000 mL/h | INTRAVENOUS | Status: DC
  Administered 2020-02-07 – 2020-02-08 (×3): 75 mL/h via INTRAVENOUS

## 2020-02-07 MED ORDER — BENZONATATE 100 MG PO CAPS
100.0000 mg | ORAL_CAPSULE | Freq: Three times a day (TID) | ORAL | Status: DC | PRN
  Administered 2020-02-07 – 2020-02-08 (×2): 100 mg via ORAL
  Filled 2020-02-07 (×2): qty 1

## 2020-02-07 MED ORDER — ENOXAPARIN SODIUM 40 MG/0.4ML ~~LOC~~ SOLN
40.0000 mg | SUBCUTANEOUS | Status: DC
  Administered 2020-02-08: 10:00:00 40 mg via SUBCUTANEOUS
  Filled 2020-02-07 (×3): qty 0.4

## 2020-02-07 MED ORDER — FLUTICASONE PROPIONATE 50 MCG/ACT NA SUSP
2.0000 | Freq: Every day | NASAL | Status: DC
  Administered 2020-02-07 – 2020-02-09 (×3): 2 via NASAL
  Filled 2020-02-07: qty 16

## 2020-02-07 MED ORDER — DM-GUAIFENESIN ER 30-600 MG PO TB12
1.0000 | ORAL_TABLET | Freq: Two times a day (BID) | ORAL | Status: DC
  Administered 2020-02-07: 1 via ORAL
  Filled 2020-02-07: qty 1

## 2020-02-07 MED ORDER — ONDANSETRON HCL 4 MG PO TABS: 4.0000 mg | ORAL_TABLET | Freq: Four times a day (QID) | ORAL | Status: DC | PRN

## 2020-02-07 MED ORDER — ONDANSETRON HCL 4 MG/2ML IJ SOLN
4.0000 mg | Freq: Four times a day (QID) | INTRAMUSCULAR | Status: DC | PRN
  Administered 2020-02-08: 4 mg via INTRAVENOUS
  Filled 2020-02-07: qty 2

## 2020-02-07 MED ORDER — LORAZEPAM 2 MG/ML IJ SOLN
1.0000 mg | INTRAMUSCULAR | Status: DC | PRN
  Administered 2020-02-08 (×2): 2 mg via INTRAVENOUS
  Filled 2020-02-07 (×3): qty 1

## 2020-02-07 MED ORDER — SERTRALINE HCL 50 MG PO TABS
50.0000 mg | ORAL_TABLET | Freq: Every day | ORAL | Status: DC
  Administered 2020-02-07 – 2020-02-09 (×3): 50 mg via ORAL
  Filled 2020-02-07 (×3): qty 1

## 2020-02-07 NOTE — Progress Notes (Signed)
PROGRESS NOTE    [name]  OFB:[id number] DOB: [birth year] DOA: 02/06/2020 PCP: Trinna Post, PA-C    Chief Complaint  Patient presents with  . Cough  . Generalized Body Aches    Brief Narrative: 24 year old female with depression presented with recent history of sinus congestion sore throat with episode of loss of consciousness.  While in the ED she had a similar episode with suspected seizure-like activity.  Blood work done in the ED showed elevated lactic acid of 3.4.  Patient also complained of palpitations for which CT angiogram of the chest was done which was negative for PE. Placed in observation for further management. While on the floor next morning she had jerky movements lasting for about 30 seconds, resolved without intervention.  Prior to that the heart rate had gone up to 180s.  Patient reported feeling dizzy.   Assessment & Plan:   Principal Problem: ?seizure-like activity ([place]) Head CT unremarkable.  EEG pending.  Consult appreciated and not convinced of having seizure activity.  Likely has syncope.  Check orthostasis.  EKG shows RVH and peaked T waves. TSH normal. Negative drug screen. Obtain 2D echo.  Active Problems: Sinus tachycardia Check orthostasis.  Normal TSH.  Follow 2D echo.  IV fluids.  Elevated lactic acid Possibly due to dehydration.  Resolved.  Chronic depression Continue home meds   DVT prophylaxis: Subcu Lovenox Code Status: Full code Family Communication: None at best Disposition:   Status is: Observation  The patient remains OBS appropriate and will d/c before 2 midnights.  Dispo: The patient is from: Home              Anticipated d/c is to: Home              Anticipated d/c date is: 1 day              Patient currently is not medically stable to d/c.       Consultants:   Neurology   Procedures: CT head, EEG, 2D echo   Antimicrobials: None   Subjective: Seen and examined.  Was tachycardic to 180s shortly  after arriving to the floor and nurse reported witnessing about 30 seconds of jerking movement.  Subsided without intervention.  Patient questioned having palpitations and feeling dizzy.  Objective: Vitals:   02/07/20 0600 02/07/20 0914 02/07/20 1042 02/07/20 1100  BP: 111/67 114/67 123/67 122/65  Pulse:  72 73 70  Resp: 15 11 11 17   Temp:   98.7 F (37.1 C)   TempSrc:   Oral   SpO2:  100% 99% 100%  Weight:      Height:        Intake/Output Summary (Last 24 hours) at 02/07/2020 1629 Last data filed at 02/07/2020 1559 Gross per 24 hour  Intake 3276.33 ml  Output --  Net 3276.33 ml   Filed Weights   02/06/20 1831  Weight: 61.2 kg    Examination:  General: Young female not in distress HEENT: Moist mucosa, supple neck Chest: Clear to auscultation bilaterally CVS: Normal S1-S2, no murmurs rub or gallop GI: Soft, nondistended, nontender Musculoskeletal: Warm, no edema CNS: Alert and oriented, no focal weakness    Data Reviewed: I have personally reviewed following labs and imaging studies  CBC: Recent Labs  Lab 02/06/20 1946  WBC 5.8  NEUTROABS 3.3  HGB 12.6  HCT 36.3  MCV 81.4  PLT 782    Basic Metabolic Panel: Recent Labs  Lab 02/06/20 1946  NA 140  K  3.5  CL 107  CO2 27  GLUCOSE 94  BUN <5*  CREATININE 0.70  CALCIUM 9.1    GFR: Estimated Creatinine Clearance: 104.8 mL/min (by C-G formula based on SCr of 0.7 mg/dL).  Liver Function Tests: Recent Labs  Lab 02/06/20 1946  AST 17  ALT 15  ALKPHOS 70  BILITOT 0.6  PROT 7.1  ALBUMIN 3.9    CBG: Recent Labs  Lab 02/06/20 2220  GLUCAP 86     Recent Results (from the past 240 hour(s))  SARS Coronavirus 2 by RT PCR (hospital order, performed in [facility] hospital lab) Nasopharyngeal Nasopharyngeal Swab     Status: None   Collection Time: 02/06/20  8:27 PM   Specimen: Nasopharyngeal Swab  Result Value Ref Range Status   SARS Coronavirus 2 NEGATIVE NEGATIVE Final    Comment:  (NOTE) SARS-CoV-2 target nucleic acids are NOT DETECTED.  The SARS-CoV-2 RNA is generally detectable in upper and lower respiratory specimens during the acute phase of infection. The lowest concentration of SARS-CoV-2 viral copies this assay can detect is 250 copies / mL. A negative result does not preclude SARS-CoV-2 infection and should not be used as the sole basis for treatment or other patient management decisions.  A negative result may occur with improper specimen collection / handling, submission of specimen other than nasopharyngeal swab, presence of viral mutation(s) within the areas targeted by this assay, and inadequate number of viral copies (<250 copies / mL). A negative result must be combined with clinical observations, patient history, and epidemiological information.  Fact Sheet for Patients:   BoilerBrush.com.cy  Fact Sheet for Healthcare Providers: https://pope.com/  This test is not yet approved or  cleared by the [place] FDA and has been authorized for detection and/or diagnosis of SARS-CoV-2 by FDA under an Emergency Use Authorization (EUA).  This EUA will remain in effect (meaning this test can be used) for the duration of the COVID-19 declaration under Section 564(b)(1) of the Act, 21 U.S.C. section 360bbb-3(b)(1), unless the authorization is terminated or revoked sooner.  Performed at [facility], [address], [place], [place] [address]          Radiology Studies: DG Chest 2 View  Result Date: 02/06/2020 CLINICAL DATA:  Dry cough EXAM: CHEST - 2 VIEW COMPARISON:  07/18/2017 FINDINGS: The heart size and mediastinal contours are within normal limits. Both lungs are clear. The visualized skeletal structures are unremarkable. IMPRESSION: No active cardiopulmonary disease. Electronically Signed   By: Charlett Nose M.D.   On: 02/06/2020 19:14   CT Head Wo Contrast  Result Date:  02/06/2020 CLINICAL DATA:  Syncope EXAM: CT HEAD WITHOUT CONTRAST TECHNIQUE: Contiguous axial images were obtained from the base of the skull through the vertex without intravenous contrast. COMPARISON:  None. FINDINGS: Brain: No acute intracranial abnormality. Specifically, no hemorrhage, hydrocephalus, mass lesion, acute infarction, or significant intracranial injury. Vascular: No hyperdense vessel or unexpected calcification. Skull: No acute calvarial abnormality. Sinuses/Orbits: Marland Kitchen Mucosal thickening No air-fluid levels. Throughout the paranasal sinuses Other: None IMPRESSION: No intracranial abnormality. Chronic sinusitis. Electronically Signed   By: Charlett Nose M.D.   On: 02/06/2020 23:29   CT Angio Chest PE W/Cm &/Or Wo Cm  Result Date: 02/06/2020 CLINICAL DATA:  Shortness of breath and recent syncopal episode with cough EXAM: CT ANGIOGRAPHY CHEST WITH CONTRAST TECHNIQUE: Multidetector CT imaging of the chest was performed using the standard protocol during bolus administration of intravenous contrast. Multiplanar CT image reconstructions and MIPs were obtained  to evaluate the vascular anatomy. CONTRAST:  75mL OMNIPAQUE IOHEXOL 350 MG/ML SOLN COMPARISON:  None. FINDINGS: Cardiovascular: Thoracic aorta and its branches are within normal limits. No aneurysmal dilatation or dissection is seen. The heart is not significantly enlarged. The pulmonary artery shows a normal branching pattern. No focal filling defect is identified to suggest pulmonary embolism. No significant coronary calcifications are noted. Mediastinum/Nodes: Thoracic inlet is within normal limits. No hilar or mediastinal adenopathy is noted. The esophagus as visualized is within normal limits. Lungs/Pleura: Lungs are well aerated bilaterally. No focal infiltrate or sizable effusion is seen. Upper Abdomen: Visualized upper abdomen shows no focal abnormality. Musculoskeletal: No bony structures show no acute abnormality. Review of the MIP images  confirms the above findings. IMPRESSION: No evidence of pulmonary emboli. No acute abnormality noted. Electronically Signed   By: Alcide Clever M.D.   On: 02/06/2020 23:31        Scheduled Meds: . dextromethorphan-guaiFENesin  1 tablet Oral BID  . enoxaparin (LOVENOX) injection  40 mg Subcutaneous Q24H  . fluticasone  2 spray Each Nare Daily  . sertraline  50 mg Oral Daily   Continuous Infusions: . sodium chloride 75 mL/hr (02/07/20 1559)     LOS: 0 days    Time spent: 25 minutes    Piers Baade, MD [facility]   To contact the attending provider between 7A-7P or the covering provider during after hours 7P-7A, please log into the web site [email or web address] and access using universal [place] password for that web site. If you do not have the password, please call the hospital operator.  02/07/2020, 4:29 PM

## 2020-02-07 NOTE — H&P (Signed)
History and Physical    [name] PFX:[id number] DOB: 02-15-1996 DOA: 02/06/2020  PCP: Trey Sailors, PA-C   Patient coming from: Home  I have personally briefly reviewed patient's old medical records in [facility]  Chief Complaint: Syncope, sore throat, nasal congestion  HPI: [name] is a 24 y.o. female with medical history significant for depression who presented to the emergency room initially with complaints of sinus congestion and sore throat not responding to over-the-counter medication.  While at home today she had an episode where she lost consciousness but did not fall or hurt herself.  While awaiting evaluation in the emergency room she had another episode in the hallway.  She was initially in fast track and was moved to the general ER where she had a witnessed seizure-like activity.  Seizure was witnessed by ER provider and did not appear consistent with classic epileptic seizure.  Patient now awake and alert complains of a mild headache.  Apart from her cold symptoms of cough and nasal congestion and sore throat, she denies fever or chills, denies chest pain or shortness of breath.  Has had no nausea, vomiting or change in bowel habits.  Denies abdominal pain  ED Course: Patient had an extensive work-up in the ER with negative drug screen and normal labs except for elevated lactic acid of 3.4.  She underwent imaging with CTA of the chest, chest x-ray and head CT all unremarkable.  Hospitalist consulted for admission.  Review of Systems: As per HPI otherwise 10 point review of systems negative.    Past Medical History:  Diagnosis Date  . Anxiety   . Depression   . Gastritis   . Heart murmur   . Sickle cell trait [facility])     Past Surgical History:  Procedure Laterality Date  . NO PAST SURGERIES       reports that she has never smoked. She has never used smokeless tobacco. She reports previous alcohol use. She reports that she does not use  drugs.  Allergies  Allergen Reactions  . Peanuts [Peanut Oil] Anaphylaxis, Hives and Swelling    Tree nuts also   . Mushroom Extract Complex     Family History  Problem Relation Age of Onset  . Hypertension Maternal Grandmother   . Healthy Mother   . Healthy Father      Prior to Admission medications   Medication Sig Start Date End Date Taking? Authorizing Provider  famotidine (PEPCID) 20 MG tablet Take 1 tablet (20 mg total) by mouth 2 (two) times daily. 01/28/20   Irean Hong, MD  hydrOXYzine (ATARAX/VISTARIL) 25 MG tablet Take 1 tablet (25 mg total) by mouth 3 (three) times daily as needed for anxiety. 12/03/19   Armandina Stammer I, NP  ibuprofen (ADVIL,MOTRIN) 600 MG tablet Take 1 tablet (600 mg total) by mouth every 6 (six) hours. Patient not taking: Reported on 03/12/2019 11/07/18   Vena Austria, MD  lactulose (CHRONULAC) 10 GM/15ML solution Take 30 mLs (20 g total) by mouth daily as needed for mild constipation. 01/28/20   Irean Hong, MD  levonorgestrel (MIRENA) 20 MCG/24HR IUD 1 each by Intrauterine route once.    [provider]  Multiple Vitamins-Minerals (ONE DAILY MULTIVITAMIN ADULT) TABS Take 1 tablet by mouth daily.    [provider]  sertraline (ZOLOFT) 50 MG tablet Take 1 tablet (50 mg total) by mouth daily. For depression 01/20/20   Trey Sailors, PA-C  sucralfate (CARAFATE) 1 GM/10ML suspension Take 10  mLs (1 g total) by mouth 4 (four) times daily. 01/28/20   Irean Hong, MD  traZODone (DESYREL) 50 MG tablet Take 1 tablet (50 mg total) by mouth at bedtime as needed for sleep. 12/03/19   Armandina Stammer I, NP    Physical Exam: Vitals:   02/07/20 0007 02/07/20 0008 02/07/20 0009 02/07/20 0010  BP:      Pulse: 70 70 70 71  Resp: 18 15 (!) 9 12  Temp:      TempSrc:      SpO2: 100% 99% 98% 97%  Weight:      Height:         Vitals:   02/07/20 0007 02/07/20 0008 02/07/20 0009 02/07/20 0010  BP:      Pulse: 70 70 70 71  Resp: 18 15 (!) 9 12   Temp:      TempSrc:      SpO2: 100% 99% 98% 97%  Weight:      Height:          Constitutional: Alert and oriented x 3 . Not in any apparent distress HEENT: Nasal congestion      Head: Normocephalic and atraumatic.         Eyes: PERLA, EOMI, Conjunctivae are normal. Sclera is non-icteric.       Mouth/Throat: Mucous membranes are moist.       Neck: Supple with no signs of meningismus. Cardiovascular: Regular rate and rhythm. No murmurs, gallops, or rubs. 2+ symmetrical distal pulses are present . No JVD. No LE edema Respiratory:  Actively coughing.  Respiratory effort normal .Lungs sounds clear bilaterally. No wheezes, crackles, or rhonchi.  Gastrointestinal: Soft, non tender, and non distended with positive bowel sounds. No rebound or guarding. Genitourinary: No CVA tenderness. Musculoskeletal: Nontender with normal range of motion in all extremities. No edema, cyanosis, or erythema of extremities. Neurologic: Normal speech and language. Face is symmetric. Moving all extremities. No gross focal neurologic deficits . Skin: Skin is warm, dry.  No rash or ulcers Psychiatric: Mood and affect are normal Speech and behavior are normal   Labs on Admission: I have personally reviewed following labs and imaging studies  CBC: Recent Labs  Lab 02/06/20 1946  WBC 5.8  NEUTROABS 3.3  HGB 12.6  HCT 36.3  MCV 81.4  PLT 174   Basic Metabolic Panel: Recent Labs  Lab 02/06/20 1946  NA 140  K 3.5  CL 107  CO2 27  GLUCOSE 94  BUN <5*  CREATININE 0.70  CALCIUM 9.1   GFR: Estimated Creatinine Clearance: 104.8 mL/min (by C-G formula based on SCr of 0.7 mg/dL). Liver Function Tests: Recent Labs  Lab 02/06/20 1946  AST 17  ALT 15  ALKPHOS 70  BILITOT 0.6  PROT 7.1  ALBUMIN 3.9   No results for input(s): LIPASE, AMYLASE in the last 168 hours. No results for input(s): AMMONIA in the last 168 hours. Coagulation Profile: No results for input(s): INR, PROTIME in the last 168  hours. Cardiac Enzymes: No results for input(s): CKTOTAL, CKMB, CKMBINDEX, TROPONINI in the last 168 hours. BNP (last 3 results) No results for input(s): PROBNP in the last 8760 hours. HbA1C: No results for input(s): HGBA1C in the last 72 hours. CBG: Recent Labs  Lab 02/06/20 2220  GLUCAP 86   Lipid Profile: No results for input(s): CHOL, HDL, LDLCALC, TRIG, CHOLHDL, LDLDIRECT in the last 72 hours. Thyroid Function Tests: No results for input(s): TSH, T4TOTAL, FREET4, T3FREE, THYROIDAB in the last 72 hours.  Anemia Panel: No results for input(s): VITAMINB12, FOLATE, FERRITIN, TIBC, IRON, RETICCTPCT in the last 72 hours. Urine analysis:    Component Value Date/Time   COLORURINE YELLOW (A) 02/06/2020 2031   APPEARANCEUR CLEAR (A) 02/06/2020 2031   APPEARANCEUR Hazy 09/12/2013 1752   LABSPEC 1.006 02/06/2020 2031   LABSPEC 1.011 09/12/2013 1752   PHURINE 7.0 02/06/2020 2031   GLUCOSEU NEGATIVE 02/06/2020 2031   GLUCOSEU Negative 09/12/2013 1752   HGBUR NEGATIVE 02/06/2020 2031   BILIRUBINUR NEGATIVE 02/06/2020 2031   BILIRUBINUR Negative 09/12/2013 1752   KETONESUR NEGATIVE 02/06/2020 2031   PROTEINUR NEGATIVE 02/06/2020 2031   NITRITE NEGATIVE 02/06/2020 2031   LEUKOCYTESUR NEGATIVE 02/06/2020 2031   LEUKOCYTESUR Negative 09/12/2013 1752    Radiological Exams on Admission: DG Chest 2 View  Result Date: 02/06/2020 CLINICAL DATA:  Dry cough EXAM: CHEST - 2 VIEW COMPARISON:  07/18/2017 FINDINGS: The heart size and mediastinal contours are within normal limits. Both lungs are clear. The visualized skeletal structures are unremarkable. IMPRESSION: No active cardiopulmonary disease. Electronically Signed   By: Rolm Baptise M.D.   On: 02/06/2020 19:14   CT Head Wo Contrast  Result Date: 02/06/2020 CLINICAL DATA:  Syncope EXAM: CT HEAD WITHOUT CONTRAST TECHNIQUE: Contiguous axial images were obtained from the base of the skull through the vertex without intravenous contrast.  COMPARISON:  None. FINDINGS: Brain: No acute intracranial abnormality. Specifically, no hemorrhage, hydrocephalus, mass lesion, acute infarction, or significant intracranial injury. Vascular: No hyperdense vessel or unexpected calcification. Skull: No acute calvarial abnormality. Sinuses/Orbits: Marland Kitchen Mucosal thickening No air-fluid levels. Throughout the paranasal sinuses Other: None IMPRESSION: No intracranial abnormality. Chronic sinusitis. Electronically Signed   By: Rolm Baptise M.D.   On: 02/06/2020 23:29   CT Angio Chest PE W/Cm &/Or Wo Cm  Result Date: 02/06/2020 CLINICAL DATA:  Shortness of breath and recent syncopal episode with cough EXAM: CT ANGIOGRAPHY CHEST WITH CONTRAST TECHNIQUE: Multidetector CT imaging of the chest was performed using the standard protocol during bolus administration of intravenous contrast. Multiplanar CT image reconstructions and MIPs were obtained to evaluate the vascular anatomy. CONTRAST:  48mL OMNIPAQUE IOHEXOL 350 MG/ML SOLN COMPARISON:  None. FINDINGS: Cardiovascular: Thoracic aorta and its branches are within normal limits. No aneurysmal dilatation or dissection is seen. The heart is not significantly enlarged. The pulmonary artery shows a normal branching pattern. No focal filling defect is identified to suggest pulmonary embolism. No significant coronary calcifications are noted. Mediastinum/Nodes: Thoracic inlet is within normal limits. No hilar or mediastinal adenopathy is noted. The esophagus as visualized is within normal limits. Lungs/Pleura: Lungs are well aerated bilaterally. No focal infiltrate or sizable effusion is seen. Upper Abdomen: Visualized upper abdomen shows no focal abnormality. Musculoskeletal: No bony structures show no acute abnormality. Review of the MIP images confirms the above findings. IMPRESSION: No evidence of pulmonary emboli. No acute abnormality noted. Electronically Signed   By: Inez Catalina M.D.   On: 02/06/2020 23:31    EKG:  Independently reviewed.   Assessment/Plan Principal Problem:   Witnessed seizure-like activity ([place])   Syncope -Patient presented with syncopal episode at home, and one in the ER and another seizure-like episode -Description of seizure, per ER doc's report not consistent with classical seizures -Head CT with no acute intracranial finding -Consult neurology for assistance with diagnosis -EEG, cardiac monitor, check TSH -Seizure precautions and neurologic checks -Ativan as needed seizure    Lactic acidosis -Isolated elevation with otherwise normal blood work -Possibly related to true seizure -IV hydration and  continue to monitor    URTI (acute upper respiratory infection) -Patient presented with a several day history of cough, nasal congestion and sore throat -Covid negative, WBC normal, chest x-ray negative -Symptomatic treatment    Depression -Continue home meds pending med rec     DVT prophylaxis: Lovenox  Code Status: full code  Family Communication:  none  Disposition Plan: Back to previous home environment Consults called: neurology Status:obs    Andris Baumann MD [facility]     02/07/2020, 12:48 AM

## 2020-02-07 NOTE — ED Notes (Signed)
Report given to Alex RN.

## 2020-02-07 NOTE — ED Notes (Signed)
Waiting on mucinex from pharmacy.

## 2020-02-07 NOTE — Progress Notes (Signed)
   02/07/20 1245  Clinical Encounter Type  Visited With Patient  Visit Type Initial  Referral From Nurse  Consult/Referral To Chaplain  Responded to a page from nurse. Pt said there was a mix-up she did not request a Ch. Pt welcome Ch in the room. Pt thought Ch was there to deliver bad news. Ch explain to Pt we go everywhere in the hospital visiting Pt. Pt was relieved. Ch prayed for PT and will follow-up with Pt.

## 2020-02-07 NOTE — Procedures (Signed)
Patient Name: Gracelynne Benedict  MRN: [id number]  Epilepsy Attending: Charlsie Quest  Referring Physician/Provider: Dr. Lindajo Royal Date: 02/07/2019 Duration: 22.09 mins  Patient history: 24 year old female noted to have shaking episode concerning for seizures.  EEG evaluate for seizures.  Level of alertness: Awake, asleep  AEDs during EEG study: None  Technical aspects: This EEG study was done with scalp electrodes positioned according to the 10-20 International system of electrode placement. Electrical activity was acquired at a sampling rate of 500Hz  and reviewed with a high frequency filter of 70Hz  and a low frequency filter of 1Hz . EEG data were recorded continuously and digitally stored.   Description: The posterior dominant rhythm consists of 11 Hz activity of moderate voltage (25-35 uV) seen predominantly in posterior head regions, symmetric and reactive to eye opening and eye closing. .Sleep was characterized by vertex waves, sleep spindles (12 to 14 Hz), maximal frontocentral region. Physiology photic driving was not seen during photic stimulation.  Hyperventilation was not performed.     One event was recorded during this study where patient was noted to have head bobbing, not responding to questions after photic stimulation at 15Hz , Concomitant eeg before, during and after the event didn't show any eeg change to suggest seizure.   IMPRESSION: This study is within normal limits. No seizures or epileptiform discharges were seen throughout the recording.  One event of head bobbing and unresponsiveness was recorded during this study without concomitant eeg change was NOT epileptic.   Andretta Ergle 

## 2020-02-07 NOTE — Progress Notes (Signed)
This RN noticed that patients heart rate on telemetry monitor at nurses desk was in the 180's around 1137 so this RN entered patient room to find patient having full body convulsions. Pt stopped shaking about 30 seconds after this RN entered the room. No ativan was given. Pt did not bite her tongue, or get injured during this episode. Pt was not postictal. She just stated that she had a headache. Pt states that before the seizure she got hot and her hand started to tremor then she does not remember anything else. MD notified and came to bedside to assess patient. Pt's Mother updated.

## 2020-02-07 NOTE — ED Notes (Signed)
Dr Duncan in to see pt 

## 2020-02-07 NOTE — ED Notes (Signed)
EXTERNAL CATH PLACED FOR URINE.

## 2020-02-07 NOTE — Consult Note (Signed)
Reason for Consult: syncope vs seizure activity  Requesting Physician: Dr. Clementeen Graham   CC: seizure vs syncope activity.   HPI: [name] is an 24 y.o. female with medical history significant for depression who presented to the emergency room initially with complaints of sinus congestion.  While at home today she had an episode where she lost consciousness but did not fall or hurt herself suspected due to syncope.  While awaiting evaluation in the emergency room she had another episode in the hallway. Pt had shaking episode. Pt states she was awake but voice were distant with difficulty replying and speaking. No post ictal state, urinary incontinence, or tongue biting.    Past Medical History:  Diagnosis Date  . Anxiety   . Depression   . Gastritis   . Heart murmur   . Sickle cell trait [facility])     Past Surgical History:  Procedure Laterality Date  . NO PAST SURGERIES      Family History  Problem Relation Age of Onset  . Hypertension Maternal Grandmother   . Healthy Mother   . Healthy Father     Social History:  reports that she has never smoked. She has never used smokeless tobacco. She reports previous alcohol use. She reports that she does not use drugs.  Allergies  Allergen Reactions  . Peanuts [Peanut Oil] Anaphylaxis, Hives and Swelling    Tree nuts also   . Mushroom Extract Complex     Medications: I have reviewed the patient's current medications.  ROS: History obtained from the patient  General ROS: negative for - chills, fatigue, fever, night sweats, weight gain or weight loss Psychological ROS: Hx of depression  ENT ROS: negative for - epistaxis, nasal discharge, oral lesions, sore throat, tinnitus or vertigo Allergy and Immunology ROS: negative for - hives or itchy/watery eyes Hematological and Lymphatic ROS: negative for - bleeding problems, bruising or swollen lymph nodes Endocrine ROS: negative for - galactorrhea, hair pattern changes,  polydipsia/polyuria or temperature intolerance Respiratory ROS: negative for - cough, hemoptysis, shortness of breath or wheezing Cardiovascular ROS: negative for - chest pain, dyspnea on exertion, edema or irregular heartbeat Gastrointestinal ROS: negative for - abdominal pain, diarrhea, hematemesis, nausea/vomiting or stool incontinence Genito-Urinary ROS: negative for - dysuria, hematuria, incontinence or urinary frequency/urgency Musculoskeletal ROS: negative for - joint swelling or muscular weakness Neurological ROS: as noted in HPI Dermatological ROS: negative for rash and skin lesion changes  Physical Examination: Blood pressure 123/67, pulse 73, temperature 98.7 F (37.1 C), temperature source Oral, resp. rate 11, height 5\' 10"  (1.778 m), weight 61.2 kg, SpO2 99 %, currently breastfeeding.   Neurological Examination   Mental Status: Alert, oriented, thought content appropriate.  Speech fluent without evidence of aphasia.  Able to follow 3 step commands without difficulty. Cranial Nerves: II: Discs flat bilaterally; Visual fields grossly normal, pupils equal, round, reactive to light and accommodation III,IV, VI: ptosis not present, extra-ocular motions intact bilaterally V,VII: smile symmetric, facial light touch sensation normal bilaterally VIII: hearing normal bilaterally IX,X: gag reflex present XI: bilateral shoulder shrug XII: midline tongue extension Motor: Right : Upper extremity   5/5    Left:     Upper extremity   5/5  Lower extremity   5/5     Lower extremity   5/5 Tone and bulk:normal tone throughout; no atrophy noted Sensory: Pinprick and light touch intact throughout, bilaterally Deep Tendon Reflexes: 2+ and symmetric throughout Plantars: Right: downgoing   Left: downgoing Cerebellar: normal finger-to-nose, normal  rapid alternating movements and normal heel-to-shin test Gait: not tested      Laboratory Studies:   Basic Metabolic Panel: Recent Labs  Lab  02/06/20 1946  NA 140  K 3.5  CL 107  CO2 27  GLUCOSE 94  BUN <5*  CREATININE 0.70  CALCIUM 9.1    Liver Function Tests: Recent Labs  Lab 02/06/20 1946  AST 17  ALT 15  ALKPHOS 70  BILITOT 0.6  PROT 7.1  ALBUMIN 3.9   No results for input(s): LIPASE, AMYLASE in the last 168 hours. No results for input(s): AMMONIA in the last 168 hours.  CBC: Recent Labs  Lab 02/06/20 1946  WBC 5.8  NEUTROABS 3.3  HGB 12.6  HCT 36.3  MCV 81.4  PLT 174    Cardiac Enzymes: No results for input(s): CKTOTAL, CKMB, CKMBINDEX, TROPONINI in the last 168 hours.  BNP: Invalid input(s): POCBNP  CBG: Recent Labs  Lab 02/06/20 2220  GLUCAP 86    Microbiology: Results for orders placed or performed during the hospital encounter of 02/06/20  SARS Coronavirus 2 by RT PCR (hospital order, performed in [facility] hospital lab) Nasopharyngeal Nasopharyngeal Swab     Status: None   Collection Time: 02/06/20  8:27 PM   Specimen: Nasopharyngeal Swab  Result Value Ref Range Status   SARS Coronavirus 2 NEGATIVE NEGATIVE Final    Comment: (NOTE) SARS-CoV-2 target nucleic acids are NOT DETECTED.  The SARS-CoV-2 RNA is generally detectable in upper and lower respiratory specimens during the acute phase of infection. The lowest concentration of SARS-CoV-2 viral copies this assay can detect is 250 copies / mL. A negative result does not preclude SARS-CoV-2 infection and should not be used as the sole basis for treatment or other patient management decisions.  A negative result may occur with improper specimen collection / handling, submission of specimen other than nasopharyngeal swab, presence of viral mutation(s) within the areas targeted by this assay, and inadequate number of viral copies (<250 copies / mL). A negative result must be combined with clinical observations, patient history, and epidemiological information.  Fact Sheet for Patients:    BoilerBrush.com.cy  Fact Sheet for Healthcare Providers: https://pope.com/  This test is not yet approved or  cleared by the [place] FDA and has been authorized for detection and/or diagnosis of SARS-CoV-2 by FDA under an Emergency Use Authorization (EUA).  This EUA will remain in effect (meaning this test can be used) for the duration of the COVID-19 declaration under Section 564(b)(1) of the Act, 21 U.S.C. section 360bbb-3(b)(1), unless the authorization is terminated or revoked sooner.  Performed at [facility], [address] [place], [place] [address]     Coagulation Studies: No results for input(s): LABPROT, INR in the last 72 hours.  Urinalysis:  Recent Labs  Lab 02/06/20 2031  COLORURINE YELLOW*  LABSPEC 1.006  PHURINE 7.0  GLUCOSEU NEGATIVE  HGBUR NEGATIVE  BILIRUBINUR NEGATIVE  KETONESUR NEGATIVE  PROTEINUR NEGATIVE  NITRITE NEGATIVE  LEUKOCYTESUR NEGATIVE    Lipid Panel:     Component Value Date/Time   CHOL 117 12/01/2019 1753   CHOL 123 11/12/2019 1433   TRIG 45 12/01/2019 1753   HDL 55 12/01/2019 1753   HDL 61 11/12/2019 1433   CHOLHDL 2.1 12/01/2019 1753   VLDL 9 12/01/2019 1753   LDLCALC 53 12/01/2019 1753   LDLCALC 49 11/12/2019 1433   LDLCALC 75 08/15/2017 1113    HgbA1C:  Lab Results  Component Value Date   HGBA1C  5.3 12/01/2019    Urine Drug Screen:      Component Value Date/Time   LABOPIA NONE DETECTED 02/06/2020 2031   COCAINSCRNUR NONE DETECTED 02/06/2020 2031   LABBENZ NONE DETECTED 02/06/2020 2031   AMPHETMU NONE DETECTED 02/06/2020 2031   THCU NONE DETECTED 02/06/2020 2031   LABBARB NONE DETECTED 02/06/2020 2031    Alcohol Level: No results for input(s): ETH in the last 168 hours.  Other results: EKG: normal EKG, normal sinus rhythm, unchanged from previous tracings.  Imaging: DG Chest 2 View  Result Date: 02/06/2020 CLINICAL DATA:  Dry cough EXAM:  CHEST - 2 VIEW COMPARISON:  07/18/2017 FINDINGS: The heart size and mediastinal contours are within normal limits. Both lungs are clear. The visualized skeletal structures are unremarkable. IMPRESSION: No active cardiopulmonary disease. Electronically Signed   By: Charlett Nose M.D.   On: 02/06/2020 19:14   CT Head Wo Contrast  Result Date: 02/06/2020 CLINICAL DATA:  Syncope EXAM: CT HEAD WITHOUT CONTRAST TECHNIQUE: Contiguous axial images were obtained from the base of the skull through the vertex without intravenous contrast. COMPARISON:  None. FINDINGS: Brain: No acute intracranial abnormality. Specifically, no hemorrhage, hydrocephalus, mass lesion, acute infarction, or significant intracranial injury. Vascular: No hyperdense vessel or unexpected calcification. Skull: No acute calvarial abnormality. Sinuses/Orbits: Marland Kitchen Mucosal thickening No air-fluid levels. Throughout the paranasal sinuses Other: None IMPRESSION: No intracranial abnormality. Chronic sinusitis. Electronically Signed   By: Charlett Nose M.D.   On: 02/06/2020 23:29   CT Angio Chest PE W/Cm &/Or Wo Cm  Result Date: 02/06/2020 CLINICAL DATA:  Shortness of breath and recent syncopal episode with cough EXAM: CT ANGIOGRAPHY CHEST WITH CONTRAST TECHNIQUE: Multidetector CT imaging of the chest was performed using the standard protocol during bolus administration of intravenous contrast. Multiplanar CT image reconstructions and MIPs were obtained to evaluate the vascular anatomy. CONTRAST:  97mL OMNIPAQUE IOHEXOL 350 MG/ML SOLN COMPARISON:  None. FINDINGS: Cardiovascular: Thoracic aorta and its branches are within normal limits. No aneurysmal dilatation or dissection is seen. The heart is not significantly enlarged. The pulmonary artery shows a normal branching pattern. No focal filling defect is identified to suggest pulmonary embolism. No significant coronary calcifications are noted. Mediastinum/Nodes: Thoracic inlet is within normal limits. No  hilar or mediastinal adenopathy is noted. The esophagus as visualized is within normal limits. Lungs/Pleura: Lungs are well aerated bilaterally. No focal infiltrate or sizable effusion is seen. Upper Abdomen: Visualized upper abdomen shows no focal abnormality. Musculoskeletal: No bony structures show no acute abnormality. Review of the MIP images confirms the above findings. IMPRESSION: No evidence of pulmonary emboli. No acute abnormality noted. Electronically Signed   By: Alcide Clever M.D.   On: 02/06/2020 23:31     Assessment/Plan:   24 y.o. female with medical history significant for depression who presented to the emergency room initially with complaints of sinus congestion.  While at home today she had an episode where she lost consciousness but did not fall or hurt herself suspected due to syncope.  While awaiting evaluation in the emergency room she had another episode in the hallway. Pt had shaking episode. Pt states she was awake but voice were distant with difficulty replying and speaking. No post ictal state, urinary incontinence, or tongue biting.   - Not consistent with seizure activity.  - would not start anti epileptics at this time  - EEG pending - syncope work up with possible orthostatics - CTH normal - Pt is claustrophobic so likely no MRI on this admission.  02/07/2020, 10:55 AM

## 2020-02-07 NOTE — ED Notes (Signed)
Report to jenna, rn.  

## 2020-02-07 NOTE — Progress Notes (Signed)
eeg completed ° °

## 2020-02-08 ENCOUNTER — Observation Stay
Admit: 2020-02-08 | Discharge: 2020-02-08 | Disposition: A | Discharge status: Home / Self Care | Payer: 59 | Attending: Internal Medicine | Admitting: Internal Medicine

## 2020-02-08 DIAGNOSIS — R569 Unspecified convulsions: Secondary | ICD-10-CM | POA: Diagnosis not present

## 2020-02-08 DIAGNOSIS — F332 Major depressive disorder, recurrent severe without psychotic features: Secondary | ICD-10-CM | POA: Diagnosis not present

## 2020-02-08 DIAGNOSIS — F419 Anxiety disorder, unspecified: Secondary | ICD-10-CM | POA: Diagnosis not present

## 2020-02-08 LAB — ECHOCARDIOGRAM COMPLETE
Height: 70 in
Weight: 2160 oz

## 2020-02-08 LAB — GLUCOSE, CAPILLARY
Glucose-Capillary: 62 mg/dL — ABNORMAL LOW (ref 70–99)
Glucose-Capillary: 75 mg/dL (ref 70–99)

## 2020-02-08 MED ORDER — SUCRALFATE 1 GM/10ML PO SUSP
1.0000 g | Freq: Three times a day (TID) | ORAL | Status: DC
  Administered 2020-02-08 – 2020-02-09 (×4): 1 g via ORAL
  Filled 2020-02-08 (×4): qty 10

## 2020-02-08 MED ORDER — FAMOTIDINE 20 MG PO TABS
20.0000 mg | ORAL_TABLET | Freq: Two times a day (BID) | ORAL | Status: DC
  Administered 2020-02-08 – 2020-02-09 (×3): 20 mg via ORAL
  Filled 2020-02-08 (×3): qty 1

## 2020-02-08 MED ORDER — GUAIFENESIN-DM 100-10 MG/5ML PO SYRP
5.0000 mL | ORAL_SOLUTION | ORAL | Status: DC | PRN
  Administered 2020-02-08 (×2): 5 mL via ORAL
  Filled 2020-02-08 (×2): qty 5

## 2020-02-08 NOTE — Progress Notes (Signed)
Subjective: Pt is improved, appears anxious but no focal abnormalities.   Past Medical History:  Diagnosis Date  . Anxiety   . Depression   . Gastritis   . Heart murmur   . Sickle cell trait [facility])     Past Surgical History:  Procedure Laterality Date  . NO PAST SURGERIES      Family History  Problem Relation Age of Onset  . Hypertension Maternal Grandmother   . Healthy Mother   . Healthy Father     Social History:  reports that she has never smoked. She has never used smokeless tobacco. She reports previous alcohol use. She reports that she does not use drugs.  Allergies  Allergen Reactions  . Peanuts [Peanut Oil] Anaphylaxis, Hives and Swelling    Tree nuts also   . Mushroom Extract Complex     Medications: I have reviewed the patient's current medications.   Physical Examination: Blood pressure 104/62, pulse 68, temperature 98.1 F (36.7 C), temperature source Oral, resp. rate 16, height 5\' 10"  (1.778 m), weight 61.2 kg, SpO2 98 %, currently breastfeeding.   Neurological Examination   Mental Status: Alert, oriented, thought content appropriate.  Speech fluent without evidence of aphasia.  Able to follow 3 step commands without difficulty. Cranial Nerves: II: Discs flat bilaterally; Visual fields grossly normal, pupils equal, round, reactive to light and accommodation III,IV, VI: ptosis not present, extra-ocular motions intact bilaterally V,VII: smile symmetric, facial light touch sensation normal bilaterally VIII: hearing normal bilaterally IX,X: gag reflex present XI: bilateral shoulder shrug XII: midline tongue extension Motor: Right : Upper extremity   5/5    Left:     Upper extremity   5/5  Lower extremity   5/5     Lower extremity   5/5 Tone and bulk:normal tone throughout; no atrophy noted Sensory: Pinprick and light touch intact throughout, bilaterally Deep Tendon Reflexes: 2+ and symmetric throughout Plantars: Right: downgoing   Left:  downgoing Cerebellar: normal finger-to-nose, normal rapid alternating movements and normal heel-to-shin test Gait: not tested      Laboratory Studies:   Basic Metabolic Panel: Recent Labs  Lab 02/06/20 1946  NA 140  K 3.5  CL 107  CO2 27  GLUCOSE 94  BUN <5*  CREATININE 0.70  CALCIUM 9.1    Liver Function Tests: Recent Labs  Lab 02/06/20 1946  AST 17  ALT 15  ALKPHOS 70  BILITOT 0.6  PROT 7.1  ALBUMIN 3.9   No results for input(s): LIPASE, AMYLASE in the last 168 hours. No results for input(s): AMMONIA in the last 168 hours.  CBC: Recent Labs  Lab 02/06/20 1946  WBC 5.8  NEUTROABS 3.3  HGB 12.6  HCT 36.3  MCV 81.4  PLT 174    Cardiac Enzymes: Recent Labs  Lab 02/06/20 2233  CKTOTAL 148    BNP: Invalid input(s): POCBNP  CBG: Recent Labs  Lab 02/06/20 2220 02/08/20 1117  GLUCAP 86 62*    Microbiology: Results for orders placed or performed during the hospital encounter of 02/06/20  SARS Coronavirus 2 by RT PCR (hospital order, performed in [facility] hospital lab) Nasopharyngeal Nasopharyngeal Swab     Status: None   Collection Time: 02/06/20  8:27 PM   Specimen: Nasopharyngeal Swab  Result Value Ref Range Status   SARS Coronavirus 2 NEGATIVE NEGATIVE Final    Comment: (NOTE) SARS-CoV-2 target nucleic acids are NOT DETECTED.  The SARS-CoV-2 RNA is generally detectable in upper and lower respiratory specimens during the  acute phase of infection. The lowest concentration of SARS-CoV-2 viral copies this assay can detect is 250 copies / mL. A negative result does not preclude SARS-CoV-2 infection and should not be used as the sole basis for treatment or other patient management decisions.  A negative result may occur with improper specimen collection / handling, submission of specimen other than nasopharyngeal swab, presence of viral mutation(s) within the areas targeted by this assay, and inadequate number of viral copies (<250 copies  / mL). A negative result must be combined with clinical observations, patient history, and epidemiological information.  Fact Sheet for Patients:   BoilerBrush.com.cy  Fact Sheet for Healthcare Providers: https://pope.com/  This test is not yet approved or  cleared by the [place] FDA and has been authorized for detection and/or diagnosis of SARS-CoV-2 by FDA under an Emergency Use Authorization (EUA).  This EUA will remain in effect (meaning this test can be used) for the duration of the COVID-19 declaration under Section 564(b)(1) of the Act, 21 U.S.C. section 360bbb-3(b)(1), unless the authorization is terminated or revoked sooner.  Performed at [facility], [address] [place], [place] [address]     Coagulation Studies: No results for input(s): LABPROT, INR in the last 72 hours.  Urinalysis:  Recent Labs  Lab 02/06/20 2031  COLORURINE YELLOW*  LABSPEC 1.006  PHURINE 7.0  GLUCOSEU NEGATIVE  HGBUR NEGATIVE  BILIRUBINUR NEGATIVE  KETONESUR NEGATIVE  PROTEINUR NEGATIVE  NITRITE NEGATIVE  LEUKOCYTESUR NEGATIVE    Lipid Panel:     Component Value Date/Time   CHOL 117 12/01/2019 1753   CHOL 123 11/12/2019 1433   TRIG 45 12/01/2019 1753   HDL 55 12/01/2019 1753   HDL 61 11/12/2019 1433   CHOLHDL 2.1 12/01/2019 1753   VLDL 9 12/01/2019 1753   LDLCALC 53 12/01/2019 1753   LDLCALC 49 11/12/2019 1433   LDLCALC 75 08/15/2017 1113    HgbA1C:  Lab Results  Component Value Date   HGBA1C 5.3 12/01/2019    Urine Drug Screen:      Component Value Date/Time   LABOPIA NONE DETECTED 02/06/2020 2031   COCAINSCRNUR NONE DETECTED 02/06/2020 2031   LABBENZ NONE DETECTED 02/06/2020 2031   AMPHETMU NONE DETECTED 02/06/2020 2031   THCU NONE DETECTED 02/06/2020 2031   LABBARB NONE DETECTED 02/06/2020 2031    Alcohol Level: No results for input(s): ETH in the last 168 hours.  Other results: EKG: normal  EKG, normal sinus rhythm, unchanged from previous tracings.  Imaging: EEG  Result Date: 02/07/2020 Charlsie Quest, MD     02/07/2020  5:47 PM Patient Name: [name] MRN: [id number] Epilepsy Attending: Charlsie Quest Referring Physician/Provider: Dr. Lindajo Royal Date: 02/07/2019 Duration: 22.09 mins Patient history: 24 year old female noted to have shaking episode concerning for seizures.  EEG evaluate for seizures. Level of alertness: Awake, asleep AEDs during EEG study: None Technical aspects: This EEG study was done with scalp electrodes positioned according to the 10-20 International system of electrode placement. Electrical activity was acquired at a sampling rate of 500Hz  and reviewed with a high frequency filter of 70Hz  and a low frequency filter of 1Hz . EEG data were recorded continuously and digitally stored. Description: The posterior dominant rhythm consists of 11 Hz activity of moderate voltage (25-35 uV) seen predominantly in posterior head regions, symmetric and reactive to eye opening and eye closing. .Sleep was characterized by vertex waves, sleep spindles (12 to 14 Hz), maximal frontocentral region. Physiology photic driving was not seen during  photic stimulation.  Hyperventilation was not performed.   One event was recorded during this study where patient was noted to have head bobbing, not responding to questions after photic stimulation at 15Hz , Concomitant eeg before, during and after the event didn't show any eeg change to suggest seizure. IMPRESSION: This study is within normal limits. No seizures or epileptiform discharges were seen throughout the recording. One event of head bobbing and unresponsiveness was recorded during this study without concomitant eeg change was NOT epileptic.   DG Chest 2 View  Result Date: 02/06/2020 CLINICAL DATA:  Dry cough EXAM: CHEST - 2 VIEW COMPARISON:  07/18/2017 FINDINGS: The heart size and mediastinal contours are within  normal limits. Both lungs are clear. The visualized skeletal structures are unremarkable. IMPRESSION: No active cardiopulmonary disease. Electronically Signed   By: 07/20/2017 M.D.   On: 02/06/2020 19:14   CT Head Wo Contrast  Result Date: 02/06/2020 CLINICAL DATA:  Syncope EXAM: CT HEAD WITHOUT CONTRAST TECHNIQUE: Contiguous axial images were obtained from the base of the skull through the vertex without intravenous contrast. COMPARISON:  None. FINDINGS: Brain: No acute intracranial abnormality. Specifically, no hemorrhage, hydrocephalus, mass lesion, acute infarction, or significant intracranial injury. Vascular: No hyperdense vessel or unexpected calcification. Skull: No acute calvarial abnormality. Sinuses/Orbits: 02/08/2020 Mucosal thickening No air-fluid levels. Throughout the paranasal sinuses Other: None IMPRESSION: No intracranial abnormality. Chronic sinusitis. Electronically Signed   By: Marland Kitchen M.D.   On: 02/06/2020 23:29   CT Angio Chest PE W/Cm &/Or Wo Cm  Result Date: 02/06/2020 CLINICAL DATA:  Shortness of breath and recent syncopal episode with cough EXAM: CT ANGIOGRAPHY CHEST WITH CONTRAST TECHNIQUE: Multidetector CT imaging of the chest was performed using the standard protocol during bolus administration of intravenous contrast. Multiplanar CT image reconstructions and MIPs were obtained to evaluate the vascular anatomy. CONTRAST:  25mL OMNIPAQUE IOHEXOL 350 MG/ML SOLN COMPARISON:  None. FINDINGS: Cardiovascular: Thoracic aorta and its branches are within normal limits. No aneurysmal dilatation or dissection is seen. The heart is not significantly enlarged. The pulmonary artery shows a normal branching pattern. No focal filling defect is identified to suggest pulmonary embolism. No significant coronary calcifications are noted. Mediastinum/Nodes: Thoracic inlet is within normal limits. No hilar or mediastinal adenopathy is noted. The esophagus as visualized is within normal limits.  Lungs/Pleura: Lungs are well aerated bilaterally. No focal infiltrate or sizable effusion is seen. Upper Abdomen: Visualized upper abdomen shows no focal abnormality. Musculoskeletal: No bony structures show no acute abnormality. Review of the MIP images confirms the above findings. IMPRESSION: No evidence of pulmonary emboli. No acute abnormality noted. Electronically Signed   By: 72m M.D.   On: 02/06/2020 23:31     Assessment/Plan:   24 y.o. female with medical history significant for depression who presented to the emergency room initially with complaints of sinus congestion.  While at home today she had an episode where she lost consciousness but did not fall or hurt herself suspected due to syncope.  While awaiting evaluation in the emergency room she had another episode in the hallway. Pt had shaking episode. Pt states she was awake but voice were distant with difficulty replying and speaking. No post ictal state, urinary incontinence, or tongue biting.   - EEG no epileptiform activity and episodes are non-epileptic/stress related as she had 1-2 during EEG recordings with normal EEG - that has been explained to patient and she appears to understand - She has increased anxiety.  -  When asked about psychiatric evaluation, she did state she would like to have one in the hospital, not sure if needed while in patient but definitely needs one on follow up - Echo pending - suspect d/c planning from neurological stand point - pt is aware these are not epileptic events and no need for anti epileptics.

## 2020-02-08 NOTE — Progress Notes (Signed)
At 2320, this RN received a call from telemetry stating pt's HR was in the 150s. Upon entering room, pt was having an episode that appeared to be body convulsions. Her body was jerking and bouncing off the bed, eyes rolling to the back of the head. After the episode, pt was A=Ox4, not appearing postictal. VSS. Neuro check was normal. Pt reports she was feeling "hot" and like she was "floating" and stated her vision started to go black before the episode. After the episode, this RN had a long discussion with the patient, in which she reports that she has a history of alcohol abuse and relapsed two weeks ago, consuming significant amounts of alcohol one night. She also stated that she has missed a few of her zoloft within the last few weeks. She wanted this information passed along incase it was pertinent to her care and could be a cause of these episodes. Pt requested that her mother NOT be made aware of the relapse episode. This RN states she has a right to privacy and her mother will not be made aware of this information. Hospitalist notified of episode and conversation with patient, no interventions.

## 2020-02-08 NOTE — Progress Notes (Signed)
PROGRESS NOTE    [name]  ZOX:[id number] DOB: 1995-12-25 DOA: 02/06/2020 PCP: [name]    Chief Complaint  Patient presents with  . Cough  . Generalized Body Aches    Brief Narrative: 24 year old female with depression presented with recent history of sinus congestion sore throat with episode of loss of consciousness.  While in the ED she had a similar episode with suspected seizure-like activity.  Blood work done in the ED showed elevated lactic acid of 3.4.  Patient also complained of palpitations for which CT angiogram of the chest was done which was negative for PE. Placed in observation for further management. While on the floor next morning she had jerky movements lasting for about 30 seconds, resolved without intervention.  Prior to that the heart rate had gone up to 180s.  Patient reported feeling dizzy.   Assessment & Plan:   Principal Problem: ?seizure-like activity (HCC) Seizure ruled out with negative EEG.  Patient having multiple episodes where she was her eyes and becomes rigid.  Discussed with neurology and this does not appear to be seizures and concern for conversion disorder/severe anxiety. Patient reported having a lot of stress and has a psychiatry evaluation 2 months later for her anxiety/depression.  I offered her psychiatry consult as inpatient but she declined and when I told her I will be discharging her home if her 2D echo was normal shortly see had seizure-like activity and told the nurse that she was feeling suicidal. Recruitment consultantafety sitter ordered and psych consulted.  No Ativan to be administered for any seizure-like activity.   Active Problems: Sinus tachycardia Orthostasis negative.  TSH normal.  2D echo with normal EF, shows trivial MR but no other findings.  Elevated lactic acid Possibly due to dehydration.  Resolved.  Depression Per mother she was recently started on fluoxetine in the ED.  We will continue for now and await  psychiatry consult.  Mitral regurgitation Seen on 2D echo.  No correlation with her current symptoms.  Follow as outpatient.   DVT prophylaxis: Subcu Lovenox Code Status: Full code Family Communication: Mother at bedside Disposition:   Status is: Observation  The patient remains OBS appropriate and will d/c before 2 midnights.  Dispo: The patient is from: Home              Anticipated d/c is to: Home              Anticipated d/c date is: 1 day              Patient currently is not medically stable to d/c.  Needs psych clearance for discharge.       Consultants:   Neurology   Procedures: CT head, EEG, 2D echo   Antimicrobials: None   Subjective: Seen and examined.  Episodes of seizure-like activity throughout the day.  Patient tearful and reported being suicidal this afternoon.  Objective: Vitals:   02/08/20 0730 02/08/20 1100 02/08/20 1359 02/08/20 1539  BP: 104/62 115/76 126/75 (!) 131/54  Pulse:   76 83  Resp: 16 18  15   Temp: 98.1 F (36.7 C) 98.2 F (36.8 C)    TempSrc: Oral Oral    SpO2: 98% 98%  100%  Weight:      Height:        Intake/Output Summary (Last 24 hours) at 02/08/2020 1744 Last data filed at 02/07/2020 1830 Gross per 24 hour  Intake 120 ml  Output --  Net 120 ml   Ceasar MonsFiled  Weights   02/06/20 1831  Weight: 61.2 kg    Examination:  General: Young female, not in distress, tearful HEENT: Moist mucosa, supple neck Chest: Clear to auscultation bilaterally, no added sound CVs: Normal S1-S2, GI: Soft, nondistended, nontender Musculoskeletal: Warm, no edema    Data Reviewed: I have personally reviewed following labs and imaging studies  CBC: Recent Labs  Lab 02/06/20 1946  WBC 5.8  NEUTROABS 3.3  HGB 12.6  HCT 36.3  MCV 81.4  PLT 413    Basic Metabolic Panel: Recent Labs  Lab 02/06/20 1946  NA 140  K 3.5  CL 107  CO2 27  GLUCOSE 94  BUN <5*  CREATININE 0.70  CALCIUM 9.1    GFR: Estimated Creatinine Clearance:  104.8 mL/min (by C-G formula based on SCr of 0.7 mg/dL).  Liver Function Tests: Recent Labs  Lab 02/06/20 1946  AST 17  ALT 15  ALKPHOS 70  BILITOT 0.6  PROT 7.1  ALBUMIN 3.9    CBG: Recent Labs  Lab 02/06/20 2220 02/08/20 1117 02/08/20 1355  GLUCAP 86 62* 75     Recent Results (from the past 240 hour(s))  SARS Coronavirus 2 by RT PCR (hospital order, performed in [facility] hospital lab) Nasopharyngeal Nasopharyngeal Swab     Status: None   Collection Time: 02/06/20  8:27 PM   Specimen: Nasopharyngeal Swab  Result Value Ref Range Status   SARS Coronavirus 2 NEGATIVE NEGATIVE Final    Comment: (NOTE) SARS-CoV-2 target nucleic acids are NOT DETECTED.  The SARS-CoV-2 RNA is generally detectable in upper and lower respiratory specimens during the acute phase of infection. The lowest concentration of SARS-CoV-2 viral copies this assay can detect is 250 copies / mL. A negative result does not preclude SARS-CoV-2 infection and should not be used as the sole basis for treatment or other patient management decisions.  A negative result may occur with improper specimen collection / handling, submission of specimen other than nasopharyngeal swab, presence of viral mutation(s) within the areas targeted by this assay, and inadequate number of viral copies (<250 copies / mL). A negative result must be combined with clinical observations, patient history, and epidemiological information.  Fact Sheet for Patients:   StrictlyIdeas.no  Fact Sheet for Healthcare Providers: BankingDealers.co.za  This test is not yet approved or  cleared by the [place] FDA and has been authorized for detection and/or diagnosis of SARS-CoV-2 by FDA under an Emergency Use Authorization (EUA).  This EUA will remain in effect (meaning this test can be used) for the duration of the COVID-19 declaration under Section 564(b)(1) of the Act, 21  U.S.C. section 360bbb-3(b)(1), unless the authorization is terminated or revoked sooner.  Performed at [facility], [address], [place], [place] [address]          Radiology Studies: EEG  Result Date: 02/07/2020 Gabriella Havens, MD     02/07/2020  5:47 PM Patient Name: [name] MRN: [id number] Epilepsy Attending: Lora Perkins Referring Physician/Provider: Dr. Judd Gaudier Date: 02/07/2019 Duration: 22.09 mins Patient history: 24 year old female noted to have shaking episode concerning for seizures.  EEG evaluate for seizures. Level of alertness: Awake, asleep AEDs during EEG study: None Technical aspects: This EEG study was done with scalp electrodes positioned according to the 10-20 International system of electrode placement. Electrical activity was acquired at a sampling rate of 500Hz  and reviewed with a high frequency filter of 70Hz  and a low frequency filter of 1Hz . EEG data were recorded  continuously and digitally stored. Description: The posterior dominant rhythm consists of 11 Hz activity of moderate voltage (25-35 uV) seen predominantly in posterior head regions, symmetric and reactive to eye opening and eye closing. .Sleep was characterized by vertex waves, sleep spindles (12 to 14 Hz), maximal frontocentral region. Physiology photic driving was not seen during photic stimulation.  Hyperventilation was not performed.   One event was recorded during this study where patient was noted to have head bobbing, not responding to questions after photic stimulation at 15Hz , Concomitant eeg before, during and after the event didn't show any eeg change to suggest seizure. IMPRESSION: This study is within normal limits. No seizures or epileptiform discharges were seen throughout the recording. One event of head bobbing and unresponsiveness was recorded during this study without concomitant eeg change was NOT epileptic.   DG Chest 2 View  Result Date:  02/06/2020 CLINICAL DATA:  Dry cough EXAM: CHEST - 2 VIEW COMPARISON:  07/18/2017 FINDINGS: The heart size and mediastinal contours are within normal limits. Both lungs are clear. The visualized skeletal structures are unremarkable. IMPRESSION: No active cardiopulmonary disease. Electronically Signed   By: 07/20/2017 M.D.   On: 02/06/2020 19:14   CT Head Wo Contrast  Result Date: 02/06/2020 CLINICAL DATA:  Syncope EXAM: CT HEAD WITHOUT CONTRAST TECHNIQUE: Contiguous axial images were obtained from the base of the skull through the vertex without intravenous contrast. COMPARISON:  None. FINDINGS: Brain: No acute intracranial abnormality. Specifically, no hemorrhage, hydrocephalus, mass lesion, acute infarction, or significant intracranial injury. Vascular: No hyperdense vessel or unexpected calcification. Skull: No acute calvarial abnormality. Sinuses/Orbits: 02/08/2020 Mucosal thickening No air-fluid levels. Throughout the paranasal sinuses Other: None IMPRESSION: No intracranial abnormality. Chronic sinusitis. Electronically Signed   By: Marland Kitchen M.D.   On: 02/06/2020 23:29   CT Angio Chest PE W/Cm &/Or Wo Cm  Result Date: 02/06/2020 CLINICAL DATA:  Shortness of breath and recent syncopal episode with cough EXAM: CT ANGIOGRAPHY CHEST WITH CONTRAST TECHNIQUE: Multidetector CT imaging of the chest was performed using the standard protocol during bolus administration of intravenous contrast. Multiplanar CT image reconstructions and MIPs were obtained to evaluate the vascular anatomy. CONTRAST:  63mL OMNIPAQUE IOHEXOL 350 MG/ML SOLN COMPARISON:  None. FINDINGS: Cardiovascular: Thoracic aorta and its branches are within normal limits. No aneurysmal dilatation or dissection is seen. The heart is not significantly enlarged. The pulmonary artery shows a normal branching pattern. No focal filling defect is identified to suggest pulmonary embolism. No significant coronary calcifications are noted. Mediastinum/Nodes:  Thoracic inlet is within normal limits. No hilar or mediastinal adenopathy is noted. The esophagus as visualized is within normal limits. Lungs/Pleura: Lungs are well aerated bilaterally. No focal infiltrate or sizable effusion is seen. Upper Abdomen: Visualized upper abdomen shows no focal abnormality. Musculoskeletal: No bony structures show no acute abnormality. Review of the MIP images confirms the above findings. IMPRESSION: No evidence of pulmonary emboli. No acute abnormality noted. Electronically Signed   By: 72m M.D.   On: 02/06/2020 23:31   ECHOCARDIOGRAM COMPLETE  Result Date: 02/08/2020    ECHOCARDIOGRAM REPORT   Patient Name:   [name] Date of Exam: 02/08/2020 Medical Rec #:  02/10/2020        Height:       70.0 in Accession #:    [id number]       Weight:       135.0 lb Date of Birth:  [birth year]       BSA:  1.766 m Patient Age:    23 years         BP:           104/62 mmHg Patient Gender: F                HR:           62 bpm. Exam Location:  [facility] Procedure: 2D Echo, Color Doppler and Cardiac Doppler Indications:     R55 Syncope  History:         Patient has no prior history of Echocardiogram examinations.  Sonographer:     Humphrey Rolls RDCS (AE) Referring Phys:  [phone] [facility] Diagnosing Phys: Harold Hedge MD IMPRESSIONS  1. Left ventricular ejection fraction, by estimation, is 60 to 65%. Left ventricular ejection fraction by PLAX is 61 %. The left ventricle has normal function. The left ventricle has no regional wall motion abnormalities. Left ventricular diastolic parameters were normal.  2. Right ventricular systolic function is normal. The right ventricular size is mildly enlarged. There is normal pulmonary artery systolic pressure.  3. The mitral valve is grossly normal. Trivial mitral valve regurgitation.  4. The aortic valve is tricuspid. Aortic valve regurgitation is not visualized. FINDINGS  Left Ventricle: Left ventricular ejection fraction, by estimation, is  60 to 65%. Left ventricular ejection fraction by PLAX is 61 %. The left ventricle has normal function. The left ventricle has no regional wall motion abnormalities. The left ventricular internal cavity size was normal in size. There is borderline left ventricular hypertrophy. Left ventricular diastolic parameters were normal. Right Ventricle: The right ventricular size is mildly enlarged. No increase in right ventricular wall thickness. Right ventricular systolic function is normal. There is normal pulmonary artery systolic pressure. The tricuspid regurgitant velocity is 2.05  m/s, and with an assumed right atrial pressure of 10 mmHg, the estimated right ventricular systolic pressure is 26.8 mmHg. Left Atrium: Left atrial size was normal in size. Right Atrium: Right atrial size was normal in size. Pericardium: There is no evidence of pericardial effusion. Mitral Valve: The mitral valve is grossly normal. Trivial mitral valve regurgitation. MV peak gradient, 5.0 mmHg. The mean mitral valve gradient is 2.0 mmHg. Tricuspid Valve: The tricuspid valve is grossly normal. Tricuspid valve regurgitation is mild. Aortic Valve: The aortic valve is tricuspid. Aortic valve regurgitation is not visualized. Aortic valve mean gradient measures 5.0 mmHg. Aortic valve peak gradient measures 9.7 mmHg. Aortic valve area, by VTI measures 1.69 cm. Pulmonic Valve: The pulmonic valve was not well visualized. Pulmonic valve regurgitation is trivial. Aorta: The aortic root is normal in size and structure. IAS/Shunts: The interatrial septum was not assessed.  LEFT VENTRICLE PLAX 2D LV EF:         Left            Diastology                ventricular     LV e' lateral:   17.80 cm/s                ejection        LV E/e' lateral: 5.0                fraction by     LV e' medial:    12.70 cm/s                PLAX is 61      LV E/e' medial:  7.0                %.  LVIDd:         4.72 cm LVIDs:         3.19 cm LV PW:         0.92 cm LV IVS:         0.58 cm LVOT diam:     1.70 cm LV SV:         50 LV SV Index:   28 LVOT Area:     2.27 cm  RIGHT VENTRICLE RV Basal diam:  2.49 cm LEFT ATRIUM             Index       RIGHT ATRIUM           Index LA diam:        2.10 cm 1.19 cm/m  RA Area:     10.30 cm LA Vol (A2C):   31.6 ml 17.89 ml/m RA Volume:   21.50 ml  12.17 ml/m LA Vol (A4C):   26.2 ml 14.83 ml/m LA Biplane Vol: 31.0 ml 17.55 ml/m  AORTIC VALVE                    PULMONIC VALVE AV Area (Vmax):    1.60 cm     PV Vmax:       0.90 m/s AV Area (Vmean):   1.55 cm     PV Vmean:      64.000 cm/s AV Area (VTI):     1.69 cm     PV VTI:        0.195 m AV Vmax:           156.00 cm/s  PV Peak grad:  3.3 mmHg AV Vmean:          105.000 cm/s PV Mean grad:  2.0 mmHg AV VTI:            0.294 m AV Peak Grad:      9.7 mmHg AV Mean Grad:      5.0 mmHg LVOT Vmax:         110.00 cm/s LVOT Vmean:        71.600 cm/s LVOT VTI:          0.219 m LVOT/AV VTI ratio: 0.74  AORTA Ao Root diam: 2.80 cm MITRAL VALVE               TRICUSPID VALVE MV Area (PHT): 3.03 cm    TR Peak grad:   16.8 mmHg MV Peak grad:  5.0 mmHg    TR Vmax:        205.00 cm/s MV Mean grad:  2.0 mmHg MV Vmax:       1.12 m/s    SHUNTS MV Vmean:      68.6 cm/s   Systemic VTI:  0.22 m MV Decel Time: 250 msec    Systemic Diam: 1.70 cm MV E velocity: 89.20 cm/s MV A velocity: 55.80 cm/s MV E/A ratio:  1.60 Harold Hedge MD Electronically signed by Harold Hedge MD Signature Date/Time: 02/08/2020/12:20:00 PM    Final         Scheduled Meds: . enoxaparin (LOVENOX) injection  40 mg Subcutaneous Q24H  . famotidine  20 mg Oral BID  . fluticasone  2 spray Each Nare Daily  . sertraline  50 mg Oral Daily  . sucralfate  1 g Oral TID WC & HS   Continuous Infusions: . sodium chloride 75 mL/hr (02/07/20 1559)     LOS: 0 days    Time  spent: 25 minutes    Eddie North, MD [facility]   To contact the attending provider between 7A-7P or the covering provider during after hours 7P-7A,  please log into the web site [email or web address] and access using universal [place] password for that web site. If you do not have the password, please call the hospital operator.  02/08/2020, 5:44 PM

## 2020-02-08 NOTE — Progress Notes (Signed)
Touched base with patient's nurse Princess for follow up after earlier rapid response. Per report, patient has been able to wake up and actually eat a little since rapid response. Patient has had another episode with seizure appearing activity and was given a prn dose of ativan for that episode. RN has no further needs from the rapid response team at this time.

## 2020-02-08 NOTE — Significant Event (Signed)
Rapid Response Event Note  Overview: Time Called: 1402 Arrival Time: 1403 Event Type: Neurologic  Initial Focused Assessment:  Rapid response RN arrived in patient's room with patient appearing to be resting in bed with eyes closed. Visitor at bedside patting patient's cheek and asking her to wake up. Patient's RN and NT sitter at bedside with [facility] and RT. Per report from [facility] patient has had seizure like activity in the ED and in the patient's room just before rapid response RN arrived. Activity stopped with no medication administration by staff. Patient's visitor kept asking staff when patient would wake up and when staff would do something to make patient wake up. Initial vital signs were HR 90 regular, BP 131/74 MAP 89, oxygen saturations 100% on 2 L nasal cannula, RR 14 and unlabored. CBG 75 at 13:55. Patient not responding to stimuli by visitor but when arm lifted up by [facility] it hung suspended in the air for a second before it dropped. Per [facility] as well patient had slight flinch when nasal cannula placed in patient's nose before rapid response RN arrived.  Interventions: Gave brief explanation of post ictal state post seizure to patient's visitor. Explained that staff could not force patient to wake up but that patient's vital signs were currently stable. Patient had brief moment where she appeared to wake up, pulled off her nasal cannula, and sat up a little with eyes open. Lasted at most 30 seconds and then relaxed back into bed with eyes shut. Rechecked vital signs at 14:07 and HR 70 and regular, BP 118/80 MAP 92, oxygen saturations 100% on room air, RR 16 and unlabored.  Plan of Care (if not transferred): Patient's vital signs stable and patient to remain in 126 for now. Dr. Gonzella Lex was on the phone when rapid response RN left to speak with patient's visitor. Patient to keep sitter at bedside to ensure safety.  Event Summary: Name of Physician Notified: Dr. Gonzella Lex at 1402    at    Outcome: Stayed  in room and stabalized  Event End Time: [address], [facility] [place]

## 2020-02-08 NOTE — Progress Notes (Signed)
*  PRELIMINARY RESULTS* Echocardiogram 2D Echocardiogram has been performed.  [name] Azarian Starace 02/08/2020, 10:19 AM

## 2020-02-08 NOTE — Progress Notes (Signed)
Ch visited with Pt in response to RR. Pt was not responsive when CH arrived. Healthcare team let Ch know about Pt having had seizures and not responsive, but all vitals were fine. Pt's mom was trying to talk to Pt and caress Pt's face to get her up. Ch stayed with Pt and Pt's mom until she was responsive again, provided support in the form of prayer and presence. Edison Simon joined for a while towards the end of the visit to provide support as well.

## 2020-02-08 NOTE — Progress Notes (Signed)
This RN was notified by NT that pt had a convulsive episode at 0155, VSS, pt A+Ox4 on assessment.  During assessment, pt reported she was feeling hot and her eyes stared to roll into the back of her head and convulsions started at 0214. Episode lasted about 35 seconds. Pt A+Ox4 after episode, 2mg  ativan IV administered. Hospitalist made aware, who advised not to administer ativan again.

## 2020-02-09 DIAGNOSIS — F40240 Claustrophobia: Secondary | ICD-10-CM | POA: Diagnosis present

## 2020-02-09 DIAGNOSIS — R05 Cough: Secondary | ICD-10-CM | POA: Diagnosis present

## 2020-02-09 DIAGNOSIS — E872 Acidosis: Secondary | ICD-10-CM | POA: Diagnosis present

## 2020-02-09 DIAGNOSIS — F332 Major depressive disorder, recurrent severe without psychotic features: Secondary | ICD-10-CM

## 2020-02-09 DIAGNOSIS — I340 Nonrheumatic mitral (valve) insufficiency: Secondary | ICD-10-CM | POA: Diagnosis present

## 2020-02-09 DIAGNOSIS — F1010 Alcohol abuse, uncomplicated: Secondary | ICD-10-CM | POA: Diagnosis present

## 2020-02-09 DIAGNOSIS — Z91128 Patient's intentional underdosing of medication regimen for other reason: Secondary | ICD-10-CM | POA: Diagnosis not present

## 2020-02-09 DIAGNOSIS — Y92009 Unspecified place in unspecified non-institutional (private) residence as the place of occurrence of the external cause: Secondary | ICD-10-CM | POA: Diagnosis not present

## 2020-02-09 DIAGNOSIS — Z658 Other specified problems related to psychosocial circumstances: Secondary | ICD-10-CM | POA: Diagnosis not present

## 2020-02-09 DIAGNOSIS — Z733 Stress, not elsewhere classified: Secondary | ICD-10-CM | POA: Diagnosis not present

## 2020-02-09 DIAGNOSIS — Z79899 Other long term (current) drug therapy: Secondary | ICD-10-CM | POA: Diagnosis not present

## 2020-02-09 DIAGNOSIS — F445 Conversion disorder with seizures or convulsions: Secondary | ICD-10-CM | POA: Diagnosis present

## 2020-02-09 DIAGNOSIS — D573 Sickle-cell trait: Secondary | ICD-10-CM | POA: Diagnosis present

## 2020-02-09 DIAGNOSIS — J069 Acute upper respiratory infection, unspecified: Secondary | ICD-10-CM | POA: Diagnosis present

## 2020-02-09 DIAGNOSIS — F329 Major depressive disorder, single episode, unspecified: Secondary | ICD-10-CM | POA: Diagnosis present

## 2020-02-09 DIAGNOSIS — F419 Anxiety disorder, unspecified: Secondary | ICD-10-CM | POA: Diagnosis present

## 2020-02-09 DIAGNOSIS — T43206A Underdosing of unspecified antidepressants, initial encounter: Secondary | ICD-10-CM | POA: Diagnosis present

## 2020-02-09 DIAGNOSIS — R45851 Suicidal ideations: Secondary | ICD-10-CM | POA: Diagnosis present

## 2020-02-09 DIAGNOSIS — Z9114 Patient's other noncompliance with medication regimen: Secondary | ICD-10-CM | POA: Diagnosis not present

## 2020-02-09 DIAGNOSIS — R55 Syncope and collapse: Secondary | ICD-10-CM | POA: Diagnosis present

## 2020-02-09 DIAGNOSIS — Z20822 Contact with and (suspected) exposure to covid-19: Secondary | ICD-10-CM | POA: Diagnosis present

## 2020-02-09 NOTE — Consult Note (Signed)
[facility] Face-to-Face Psychiatry Consult   Reason for Consult:  Depression anxiety SI and possible stress related conversion issues from pseudoseizures  Referring Physician:   IM team  Patient Identification: [name] MRN:  [id number] Principal Diagnosis: Witnessed seizure-like activity (HCC) Diagnosis:  Principal Problem:   Witnessed seizure-like activity (HCC) Active Problems:   Depression   Syncope   URTI (acute upper respiratory infection)   Lactic acidosis   Recurrent seizures (HCC)   Total Time spent with patient: 45-55 minutes  CC  I want to go home   Subjective:   [name] is a 24 y.o. female patient admitted with syncopal episode and issues with seizures vs pseudoseizures.   Found to have pseudoseizures .and possible depression.  Was scheduled to go home but had vague thoughts of passive ideation of "not being here and feeling overwhelmed" she said.  At first did not want to see Psychiatrist then later decided to see someone  In the meantime she got impatient and superficially was scraping self with utensil.  No major injury and it was more attention seeking.   She has no previous treatment for psych but felt overwhelmed and had a near syncopal event with pseudo seizures  She is overwhelmed by kids and school and wants to seek ways to help with anxiety and depression and an outpatient  She has no imminent suicidal ideation hints and place so cannot be on IVC and does not want to go to Psychiatric unit at this time.  She contracts for safety  Spoke to Mom who says she was not told about the results of seizures vs. pseudoseizures but understands the anxiety and depressive component to her issues.  Mom does not feel like she is suicidal and was impatient for her to come home anyway.  No other history of mania, psychosis or substance drug and ETOH abuse and misuse.     HPI:  See above   Past Psychiatric History:  None formal  But recognizes need to cope with  stress and anxiety --has therapist appt Aug 9th she says   Risk to Self:  less minimal at this point  Risk to Others: none    Prior Inpatient Therapy:  none  Prior Outpatient Therapy:  none consistent  Education -college to begin again  this fall  Social --overwhelmed as noted above   Contractor and legal issues None   Family history --she says there is no psych family history      Past Medical History:  Past Medical History:  Diagnosis Date  . Anxiety   . Depression   . Gastritis   . Heart murmur   . Sickle cell trait [facility])     Past Surgical History:  Procedure Laterality Date  . NO PAST SURGERIES     Family History:  Family History  Problem Relation Age of Onset  . Hypertension Maternal Grandmother   . Healthy Mother   . Healthy Father    Family Psychiatric  History none noted for now  Social History:  Social History   Substance and Sexual Activity  Alcohol Use Not Currently   Comment: 11/29/2019: drank 1.5 bottles of wine and 1/2 bottle of vodka mixed withj juidce     Social History   Substance and Sexual Activity  Drug Use No    Social History   Socioeconomic History  . Marital status: Single    Spouse name: Not on file  . Number of children: 2  . Years of education: 15.5  .  Highest education level: Not on file  Occupational History  . Occupation: FULL TIME STUDENT    Comment: [employer]  Tobacco Use  . Smoking status: Never Smoker  . Smokeless tobacco: Never Used  Vaping Use  . Vaping Use: Former  . Devices: vaped every day until a week ago  Substance and Sexual Activity  . Alcohol use: Not Currently    Comment: 11/29/2019: drank 1.5 bottles of wine and 1/2 bottle of vodka mixed withj juidce  . Drug use: No  . Sexual activity: Not Currently    Birth control/protection: I.U.D.    Comment: Mirena   Other Topics Concern  . Not on file  Social History Narrative  . Not on file   Social Determinants of Health   Financial Resource Strain: Low Risk    . Difficulty of Paying Living Expenses: Not very hard  Food Insecurity: No Food Insecurity  . Worried About Programme researcher, broadcasting/film/video in the Last Year: Never true  . Ran Out of Food in the Last Year: Never true  Transportation Needs: No Transportation Needs  . Lack of Transportation (Medical): No  . Lack of Transportation (Non-Medical): No  Physical Activity: Inactive  . Days of Exercise per Week: 0 days  . Minutes of Exercise per Session: 0 min  Stress: Stress Concern Present  . Feeling of Stress : To some extent  Social Connections: Moderately Isolated  . Frequency of Communication with Friends and Family: Three times a week  . Frequency of Social Gatherings with Friends and Family: Three times a week  . Attends Religious Services: 1 to 4 times per year  . Active Member of Clubs or Organizations: No  . Attends Banker Meetings: Never  . Marital Status: Never married   Additional Social History:    Allergies:   Allergies  Allergen Reactions  . Peanuts [Peanut Oil] Anaphylaxis, Hives and Swelling    Tree nuts also   . Mushroom Extract Complex     Labs:  Results for orders placed or performed during the hospital encounter of 02/06/20 (from the past 48 hour(s))  Glucose, capillary     Status: Abnormal   Collection Time: 02/08/20 11:17 AM  Result Value Ref Range   Glucose-Capillary 62 (L) 70 - 99 mg/dL    Comment: Glucose reference range applies only to samples taken after fasting for at least 8 hours.  Glucose, capillary     Status: None   Collection Time: 02/08/20  1:55 PM  Result Value Ref Range   Glucose-Capillary 75 70 - 99 mg/dL    Comment: Glucose reference range applies only to samples taken after fasting for at least 8 hours.   Comment 1 Notify RN     Current Facility-Administered Medications  Medication Dose Route Frequency Provider Last Rate Last Admin  . benzonatate (TESSALON) capsule 100 mg  100 mg Oral TID PRN Dhungel, Nishant, MD   100 mg at  02/08/20 1612  . enoxaparin (LOVENOX) injection 40 mg  40 mg Subcutaneous Q24H Andris Baumann, MD   40 mg at 02/08/20 0938  . famotidine (PEPCID) tablet 20 mg  20 mg Oral BID Dhungel, Nishant, MD   20 mg at 02/09/20 0853  . fluticasone (FLONASE) 50 MCG/ACT nasal spray 2 spray  2 spray Each Nare Daily Andris Baumann, MD   2 spray at 02/09/20 0854  . guaiFENesin-dextromethorphan (ROBITUSSIN DM) 100-10 MG/5ML syrup 5 mL  5 mL Oral Q4H PRN Dhungel, Nishant, MD   5  mL at 02/08/20 2133  . ondansetron (ZOFRAN) tablet 4 mg  4 mg Oral Q6H PRN Athena Masse, MD       Or  . ondansetron [facility]) injection 4 mg  4 mg Intravenous Q6H PRN Athena Masse, MD   4 mg at 02/08/20 0211  . sertraline (ZOLOFT) tablet 50 mg  50 mg Oral Daily Dhungel, Nishant, MD   50 mg at 02/09/20 0853  . sucralfate (CARAFATE) 1 GM/10ML suspension 1 g  1 g Oral TID WC & HS Dhungel, Nishant, MD   1 g at 02/09/20 1200    Musculoskeletal: Strength & Muscle Tone: normal  Gait & Station: no new change  Patient leans: n/a   Psychiatric Specialty Exam: Physical Exam  Per IM   Review of Systems 8 systems looked at   Blood pressure (!) 119/98, pulse 71, temperature 98.6 F (37 C), temperature source Oral, resp. rate 12, height 5\' 10"  (1.778 m), weight 61.2 kg, SpO2 (!) 88 %, currently breastfeeding.Body mass index is 19.37 kg/m.  General Appearance: normal  Rapport fair but okay  Eye Contact:  Normal   Speech:  Normal   Volume:  Normal   Mood:  Normal   Affect:  No new change   Thought Process:  Anxiety themes   Orientation:  Times four okay  Thought Content:  No psychosis or mania  Suicidal Thoughts:  None active   Homicidal Thoughts:  None active   Memory:  Remote recent and immediate intact   Judgement:  Fair   Insight:  Fair   Psychomotor Activity:  Normal   Concentration:  Normal   Recall:  Normal   Fund of Knowledge:  No other new change    Language:  Good   Akathisia:  None   Handed:  Right   AIMS (if  indicated):     Assets:  Family oriented school oriented  ADL's:  Normal   Cognition:  Normal range   Sleep:   three cycles okay     Treatment Plan Summary:   At this time patient is not imminently suicidal or homicidal --she does not meet IVC criteria ---she contracts for safety  Her gestures and issues were more attention seeking and in frustration.  She wants to be home with kids and go back to school  Her mom is also impatent for her to come home and does not feel risk issue at this time.   Patient recognizes the anxiety issues and wants to do relaxation and cognitive therapy ----medications possible as an outpatient  She poked scraped herself with eating utensil out of frustration to go home and feeling bored she said.  She also tried to run away because she was tired of waiting in the hospital and wanted to go home after the medical workup was over.       Disposition:  Home to mom with outpatient therapy follow up   Eulas Post, MD 02/09/2020 2:26 PM

## 2020-02-09 NOTE — Progress Notes (Signed)
RN notified by NT at 0250 that pt had convulsive episode when trying to get up to go to bathroom. Pt resituated in the bed, VSS but unable to arouse pt. After 2 mintues, pt woke up and expressed frustration with the situation. At this time, pt requested to have a one on one discussion with this nurse, in which she discussed her suicidal ideations. At the end of the discussion around 0350, she said there was "one more thing" at which point, she reached into her pocket and pulled out a knife that came off of her meal tray. She reported she had used it to attempt to cut herself, and showed this RN a superficial cut on her left inner wrist, where skin had been broken. She also reported she attempted to cut her stomach, no cut marks were seen by this RN. Pt consented to having her bed and personal belongings bag searched, and emptied her pockets for this RN to see. No further weapons or sharps were seen or obtained by this RN. NT came to sit with pt, advised to keep close eye on pt and notify nurse of any suspicious activity. This RN notified house supervisor, SWOT nurse and hospitalist of incident, at which time suicide precautions order was received, precatuions were implented and room was secured. This RN cleansed the left wrist cut and had further talks with the patient on how to manage her anxiety, aside from cutting her wrists. Pt agreed to plan of care at this time, will CTM closely.

## 2020-02-09 NOTE — Progress Notes (Signed)
[facility]  - [place] [facility]   PATIENT NAME: [name]    MR#:  [id number]  DATE OF BIRTH:  04-18-1996  SUBJECTIVE:  Tells me she wants to go home, she tried to cut her wrist due to feeling frustrated and overwhelmed about everything. "She reports to me that if she had not done this or not told about cutting her wrist she would have easily gone home"  REVIEW OF SYSTEMS:   Review of Systems  Constitutional: Negative for chills, fever and weight loss.  HENT: Negative for ear discharge, ear pain and nosebleeds.   Eyes: Negative for blurred vision, pain and discharge.  Respiratory: Negative for sputum production, shortness of breath, wheezing and stridor.   Cardiovascular: Negative for chest pain, palpitations, orthopnea and PND.  Gastrointestinal: Negative for abdominal pain, diarrhea, nausea and vomiting.  Genitourinary: Negative for frequency and urgency.  Musculoskeletal: Negative for back pain and joint pain.  Neurological: Negative for sensory change, speech change, focal weakness and weakness.  Psychiatric/Behavioral: Positive for depression. Negative for hallucinations. The patient is nervous/anxious.    Tolerating Diet:yes Tolerating PT: ambulatory  DRUG ALLERGIES:   Allergies  Allergen Reactions  . Peanuts [Peanut Oil] Anaphylaxis, Hives and Swelling    Tree nuts also   . Mushroom Extract Complex     VITALS:  Blood pressure 113/68, pulse (!) 53, temperature 98.5 F (36.9 C), temperature source Oral, resp. rate 17, height 5\' 10"  (1.778 m), weight 61.2 kg, SpO2 100 %, currently breastfeeding.  PHYSICAL EXAMINATION:   Physical Exam  GENERAL:  24 y.o.-year-old patient lying in the bed with no acute distress.  EYES: Pupils equal, round, reactive to light and accommodation. No scleral icterus.   HEENT: Head atraumatic, normocephalic. Oropharynx and nasopharynx clear.  NECK:  Supple, no jugular venous distention. No thyroid enlargement,  no tenderness.  LUNGS: Normal breath sounds bilaterally, no wheezing, rales, rhonchi. No use of accessory muscles of respiration.  CARDIOVASCULAR: S1, S2 normal. No murmurs, rubs, or gallops.  ABDOMEN: Soft, nontender, nondistended. Bowel sounds present. No organomegaly or mass.  EXTREMITIES: No cyanosis, clubbing or edema b/l.    NEUROLOGIC: Cranial nerves II through XII are intact. No focal Motor or sensory deficits b/l.   PSYCHIATRIC:  patient is alert and oriented x 3. Mood and affect stable SKIN: No obvious rash, lesion, or ulcer.   LABORATORY PANEL:  CBC Recent Labs  Lab 02/06/20 1946  WBC 5.8  HGB 12.6  HCT 36.3  PLT 174    Chemistries  Recent Labs  Lab 02/06/20 1946  NA 140  K 3.5  CL 107  CO2 27  GLUCOSE 94  BUN <5*  CREATININE 0.70  CALCIUM 9.1  AST 17  ALT 15  ALKPHOS 70  BILITOT 0.6   Cardiac Enzymes No results for input(s): TROPONINI in the last 168 hours. RADIOLOGY:  EEG  Result Date: 02/07/2020 Charlsie QuestYadav, Priyanka O, MD     02/07/2020  5:47 PM Patient Name: [name] MRN: [id number] Epilepsy Attending: Charlsie QuestPriyanka O Yadav Referring Physician/Provider: Dr. Lindajo RoyalHazel Duncan Date: 02/07/2019 Duration: 22.09 mins Patient history: 24 year old female noted to have shaking episode concerning for seizures.  EEG evaluate for seizures. Level of alertness: Awake, asleep AEDs during EEG study: None Technical aspects: This EEG study was done with scalp electrodes positioned according to the 10-20 International system of electrode placement. Electrical activity was acquired at a sampling rate of 500Hz  and reviewed with a high frequency filter of 70Hz  and a low frequency  filter of 1Hz . EEG data were recorded continuously and digitally stored. Description: The posterior dominant rhythm consists of 11 Hz activity of moderate voltage (25-35 uV) seen predominantly in posterior head regions, symmetric and reactive to eye opening and eye closing. .Sleep was characterized by vertex waves,  sleep spindles (12 to 14 Hz), maximal frontocentral region. Physiology photic driving was not seen during photic stimulation.  Hyperventilation was not performed.   One event was recorded during this study where patient was noted to have head bobbing, not responding to questions after photic stimulation at 15Hz , Concomitant eeg before, during and after the event didn't show any eeg change to suggest seizure. IMPRESSION: This study is within normal limits. No seizures or epileptiform discharges were seen throughout the recording. One event of head bobbing and unresponsiveness was recorded during this study without concomitant eeg change was NOT epileptic.   ECHOCARDIOGRAM COMPLETE  Result Date: 02/08/2020    ECHOCARDIOGRAM REPORT   Patient Name:   [name] Date of Exam: 02/08/2020 Medical Rec #:  Gabriella Ryder        Height:       70.0 in Accession #:    02/10/2020       Weight:       135.0 lb Date of Birth:  05-21-1996       BSA:          1.766 m Patient Age:    23 years         BP:           104/62 mmHg Patient Gender: F                HR:           62 bpm. Exam Location:  [facility] Procedure: 2D Echo, Color Doppler and Cardiac Doppler Indications:     R55 Syncope  History:         Patient has no prior history of Echocardiogram examinations.  Sonographer:     07/26/1995 RDCS (AE) Referring Phys:  [phone] [facility] Diagnosing Phys: [id number] MD IMPRESSIONS  1. Left ventricular ejection fraction, by estimation, is 60 to 65%. Left ventricular ejection fraction by PLAX is 61 %. The left ventricle has normal function. The left ventricle has no regional wall motion abnormalities. Left ventricular diastolic parameters were normal.  2. Right ventricular systolic function is normal. The right ventricular size is mildly enlarged. There is normal pulmonary artery systolic pressure.  3. The mitral valve is grossly normal. Trivial mitral valve regurgitation.  4. The aortic valve is tricuspid. Aortic  valve regurgitation is not visualized. FINDINGS  Left Ventricle: Left ventricular ejection fraction, by estimation, is 60 to 65%. Left ventricular ejection fraction by PLAX is 61 %. The left ventricle has normal function. The left ventricle has no regional wall motion abnormalities. The left ventricular internal cavity size was normal in size. There is borderline left ventricular hypertrophy. Left ventricular diastolic parameters were normal. Right Ventricle: The right ventricular size is mildly enlarged. No increase in right ventricular wall thickness. Right ventricular systolic function is normal. There is normal pulmonary artery systolic pressure. The tricuspid regurgitant velocity is 2.05  m/s, and with an assumed right atrial pressure of 10 mmHg, the estimated right ventricular systolic pressure is 26.8 mmHg. Left Atrium: Left atrial size was normal in size. Right Atrium: Right atrial size was normal in size. Pericardium: There is no evidence of pericardial effusion. Mitral Valve: The mitral valve is grossly normal.  Trivial mitral valve regurgitation. MV peak gradient, 5.0 mmHg. The mean mitral valve gradient is 2.0 mmHg. Tricuspid Valve: The tricuspid valve is grossly normal. Tricuspid valve regurgitation is mild. Aortic Valve: The aortic valve is tricuspid. Aortic valve regurgitation is not visualized. Aortic valve mean gradient measures 5.0 mmHg. Aortic valve peak gradient measures 9.7 mmHg. Aortic valve area, by VTI measures 1.69 cm. Pulmonic Valve: The pulmonic valve was not well visualized. Pulmonic valve regurgitation is trivial. Aorta: The aortic root is normal in size and structure. IAS/Shunts: The interatrial septum was not assessed.  LEFT VENTRICLE PLAX 2D LV EF:         Left            Diastology                ventricular     LV e' lateral:   17.80 cm/s                ejection        LV E/e' lateral: 5.0                fraction by     LV e' medial:    12.70 cm/s                PLAX is 61      LV  E/e' medial:  7.0                %. LVIDd:         4.72 cm LVIDs:         3.19 cm LV PW:         0.92 cm LV IVS:        0.58 cm LVOT diam:     1.70 cm LV SV:         50 LV SV Index:   28 LVOT Area:     2.27 cm  RIGHT VENTRICLE RV Basal diam:  2.49 cm LEFT ATRIUM             Index       RIGHT ATRIUM           Index LA diam:        2.10 cm 1.19 cm/m  RA Area:     10.30 cm LA Vol (A2C):   31.6 ml 17.89 ml/m RA Volume:   21.50 ml  12.17 ml/m LA Vol (A4C):   26.2 ml 14.83 ml/m LA Biplane Vol: 31.0 ml 17.55 ml/m  AORTIC VALVE                    PULMONIC VALVE AV Area (Vmax):    1.60 cm     PV Vmax:       0.90 m/s AV Area (Vmean):   1.55 cm     PV Vmean:      64.000 cm/s AV Area (VTI):     1.69 cm     PV VTI:        0.195 m AV Vmax:           156.00 cm/s  PV Peak grad:  3.3 mmHg AV Vmean:          105.000 cm/s PV Mean grad:  2.0 mmHg AV VTI:            0.294 m AV Peak Grad:      9.7 mmHg AV Mean Grad:      5.0 mmHg LVOT Vmax:  110.00 cm/s LVOT Vmean:        71.600 cm/s LVOT VTI:          0.219 m LVOT/AV VTI ratio: 0.74  AORTA Ao Root diam: 2.80 cm MITRAL VALVE               TRICUSPID VALVE MV Area (PHT): 3.03 cm    TR Peak grad:   16.8 mmHg MV Peak grad:  5.0 mmHg    TR Vmax:        205.00 cm/s MV Mean grad:  2.0 mmHg MV Vmax:       1.12 m/s    SHUNTS MV Vmean:      68.6 cm/s   Systemic VTI:  0.22 m MV Decel Time: 250 msec    Systemic Diam: 1.70 cm MV E velocity: 89.20 cm/s MV A velocity: 55.80 cm/s MV E/A ratio:  1.60 Bartholome Bill MD Electronically signed by Bartholome Bill MD Signature Date/Time: 02/08/2020/12:20:00 PM    Final    ASSESSMENT AND PLAN:  24 year old female with depression presented with recent history of sinus congestion sore throat with episode of loss of consciousness.  While in the ED she had a similar episode with suspected seizure-like activity.  Blood work done in the ED showed elevated lactic acid of 3.4.  Patient also complained of palpitations for which CT angiogram of the chest  was done which was negative for PE  ?seizure-like activity ([place]) -Seizure ruled out with negative EEG.   -  Discussed with neurology and this does not appear to be seizures and concern for conversion disorder/severe anxiety. -Patient reported having a lot of stress and has a psychiatry evaluation 2 months later for her anxiety/depression.  -Patient had an episode where she cut her left wrist with silverware yesterday. No active bleeding noted. She has been dealing with major depression and recently had a suicidal episode in April 2021 by trying to hang herself in the shower. She was admitted advised at [facility]  inpatient psychiatry unit -patient has been noncompliant with her antidepressant meds. She is unable to keep her appointment with psychiatry and for counseling due to her insurance issues according to mother -psychiatry consultation placed for Dr. Janese Banks. Secure chat message sent for patient to be seen today. -Continue seizure precaution and sitter -patient will benefit from inpatient psych transfer and further management for her major depression and anxiety-- await psych input -Medically she is stable at present   -No Ativan to be administered for any seizure-like activity.  Sinus tachycardia Orthostasis negative.  TSH normal.  2D echo with normal EF, shows trivial MR but no other findings. -Could be due to anxiety  Elevated lactic acid Possibly due to dehydration.  Resolved.  Depression, major with anxiety -patient has not been taking her antidepressants regularly. -Per mother she is having issues follow-up with psychiatry and counseling due to insurance issues. -Patient is at high risk for harming herself given recent episodes.  Mitral regurgitation Seen on 2D echo.  No correlation with her current symptoms.  Follow as outpatient.   DVT prophylaxis: Subcu Lovenox Code Status: Full code Family Communication: Mother saw me on the phone this morning Consults : Dr. Janese Banks  psychiatry   Status is: Inpatient  Remains inpatient appropriate because:Unsafe d/c plan   Dispo: The patient is from: Home              Anticipated d/c is to: TBD  Anticipated d/c date is: TBD              Patient currently is medically stable however needs Psychiatry evaluation for further management of SI/slef harm and major depression.   TOTAL TIME TAKING CARE OF THIS PATIENT: **35* minutes.  >50% time spent on counselling and coordination of care  Note: This dictation was prepared with Dragon dictation along with smaller phrase technology. Any transcriptional errors that result from this process are unintentional.  Enedina Finner M.D    [facility]   CC: Primary care physician; Trey Sailors, PA-CPatient ID: Gabriella Ryder, female   DOB: Oct 03, 1995, 24 y.o.   MRN: [id number]

## 2020-02-09 NOTE — Progress Notes (Signed)
   02/09/20 0150  Clinical Encounter Type  Visited With Patient  Visit Type Follow-up;Spiritual support;Social support  Referral From Chaplain  Consult/Referral To Chaplain  Ch follow-up with Pt. Pt said that she was doing a lot better. Pt also said she was getting ready to go home. Ch will follow-up with Pt.

## 2020-02-09 NOTE — Progress Notes (Signed)
Pt is getting increasingly agitated and anxious stating she "wants to go home to my kids". Pt attempted to pulled out IV, [place] agreed to disconnect continuous fluids if pt agreed not to pull out IV. Pt agreed. Pt continued to get agitated and stood up grabbing her purse/bag and attempting to leave the room. Author was able to talk pt down and convinced her to wait for Psych Consult. Author did not feel threatened in any way but pt was verbalizing that she will leave if she wants to.  Author informed pt of protocol and if she attempted to leave we would be forced to call Security. Pt calm and cooperative at this time. RN will continue to monitor for deviations from baseline

## 2020-02-09 NOTE — Progress Notes (Addendum)
Patient ID: [name], female   DOB: [birth year], 24 y.o.   MRN: [id number] Pt was seen by psychiatry Dr Smith Robert and he is OK with pt to go home. Pt has been impatient since yday to go home. Per Dr Smith Robert pt can f/u with her psychiatrist as outpt. He has spoken with her mother and at present not at risk for Suicide and ok to go home from his standpoint. Mother is fine with it. Dr Smith Robert does not recommend any antidepressants. Pt will f/u psych and neurology as out pt  Discharge home

## 2020-02-09 NOTE — Discharge Summary (Signed)
[facility] [place] NAME: [name]    MR#:  [id number]  [place] OF BIRTH:  04/03/1996  DATE OF ADMISSION:  02/06/2020 ADMITTING PHYSICIAN: Athena Masse, MD  DATE OF DISCHARGE: 6/16/29021  PRIMARY CARE PHYSICIAN: Trinna Post, PA-C    ADMISSION DIAGNOSIS:  Cough [R05] Recurrent seizures ([place]) [G40.909] Seizure-like activity ([place]) [R56.9] Syncope, unspecified syncope type [R55]  DISCHARGE DIAGNOSIS:  Shaking/syncopal like Non epiletiform episodes suspected stress/anxiety related Major Depression  SECONDARY DIAGNOSIS:   Past Medical History:  Diagnosis Date  . Anxiety   . Depression   . Gastritis   . Heart murmur   . Sickle cell trait [facility])     HOSPITAL COURSE:  24 year old female with depression presented with recent history of sinus congestion sore throat with episode of loss of consciousness. While in the ED she had a similar episode with suspected seizure-like activity. Blood work done in the ED showed elevated lactic acid of 3.4. Patient also complained of palpitations for which CT angiogram of the chest was done which was negative for PE  ?seizure-like activity ([place]) -Seizure ruled out with negative EEG.  - Discussed with neurology and this does not appear to be seizures and concern for conversion disorder/severe anxiety related -Patient reported having a lot of stress and has a psychiatry evaluation 2 months later for her anxiety/depression.  -Patient had an episode where she cut her left wrist with silverware yesterday. No active bleeding noted. She has been dealing with major depression and recently had a suicidal episode in April 2021 by trying to hang herself in the shower. She was admitted advised at [facility]  inpatient psychiatry unit -patient has been noncompliant with her antidepressant meds. She is unable to keep her appointment with psychiatry and for counseling due to her insurance issues according to  mother -psychiatry consultation placed for Dr. Rao--OK to discharge to home from his standpoint  Sinus tachycardia Orthostasis negative. TSH normal. 2D echo with normal EF, shows trivial MR but no other findings. -Could be due to anxiety -HR in the 70's   Depression, major with anxiety -patient has not been taking her antidepressants regularly. -Per mother she is having issues follow-up with psychiatry and counseling due to insurance issues. -Patient is at high risk for harming herself given recent episodes. -Dr Janese Banks saw pt--OK to go home and f/u with outpt psych    DVT prophylaxis:Subcu Lovenox Code Status:Full code Family Communication:Mother  on the phone this morning Consults : Dr. Janese Banks psychiatry   Status is: Inpatient   Dispo: The patient is from: Home  Anticipated d/c is to: home  Anticipated d/c date FK:CLEXN--TZGYFVC by psych now to go home   CONSULTS OBTAINED:  Treatment Team:  Leotis Pain, MD Eulas Post, MD  DRUG ALLERGIES:   Allergies  Allergen Reactions  . Peanuts [Peanut Oil] Anaphylaxis, Hives and Swelling    Tree nuts also   . Mushroom Extract Complex     DISCHARGE MEDICATIONS:   Allergies as of 02/09/2020      Reactions   Peanuts [peanut Oil] Anaphylaxis, Hives, Swelling   Tree nuts also    Mushroom Extract Complex       Medication List    STOP taking these medications   hydrOXYzine 25 MG tablet Commonly known as: ATARAX/VISTARIL   ibuprofen 600 MG tablet Commonly known as: ADVIL   sertraline 50 MG tablet Commonly known as: ZOLOFT   traZODone 50 MG tablet Commonly known as: DESYREL  TAKE these medications   famotidine 20 MG tablet Commonly known as: Pepcid Take 1 tablet (20 mg total) by mouth 2 (two) times daily.   lactulose 10 GM/15ML solution Commonly known as: CHRONULAC Take 30 mLs (20 g total) by mouth daily as needed for mild constipation.   levonorgestrel 20  MCG/24HR IUD Commonly known as: MIRENA 1 each by Intrauterine route once.   One Daily Multivitamin Adult Tabs Take 1 tablet by mouth daily.   sucralfate 1 GM/10ML suspension Commonly known as: Carafate Take 10 mLs (1 g total) by mouth 4 (four) times daily.       If you experience worsening of your admission symptoms, develop shortness of breath, life threatening emergency, suicidal or homicidal thoughts you must seek medical attention immediately by calling 911 or calling your MD immediately  if symptoms less severe.  You Must read complete instructions/literature along with all the possible adverse reactions/side effects for all the Medicines you take and that have been prescribed to you. Take any new Medicines after you have completely understood and accept all the possible adverse reactions/side effects.   Please note  You were cared for by a hospitalist during your hospital stay. If you have any questions about your discharge medications or the care you received while you were in the hospital after you are discharged, you can call the unit and asked to speak with the hospitalist on call if the hospitalist that took care of you is not available. Once you are discharged, your primary care physician will handle any further medical issues. Please note that NO REFILLS for any discharge medications will be authorized once you are discharged, as it is imperative that you return to your primary care physician (or establish a relationship with a primary care physician if you do not have one) for your aftercare needs so that they can reassess your need for medications and monitor your lab values.   DATA REVIEW:   CBC  Recent Labs  Lab 02/06/20 1946  WBC 5.8  HGB 12.6  HCT 36.3  PLT 174    Chemistries  Recent Labs  Lab 02/06/20 1946  NA 140  K 3.5  CL 107  CO2 27  GLUCOSE 94  BUN <5*  CREATININE 0.70  CALCIUM 9.1  AST 17  ALT 15  ALKPHOS 70  BILITOT 0.6    Microbiology  Results   Recent Results (from the past 240 hour(s))  SARS Coronavirus 2 by RT PCR (hospital order, performed in [facility] hospital lab) Nasopharyngeal Nasopharyngeal Swab     Status: None   Collection Time: 02/06/20  8:27 PM   Specimen: Nasopharyngeal Swab  Result Value Ref Range Status   SARS Coronavirus 2 NEGATIVE NEGATIVE Final    Comment: (NOTE) SARS-CoV-2 target nucleic acids are NOT DETECTED.  The SARS-CoV-2 RNA is generally detectable in upper and lower respiratory specimens during the acute phase of infection. The lowest concentration of SARS-CoV-2 viral copies this assay can detect is 250 copies / mL. A negative result does not preclude SARS-CoV-2 infection and should not be used as the sole basis for treatment or other patient management decisions.  A negative result may occur with improper specimen collection / handling, submission of specimen other than nasopharyngeal swab, presence of viral mutation(s) within the areas targeted by this assay, and inadequate number of viral copies (<250 copies / mL). A negative result must be combined with clinical observations, patient history, and epidemiological information.  Fact Sheet for Patients:  BoilerBrush.com.cyhttps://www.fda.gov/media/136312/download  Fact Sheet for Healthcare Providers: https://pope.com/https://www.fda.gov/media/136313/download  This test is not yet approved or  cleared by the [place] FDA and has been authorized for detection and/or diagnosis of SARS-CoV-2 by FDA under an Emergency Use Authorization (EUA).  This EUA will remain in effect (meaning this test can be used) for the duration of the COVID-19 declaration under Section 564(b)(1) of the Act, 21 U.S.C. section 360bbb-3(b)(1), unless the authorization is terminated or revoked sooner.  Performed at [facility], [address] [place], [place] [address]     RADIOLOGY:  EEG  Result Date: 02/07/2020 Charlsie QuestYadav, Priyanka O, MD     02/07/2020  5:47 PM Patient Name:  [name] MRN: [id number] Epilepsy Attending: Charlsie QuestPriyanka O Yadav Referring Physician/Provider: Dr. Lindajo RoyalHazel Duncan Date: 02/07/2019 Duration: 22.09 mins Patient history: 24 year old female noted to have shaking episode concerning for seizures.  EEG evaluate for seizures. Level of alertness: Awake, asleep AEDs during EEG study: None Technical aspects: This EEG study was done with scalp electrodes positioned according to the 10-20 International system of electrode placement. Electrical activity was acquired at a sampling rate of 500Hz  and reviewed with a high frequency filter of 70Hz  and a low frequency filter of 1Hz . EEG data were recorded continuously and digitally stored. Description: The posterior dominant rhythm consists of 11 Hz activity of moderate voltage (25-35 uV) seen predominantly in posterior head regions, symmetric and reactive to eye opening and eye closing. .Sleep was characterized by vertex waves, sleep spindles (12 to 14 Hz), maximal frontocentral region. Physiology photic driving was not seen during photic stimulation.  Hyperventilation was not performed.   One event was recorded during this study where patient was noted to have head bobbing, not responding to questions after photic stimulation at 15Hz , Concomitant eeg before, during and after the event didn't show any eeg change to suggest seizure. IMPRESSION: This study is within normal limits. No seizures or epileptiform discharges were seen throughout the recording. One event of head bobbing and unresponsiveness was recorded during this study without concomitant eeg change was NOT epileptic. Charlsie Questriyanka O Yadav   ECHOCARDIOGRAM COMPLETE  Result Date: 02/08/2020    ECHOCARDIOGRAM REPORT   Patient Name:   Gabriella RyderSHAWIANNA Perkins Date of Exam: 02/08/2020 Medical Rec #:  [id number]        Height:       70.0 in Accession #:    [id number]       Weight:       135.0 lb Date of Birth:  [birth year]       BSA:          1.766 m Patient Age:    23 years         BP:            104/62 mmHg Patient Gender: F                HR:           62 bpm. Exam Location:  [facility] Procedure: 2D Echo, Color Doppler and Cardiac Doppler Indications:     R55 Syncope  History:         Patient has no prior history of Echocardiogram examinations.  Sonographer:     Humphrey RollsJoan Heiss RDCS (AE) Referring Phys:  [phone] [facility] DH[facility] Diagnosing Phys: Harold HedgeKenneth Fath MD IMPRESSIONS  1. Left ventricular ejection fraction, by estimation, is 60 to 65%. Left ventricular ejection fraction by PLAX is 61 %. The left ventricle has normal function. The left ventricle has no regional  wall motion abnormalities. Left ventricular diastolic parameters were normal.  2. Right ventricular systolic function is normal. The right ventricular size is mildly enlarged. There is normal pulmonary artery systolic pressure.  3. The mitral valve is grossly normal. Trivial mitral valve regurgitation.  4. The aortic valve is tricuspid. Aortic valve regurgitation is not visualized. FINDINGS  Left Ventricle: Left ventricular ejection fraction, by estimation, is 60 to 65%. Left ventricular ejection fraction by PLAX is 61 %. The left ventricle has normal function. The left ventricle has no regional wall motion abnormalities. The left ventricular internal cavity size was normal in size. There is borderline left ventricular hypertrophy. Left ventricular diastolic parameters were normal. Right Ventricle: The right ventricular size is mildly enlarged. No increase in right ventricular wall thickness. Right ventricular systolic function is normal. There is normal pulmonary artery systolic pressure. The tricuspid regurgitant velocity is 2.05  m/s, and with an assumed right atrial pressure of 10 mmHg, the estimated right ventricular systolic pressure is 26.8 mmHg. Left Atrium: Left atrial size was normal in size. Right Atrium: Right atrial size was normal in size. Pericardium: There is no evidence of pericardial effusion. Mitral Valve: The mitral valve is  grossly normal. Trivial mitral valve regurgitation. MV peak gradient, 5.0 mmHg. The mean mitral valve gradient is 2.0 mmHg. Tricuspid Valve: The tricuspid valve is grossly normal. Tricuspid valve regurgitation is mild. Aortic Valve: The aortic valve is tricuspid. Aortic valve regurgitation is not visualized. Aortic valve mean gradient measures 5.0 mmHg. Aortic valve peak gradient measures 9.7 mmHg. Aortic valve area, by VTI measures 1.69 cm. Pulmonic Valve: The pulmonic valve was not well visualized. Pulmonic valve regurgitation is trivial. Aorta: The aortic root is normal in size and structure. IAS/Shunts: The interatrial septum was not assessed.  LEFT VENTRICLE PLAX 2D LV EF:         Left            Diastology                ventricular     LV e' lateral:   17.80 cm/s                ejection        LV E/e' lateral: 5.0                fraction by     LV e' medial:    12.70 cm/s                PLAX is 61      LV E/e' medial:  7.0                %. LVIDd:         4.72 cm LVIDs:         3.19 cm LV PW:         0.92 cm LV IVS:        0.58 cm LVOT diam:     1.70 cm LV SV:         50 LV SV Index:   28 LVOT Area:     2.27 cm  RIGHT VENTRICLE RV Basal diam:  2.49 cm LEFT ATRIUM             Index       RIGHT ATRIUM           Index LA diam:        2.10 cm 1.19 cm/m  RA Area:     10.30  cm LA Vol (A2C):   31.6 ml 17.89 ml/m RA Volume:   21.50 ml  12.17 ml/m LA Vol (A4C):   26.2 ml 14.83 ml/m LA Biplane Vol: 31.0 ml 17.55 ml/m  AORTIC VALVE                    PULMONIC VALVE AV Area (Vmax):    1.60 cm     PV Vmax:       0.90 m/s AV Area (Vmean):   1.55 cm     PV Vmean:      64.000 cm/s AV Area (VTI):     1.69 cm     PV VTI:        0.195 m AV Vmax:           156.00 cm/s  PV Peak grad:  3.3 mmHg AV Vmean:          105.000 cm/s PV Mean grad:  2.0 mmHg AV VTI:            0.294 m AV Peak Grad:      9.7 mmHg AV Mean Grad:      5.0 mmHg LVOT Vmax:         110.00 cm/s LVOT Vmean:        71.600 cm/s LVOT VTI:          0.219 m  LVOT/AV VTI ratio: 0.74  AORTA Ao Root diam: 2.80 cm MITRAL VALVE               TRICUSPID VALVE MV Area (PHT): 3.03 cm    TR Peak grad:   16.8 mmHg MV Peak grad:  5.0 mmHg    TR Vmax:        205.00 cm/s MV Mean grad:  2.0 mmHg MV Vmax:       1.12 m/s    SHUNTS MV Vmean:      68.6 cm/s   Systemic VTI:  0.22 m MV Decel Time: 250 msec    Systemic Diam: 1.70 cm MV E velocity: 89.20 cm/s MV A velocity: 55.80 cm/s MV E/A ratio:  1.60 Harold Hedge MD Electronically signed by Harold Hedge MD Signature Date/Time: 02/08/2020/12:20:00 PM    Final      CODE STATUS:     Code Status Orders  (From admission, onward)         Start     Ordered   02/07/20 0045  Full code  Continuous        02/07/20 0047        Code Status History    Date Active Date Inactive Code Status Order ID Comments User Context   11/30/2019 1057 12/03/2019 1620 Full Code [id number]  Jackelyn Poling, NP Inpatient   11/29/2019 2154 11/30/2019 0938 Full Code [id number]  Terrilee Files, MD ED   11/06/2018 0820 11/07/2018 1630 Full Code [id number]  Natale Milch, MD Inpatient   11/06/2018 0202 11/06/2018 0820 Full Code [id number]  Oswaldo Conroy, CNM Inpatient   11/02/2018 1020 11/02/2018 1503 Full Code [id number]  Oswaldo Conroy, CNM Inpatient   10/31/2018 1705 10/31/2018 2027 Full Code [id number]  Conard Novak, MD Inpatient   02/03/2017 0235 02/04/2017 1809 Full Code [id number]  Conard Novak, MD Inpatient   02/02/2017 0554 02/03/2017 0235 Full Code [id number]  Farrel Conners, CNM Inpatient   01/26/2017 0231 01/26/2017 1130 Full Code [id number]  Vena Austria, MD Inpatient   01/25/2017 2040 01/26/2017 0045 Full Code [id number]  Vena Austria,  MD Inpatient   01/23/2017 2311 01/24/2017 0227 Full Code [id number]  Artist Pais, RN Inpatient   01/18/2017 1909 01/18/2017 2229 Full Code [id number]  Tresea Mall, [facility] Inpatient   09/14/2016 0307 09/14/2016 0752 Full Code [id number]  Conard Novak, MD Inpatient   Advance Care Planning  Activity       TOTAL TIME TAKING CARE OF THIS PATIENT: *40* minutes.    Enedina Finner M.D  [facility]    CC: Primary care physician; Trey Sailors, PA-C

## 2020-02-10 NOTE — Progress Notes (Signed)
Inetta Fermo Cummings,acting as a Neurosurgeon for [employer], [employer].,have documented all relevant documentation on the behalf of [employer],as directed by  [employer] while in the presence of [employer], [place].  Established patient visit   Patient: [name]   DOB: [birth year]   24 y.o. Female  MRN: [id number] Visit Date: 02/11/2020  Today's healthcare provider: Dortha Kern, PA   Chief Complaint  Patient presents with  . Hospitalization Follow-up   Subjective    HPI   Follow up ER visit  Patient was seen in ER for . Treatment for this included see chart notes. She reports good compliance with treatment. She reports this condition is Improved.  -----------------------------------------------------------------------------------------  Patient would like to know if she could be prescribed something to help with her acute URI she was diagnosed with it at the hospital but was not prescribed anything for it and also would like to know if she could have a refill on her EPI-Pen she has not had one in a while.   Social History   Tobacco Use  . Smoking status: Never Smoker  . Smokeless tobacco: Never Used  Vaping Use  . Vaping Use: Former  . Devices: vaped every day until a week ago  Substance Use Topics  . Alcohol use: Not Currently    Comment: 11/29/2019: drank 1.5 bottles of wine and 1/2 bottle of vodka mixed withj juidce  . Drug use: No   Past Medical History:  Diagnosis Date  . Anxiety   . Depression   . Gastritis   . Heart murmur   . Sickle cell trait [facility])    Past Surgical History:  Procedure Laterality Date  . NO PAST SURGERIES     Family History  Problem Relation Age of Onset  . Hypertension Maternal Grandmother   . Healthy Mother   . Healthy Father    Allergies  Allergen Reactions  . Peanuts [Peanut Oil] Anaphylaxis, Hives and Swelling    Tree nuts also   . Mushroom Extract Complex    Medications: Outpatient Medications  Prior to Visit  Medication Sig  . famotidine (PEPCID) 20 MG tablet Take 1 tablet (20 mg total) by mouth 2 (two) times daily.  Marland Kitchen levonorgestrel (MIRENA) 20 MCG/24HR IUD 1 each by Intrauterine route once.  . sertraline (ZOLOFT) 50 MG tablet Take by mouth.  . sucralfate (CARAFATE) 1 GM/10ML suspension Take 10 mLs (1 g total) by mouth 4 (four) times daily.  Marland Kitchen lactulose (CHRONULAC) 10 GM/15ML solution Take 30 mLs (20 g total) by mouth daily as needed for mild constipation. (Patient not taking: Reported on 02/11/2020)  . Multiple Vitamins-Minerals (ONE DAILY MULTIVITAMIN ADULT) TABS Take 1 tablet by mouth daily. (Patient not taking: Reported on 02/11/2020)   No facility-administered medications prior to visit.    Review of Systems  Constitutional: Negative.   HENT: Positive for congestion and sinus pressure.   Respiratory: Positive for cough. Negative for shortness of breath and wheezing.        Yellow green sputum occasionally.  Cardiovascular: Negative.   Gastrointestinal: Negative.     Last CBC Lab Results  Component Value Date   WBC 5.8 02/06/2020   HGB 12.6 02/06/2020   HCT 36.3 02/06/2020   MCV 81.4 02/06/2020   MCH 28.3 02/06/2020   RDW 12.6 02/06/2020   PLT 174 02/06/2020   Last metabolic panel Lab Results  Component Value Date   GLUCOSE 94 02/06/2020   NA 140 02/06/2020   K  3.5 02/06/2020   CL 107 02/06/2020   CO2 27 02/06/2020   BUN <5 (L) 02/06/2020   CREATININE 0.70 02/06/2020   GFRNONAA >60 02/06/2020   GFRAA >60 02/06/2020   CALCIUM 9.1 02/06/2020   PROT 7.1 02/06/2020   ALBUMIN 3.9 02/06/2020   LABGLOB 2.8 11/12/2019   AGRATIO 1.8 11/12/2019   BILITOT 0.6 02/06/2020   ALKPHOS 70 02/06/2020   AST 17 02/06/2020   ALT 15 02/06/2020   ANIONGAP 6 02/06/2020      Objective    BP 112/76 (BP Location: Right Arm, Patient Position: Sitting, Cuff Size: Normal)   Pulse 73   Temp (!) 97.3 F (36.3 C) (Temporal)   Ht 5\' 10"  (1.778 m)   Wt 129 lb (58.5 kg)    BMI 18.51 kg/m   Physical Exam Constitutional:      General: She is not in acute distress.    Appearance: She is well-developed.  HENT:     Head: Normocephalic and atraumatic.     Right Ear: Hearing and tympanic membrane normal.     Left Ear: Hearing and tympanic membrane normal.     Nose: Nose normal.     Mouth/Throat:     Mouth: Mucous membranes are moist.     Pharynx: Oropharynx is clear.  Eyes:     General: Lids are normal. No scleral icterus.       Right eye: No discharge.        Left eye: No discharge.     Conjunctiva/sclera: Conjunctivae normal.  Cardiovascular:     Rate and Rhythm: Normal rate and regular rhythm.     Heart sounds: Normal heart sounds.  Pulmonary:     Effort: Pulmonary effort is normal. No respiratory distress.     Breath sounds: Normal breath sounds. No wheezing or rhonchi.  Abdominal:     General: Bowel sounds are normal.     Palpations: Abdomen is soft.  Musculoskeletal:        General: Normal range of motion.  Skin:    Findings: No lesion or rash.  Neurological:     Mental Status: She is alert and oriented to person, place, and time.  Psychiatric:        Speech: Speech normal.        Behavior: Behavior normal.        Thought Content: Thought content normal.      No results found for any visits on 02/11/20.  Assessment & Plan     1. Subacute maxillary sinusitis Developed maxillary pressure discomfort and hacking cough over the past week. Evaluated in the ER on 02-06-20 with normal labs except elevation of lactic acid level (3.4 on admission to the ER) and negative CXR with no acute abnormalities on CT angiography of chest with contrast. Will continue antihistamine for allergy symptoms, add Mucinex-DM with Amoxil for sinusitis versus early bronchitis. Increase fluid intake and recheck prn. - amoxicillin (AMOXIL) 875 MG tablet; Take 1 tablet (875 mg total) by mouth 2 (two) times daily.  Dispense: 20 tablet; Refill: 0  2. Vasovagal  syncope Secondary to a "bad cough". Fainted once at home and once in the ER.  3. Witnessed seizure-like activity (HCC) States she had a "seizure" in the ER 02-06-20 but EEG denied signs of epilepsy. No further seizure-like activity since that time. States neurologist consulted felt this was related to anxiety/depression and vasovagal response to coughing.  4. History of peanut allergy States she has had anaphylactic reactions to multiple allergens  that present with swelling of mouth and throat. Keeps Epi-Pen available for emergency use. - EPINEPHrine 0.3 mg/0.3 mL IJ SOAJ injection; Inject 0.3 mLs (0.3 mg total) into the muscle as needed for anaphylaxis.  Dispense: 2 each; Refill: 0   No follow-ups on file.      Andres Shad, PA, have reviewed all documentation for this visit. The documentation on 02/11/20 for the exam, diagnosis, procedures, and orders are all accurate and complete.    Vernie Murders, [place] [phone] (phone) [phone] (fax)  [place]

## 2020-02-11 ENCOUNTER — Other Ambulatory Visit: Payer: Self-pay

## 2020-02-11 ENCOUNTER — Ambulatory Visit (INDEPENDENT_AMBULATORY_CARE_PROVIDER_SITE_OTHER): Payer: PRIVATE HEALTH INSURANCE | Admitting: Family Medicine

## 2020-02-11 ENCOUNTER — Ambulatory Visit: Payer: 59 | Attending: Internal Medicine

## 2020-02-11 ENCOUNTER — Encounter: Payer: Self-pay | Admitting: Family Medicine

## 2020-02-11 VITALS — BP 112/76 | HR 73 | Temp 97.3°F | Ht 70.0 in | Wt 129.0 lb

## 2020-02-11 DIAGNOSIS — J0100 Acute maxillary sinusitis, unspecified: Secondary | ICD-10-CM | POA: Diagnosis not present

## 2020-02-11 DIAGNOSIS — Z91010 Allergy to peanuts: Secondary | ICD-10-CM | POA: Diagnosis not present

## 2020-02-11 DIAGNOSIS — R55 Syncope and collapse: Secondary | ICD-10-CM | POA: Diagnosis not present

## 2020-02-11 DIAGNOSIS — R569 Unspecified convulsions: Secondary | ICD-10-CM

## 2020-02-11 DIAGNOSIS — Z23 Encounter for immunization: Secondary | ICD-10-CM

## 2020-02-11 MED ORDER — AMOXICILLIN 875 MG PO TABS: 875.0000 mg | ORAL_TABLET | Freq: Two times a day (BID) | ORAL | 0 refills | Status: DC

## 2020-02-11 MED ORDER — EPINEPHRINE 0.3 MG/0.3ML IJ SOAJ: 0.3000 mg | INTRAMUSCULAR | 0 refills | Status: DC | PRN

## 2020-02-11 NOTE — Progress Notes (Signed)
° °  Covid-19 Vaccination Clinic  Name:  [name]    MRN: [id number] DOB: 09/17/1995  02/11/2020  [name] was observed post Covid-19 immunization for 30 minutes based on pre-vaccination screening .  During the observation period, she experienced an adverse reaction with the following symptoms:  dizziness and rapid heart rate.  Assessment : Time of assessment . 8:40am- Alert and oriented able to respond to questions but blank stare. Mom accompanying, at her side.She was discharged home on Wednesday from [facility] after a 4 day stay for seizure like activity after passing out at home on Saturday. CT at hospital showed chronic sinusitis. Has continued with coughing and nasal congestion since discharge.   Actions taken: 8:43am- moved out of pt area and laid on stretcher. B/P 127/83, HR-66. C/O feeling hot. No breakfast or fluids taken in this AM. OJ given and water. 8:45am- Talking and laughing with mom still verbalizes dizziness. 8:49am-B/P 124/72, HR- 67. 9am- Sat on side of stretcher B/P 115/79, HR- 68, O2 sat 100% on room air. Verbalizes she feel some dizziness. 9:05am- Stood up,B/P 123/89, HR- 68 became very dizzy and unsteady on feet, immediately laid back down. Encouraged to drink water. 9:14am- Sat up on stretcher, no dizziness. B/P 118/67, HR-67. 9:17am- Stood up, denies dizziness. B/P-119/67, HR- 68. 9:18am- ambulating without complaints. No dizziness, HA, SOB and alert and oriented x4. 9:20am- Discharged home and will be home with mom all day, she has a post hospital f/u appt with her PCP today. Will relay with information to them.    Medications administered: No medication administered.  Disposition: Reports no further symptoms of adverse reaction after observation for 40 minutes. Discharged home.   Immunizations Administered    Name Date Dose VIS Date Route   Pfizer COVID-19 Vaccine 02/11/2020  8:30 AM 0.3 mL 10/20/2018 Intramuscular   Manufacturer: [employer], [employer]   Lot: [id number]    NDC: [address]-5

## 2020-02-15 ENCOUNTER — Ambulatory Visit: Payer: 59 | Attending: Internal Medicine

## 2020-02-19 ENCOUNTER — Encounter: Payer: Self-pay | Admitting: Emergency Medicine

## 2020-02-19 ENCOUNTER — Emergency Department
Admission: EM | Admit: 2020-02-19 | Discharge: 2020-02-19 | Disposition: A | Discharge status: Home / Self Care | Payer: 59 | Attending: Emergency Medicine | Admitting: Emergency Medicine

## 2020-02-19 ENCOUNTER — Other Ambulatory Visit: Payer: Self-pay

## 2020-02-19 ENCOUNTER — Emergency Department: Payer: 59

## 2020-02-19 DIAGNOSIS — S92154A Nondisplaced avulsion fracture (chip fracture) of right talus, initial encounter for closed fracture: Secondary | ICD-10-CM | POA: Diagnosis not present

## 2020-02-19 DIAGNOSIS — M2550 Pain in unspecified joint: Secondary | ICD-10-CM | POA: Diagnosis not present

## 2020-02-19 DIAGNOSIS — S93431A Sprain of tibiofibular ligament of right ankle, initial encounter: Secondary | ICD-10-CM | POA: Insufficient documentation

## 2020-02-19 DIAGNOSIS — M2540 Effusion, unspecified joint: Secondary | ICD-10-CM | POA: Diagnosis not present

## 2020-02-19 DIAGNOSIS — S93491A Sprain of other ligament of right ankle, initial encounter: Secondary | ICD-10-CM

## 2020-02-19 DIAGNOSIS — Y9301 Activity, walking, marching and hiking: Secondary | ICD-10-CM | POA: Diagnosis not present

## 2020-02-19 DIAGNOSIS — R2689 Other abnormalities of gait and mobility: Secondary | ICD-10-CM | POA: Insufficient documentation

## 2020-02-19 DIAGNOSIS — Y929 Unspecified place or not applicable: Secondary | ICD-10-CM | POA: Diagnosis not present

## 2020-02-19 DIAGNOSIS — Y999 Unspecified external cause status: Secondary | ICD-10-CM | POA: Insufficient documentation

## 2020-02-19 DIAGNOSIS — S99911A Unspecified injury of right ankle, initial encounter: Secondary | ICD-10-CM | POA: Diagnosis present

## 2020-02-19 DIAGNOSIS — X58XXXA Exposure to other specified factors, initial encounter: Secondary | ICD-10-CM | POA: Diagnosis not present

## 2020-02-19 MED ORDER — TRAMADOL HCL 50 MG PO TABS: 50.0000 mg | ORAL_TABLET | Freq: Four times a day (QID) | ORAL | 0 refills | Status: DC | PRN

## 2020-02-19 MED ORDER — HYDROCODONE-ACETAMINOPHEN 5-325 MG PO TABS
1.0000 | ORAL_TABLET | ORAL | Status: AC
  Administered 2020-02-19: 1 via ORAL
  Filled 2020-02-19: qty 1

## 2020-02-19 NOTE — ED Triage Notes (Signed)
Pt arrives POV to triage with c/o chasing her child and twisting her right ankle. Pt denies head trauma or LOC. Pt is in NAD.

## 2020-02-19 NOTE — ED Notes (Signed)
No peripheral IV placed this visit.   Discharge instructions reviewed with patient. Questions fielded by this RN. Patient verbalizes understanding of instructions. Patient discharged home in stable condition per [place] [facility] . No acute distress noted at time of discharge.   Pt wheeled to front of ED

## 2020-02-19 NOTE — Discharge Instructions (Addendum)
Please rest ice and elevate the right ankle and foot.  Use lace up ankle brace x1 week.  Use crutches as needed until you are able to walk without a limp.  Take Tylenol as needed for mild pain and tramadol as needed for moderate to severe pain.  Follow-up with orthopedics if no improvement 1 week.

## 2020-02-19 NOTE — ED Provider Notes (Signed)
[facility] EMERGENCY DEPARTMENT Provider Note   CSN: [id number] Arrival date & time: 02/19/20  2136     History Chief Complaint  Patient presents with  . Ankle Pain    [name] is a 24 y.o. female presents to the emergency department evaluation of right ankle pain.  Patient was wearing high-heeled shoes, rolled her right ankle and has pain along the top of the right foot.  She is unable to bear weight.  She has not had a medications for pain.  Pain is moderate to severe.  She denies any her head or losing consciousness.  HPI     Past Medical History:  Diagnosis Date  . Anxiety   . Depression   . Gastritis   . Heart murmur   . Sickle cell trait [facility])     Patient Active Problem List   Diagnosis Date Noted  . Witnessed seizure-like activity ([place]) 02/07/2020  . Syncope 02/07/2020  . URTI (acute upper respiratory infection) 02/07/2020  . Lactic acidosis 02/07/2020  . Recurrent seizures ([place]) 02/07/2020  . Alcohol abuse 01/07/2020  . Suicide attempt by hanging ([place]) 01/07/2020  . H/O self-harm 01/07/2020  . Severe recurrent major depression without psychotic features ([place]) 11/30/2019  . Encounter for planned induction of labor 11/06/2018  . Anemia affecting pregnancy, antepartum 10/02/2018  . Circumvallate placenta, unspecified trimester 09/04/2018  . Depression 04/17/2018  . Supervision of other normal pregnancy, antepartum 03/25/2018    Past Surgical History:  Procedure Laterality Date  . NO PAST SURGERIES       OB History    Gravida  2   Para  2   Term  2   Preterm      AB      Living  2     SAB      TAB      Ectopic      Multiple  0   Live Births  2           Family History  Problem Relation Age of Onset  . Hypertension Maternal Grandmother   . Healthy Mother   . Healthy Father     Social History   Tobacco Use  . Smoking status: Never Smoker  . Smokeless tobacco: Never Used  Vaping Use  . Vaping  Use: Former  . Devices: vaped every day until a week ago  Substance Use Topics  . Alcohol use: Not Currently    Comment: 11/29/2019: drank 1.5 bottles of wine and 1/2 bottle of vodka mixed withj juidce  . Drug use: No    Home Medications Prior to Admission medications   Medication Sig Start Date End Date Taking? Authorizing Provider  amoxicillin (AMOXIL) 875 MG tablet Take 1 tablet (875 mg total) by mouth 2 (two) times daily. 02/11/20   Chrismon, Vickki Muff, PA  EPINEPHrine 0.3 mg/0.3 mL IJ SOAJ injection Inject 0.3 mLs (0.3 mg total) into the muscle as needed for anaphylaxis. 02/11/20   Chrismon, Vickki Muff, PA  famotidine (PEPCID) 20 MG tablet Take 1 tablet (20 mg total) by mouth 2 (two) times daily. 01/28/20   Paulette Blanch, MD  lactulose (CHRONULAC) 10 GM/15ML solution Take 30 mLs (20 g total) by mouth daily as needed for mild constipation. Patient not taking: Reported on 02/11/2020 01/28/20   Paulette Blanch, MD  levonorgestrel [facility]) 20 MCG/24HR IUD 1 each by Intrauterine route once.    [provider]  Multiple Vitamins-Minerals (ONE DAILY MULTIVITAMIN ADULT) TABS Take  1 tablet by mouth daily. Patient not taking: Reported on 02/11/2020    [provider]  sertraline (ZOLOFT) 50 MG tablet Take by mouth. 01/20/20   [provider]  sucralfate (CARAFATE) 1 GM/10ML suspension Take 10 mLs (1 g total) by mouth 4 (four) times daily. 01/28/20   Irean Hong, MD  traMADol (ULTRAM) 50 MG tablet Take 1 tablet (50 mg total) by mouth every 6 (six) hours as needed. 02/19/20 02/18/21  Evon Slack, PA-C    Allergies    Peanuts [peanut oil] and Mushroom extract complex  Review of Systems   Review of Systems  Constitutional: Negative for chills and fever.  Respiratory: Negative for shortness of breath.   Gastrointestinal: Negative for nausea and vomiting.  Musculoskeletal: Positive for arthralgias, gait problem and joint swelling. Negative for back pain, myalgias, neck pain and neck  stiffness.  Skin: Negative for wound.  Neurological: Negative for dizziness, light-headedness and headaches.    Physical Exam Updated Vital Signs BP 104/68 (BP Location: Right Arm)   Pulse 80   Temp 98.7 F (37.1 C) (Oral)   Resp 18   Ht 5\' 10"  (1.778 m)   Wt 58.5 kg   SpO2 99%   BMI 18.51 kg/m   Physical Exam Constitutional:      Appearance: She is well-developed.  HENT:     Head: Normocephalic and atraumatic.  Eyes:     Conjunctiva/sclera: Conjunctivae normal.  Cardiovascular:     Rate and Rhythm: Normal rate.  Pulmonary:     Effort: Pulmonary effort is normal. No respiratory distress.  Musculoskeletal:     Cervical back: Normal range of motion.     Comments: Right ankle with swelling along the ATFL as well as along the dorsum of the foot along the talus.  No tenderness along the fifth metatarsal.  No tenderness along the calcaneus or Achilles tendon.  Sensation intact distally.  No skin breakdown noted.  No proximal tib-fib discomfort.  Skin:    General: Skin is warm.     Findings: No rash.  Neurological:     Mental Status: She is alert and oriented to person, place, and time.  Psychiatric:        Behavior: Behavior normal.        Thought Content: Thought content normal.     ED Results / Procedures / Treatments   Labs (all labs ordered are listed, but only abnormal results are displayed) Labs Reviewed - No data to display  EKG None  Radiology DG Ankle Complete Right  Result Date: 02/19/2020 CLINICAL DATA:  Ankle and foot pain, injury EXAM: RIGHT ANKLE - COMPLETE 3+ VIEW COMPARISON:  None. FINDINGS: There is no evidence of fracture, dislocation, or joint effusion. There is no evidence of arthropathy or other focal bone abnormality. Soft tissues are unremarkable. IMPRESSION: Negative. Electronically Signed   By: 02/21/2020 M.D.   On: 02/19/2020 22:50   DG Foot Complete Right  Result Date: 02/19/2020 CLINICAL DATA:  Foot and ankle pain, injury EXAM: RIGHT  FOOT COMPLETE - 3+ VIEW COMPARISON:  None. FINDINGS: Small avulsed fragment off the anterior aspect of the talus. No additional fracture. No subluxation or dislocation. Soft tissues are intact. IMPRESSION: Small avulsed fragment off the anterior talus. Electronically Signed   By: 02/21/2020 M.D.   On: 02/19/2020 22:51    Procedures Procedures (including critical care time)  Medications Ordered in ED Medications  HYDROcodone-acetaminophen (NORCO/VICODIN) 5-325 MG per tablet 1 tablet (1 tablet Oral  Given 02/19/20 2220)    ED Course  I have reviewed the triage vital signs and the nursing notes.  Pertinent labs & imaging results that were available during my care of the patient were reviewed by me and considered in my medical decision making (see chart for details).    MDM Rules/Calculators/A&P                          24 year old female with right ankle sprain.  History of inversion injury with pain along the ATFL ligament and along the dorsum of the foot along the talus.  X-rays show avulsion along the talus.  She is placed into a ASO brace.  She will rest ice elevate.  She will use crutches.  She is given prescription for tramadol.  She will take Tylenol for mild pain.  She understands signs symptoms return to ED for.  She will follow-up with orthopedics if no improvement 1 week. Final Clinical Impression(s) / ED Diagnoses Final diagnoses:  Closed nondisplaced avulsion fracture of right talus, initial encounter  Sprain of anterior talofibular ligament of right ankle, initial encounter    Rx / DC Orders ED Discharge Orders         Ordered    traMADol (ULTRAM) 50 MG tablet  Every 6 hours PRN     Discontinue  Reprint     02/19/20 2311           Evon Slack, PA-C 02/19/20 2317    Chesley Noon, MD 02/20/20 [phone]

## 2020-02-25 ENCOUNTER — Other Ambulatory Visit: Payer: Self-pay | Admitting: Physician Assistant

## 2020-02-25 DIAGNOSIS — F332 Major depressive disorder, recurrent severe without psychotic features: Secondary | ICD-10-CM

## 2020-02-25 MED ORDER — SERTRALINE HCL 50 MG PO TABS: 50.0000 mg | ORAL_TABLET | Freq: Every day | ORAL | 0 refills | Status: DC

## 2020-02-29 ENCOUNTER — Encounter: Payer: Self-pay | Admitting: Emergency Medicine

## 2020-02-29 ENCOUNTER — Emergency Department: Payer: 59

## 2020-02-29 ENCOUNTER — Emergency Department
Admission: EM | Admit: 2020-02-29 | Discharge: 2020-03-01 | Disposition: A | Discharge status: Other Acute Inpatient Hospital | Payer: 59 | Attending: Emergency Medicine | Admitting: Emergency Medicine

## 2020-02-29 ENCOUNTER — Other Ambulatory Visit: Payer: Self-pay

## 2020-02-29 DIAGNOSIS — R569 Unspecified convulsions: Secondary | ICD-10-CM | POA: Diagnosis present

## 2020-02-29 DIAGNOSIS — M25571 Pain in right ankle and joints of right foot: Secondary | ICD-10-CM | POA: Insufficient documentation

## 2020-02-29 DIAGNOSIS — Z91010 Allergy to peanuts: Secondary | ICD-10-CM | POA: Diagnosis not present

## 2020-02-29 DIAGNOSIS — T71162A Asphyxiation due to hanging, intentional self-harm, initial encounter: Secondary | ICD-10-CM | POA: Diagnosis present

## 2020-02-29 DIAGNOSIS — Z20822 Contact with and (suspected) exposure to covid-19: Secondary | ICD-10-CM | POA: Insufficient documentation

## 2020-02-29 DIAGNOSIS — F332 Major depressive disorder, recurrent severe without psychotic features: Secondary | ICD-10-CM | POA: Diagnosis present

## 2020-02-29 DIAGNOSIS — R45851 Suicidal ideations: Secondary | ICD-10-CM

## 2020-02-29 DIAGNOSIS — F1010 Alcohol abuse, uncomplicated: Secondary | ICD-10-CM | POA: Diagnosis present

## 2020-02-29 DIAGNOSIS — M25579 Pain in unspecified ankle and joints of unspecified foot: Secondary | ICD-10-CM

## 2020-02-29 DIAGNOSIS — F32A Depression, unspecified: Secondary | ICD-10-CM | POA: Diagnosis present

## 2020-02-29 DIAGNOSIS — G40909 Epilepsy, unspecified, not intractable, without status epilepticus: Secondary | ICD-10-CM

## 2020-02-29 DIAGNOSIS — IMO0002 Reserved for concepts with insufficient information to code with codable children: Secondary | ICD-10-CM

## 2020-02-29 LAB — BASIC METABOLIC PANEL
Anion gap: 11 (ref 5–15)
BUN: 11 mg/dL (ref 6–20)
CO2: 26 mmol/L (ref 22–32)
Calcium: 9.6 mg/dL (ref 8.9–10.3)
Chloride: 103 mmol/L (ref 98–111)
Creatinine, Ser: 0.87 mg/dL (ref 0.44–1.00)
GFR calc Af Amer: >60 mL/min (ref >60)
GFR calc non Af Amer: >60 mL/min (ref >60)
Glucose, Bld: 92 mg/dL (ref 70–99)
Potassium: 3.2 mmol/L — ABNORMAL LOW (ref 3.5–5.1)
Sodium: 140 mmol/L (ref 135–145)

## 2020-02-29 LAB — CBC
HCT: 40.3 % (ref 36.0–46.0)
Hemoglobin: 13.4 g/dL (ref 12.0–15.0)
MCH: 27.6 pg (ref 26.0–34.0)
MCHC: 33.3 g/dL (ref 30.0–36.0)
MCV: 82.9 fL (ref 80.0–100.0)
Platelets: 210 10*3/uL (ref 150–400)
RBC: 4.86 MIL/uL (ref 3.87–5.11)
RDW: 12.3 % (ref 11.5–15.5)
WBC: 5.1 10*3/uL (ref 4.0–10.5)
nRBC: 0 % (ref 0.0–0.2)

## 2020-02-29 LAB — TROPONIN I (HIGH SENSITIVITY): Troponin I (High Sensitivity): <2 ng/L (ref <18)

## 2020-02-29 LAB — URINE DRUG SCREEN, QUALITATIVE (ARMC ONLY)
Amphetamines, Ur Screen: NOT DETECTED
Barbiturates, Ur Screen: NOT DETECTED
Benzodiazepine, Ur Scrn: NOT DETECTED
Cannabinoid 50 Ng, Ur ~~LOC~~: NOT DETECTED
Cocaine Metabolite,Ur ~~LOC~~: NOT DETECTED
MDMA (Ecstasy)Ur Screen: NOT DETECTED
Methadone Scn, Ur: NOT DETECTED
Opiate, Ur Screen: NOT DETECTED
Phencyclidine (PCP) Ur S: NOT DETECTED
Tricyclic, Ur Screen: NOT DETECTED

## 2020-02-29 LAB — ACETAMINOPHEN LEVEL: Acetaminophen (Tylenol), Serum: <10 ug/mL — ABNORMAL LOW (ref 10–30)

## 2020-02-29 LAB — ETHANOL: Alcohol, Ethyl (B): <10 mg/dL (ref <10)

## 2020-02-29 LAB — POCT PREGNANCY, URINE: Preg Test, Ur: NEGATIVE

## 2020-02-29 LAB — SALICYLATE LEVEL: Salicylate Lvl: <7 mg/dL — ABNORMAL LOW (ref 7.0–30.0)

## 2020-02-29 MED ORDER — SODIUM CHLORIDE 0.9% FLUSH: 3.0000 mL | Freq: Once | INTRAVENOUS | Status: DC

## 2020-02-29 MED ORDER — SUCRALFATE 1 GM/10ML PO SUSP
1.0000 g | Freq: Four times a day (QID) | ORAL | Status: DC
  Administered 2020-03-01 (×2): 1 g via ORAL
  Filled 2020-02-29 (×6): qty 10

## 2020-02-29 MED ORDER — ACETAMINOPHEN 325 MG PO TABS: 650.0000 mg | ORAL_TABLET | ORAL | Status: DC | PRN

## 2020-02-29 MED ORDER — ALUM & MAG HYDROXIDE-SIMETH 200-200-20 MG/5ML PO SUSP: 30.0000 mL | Freq: Four times a day (QID) | ORAL | Status: DC | PRN

## 2020-02-29 MED ORDER — IBUPROFEN 600 MG PO TABS
600.0000 mg | ORAL_TABLET | Freq: Once | ORAL | Status: AC
  Administered 2020-03-01: 600 mg via ORAL
  Filled 2020-02-29: qty 1

## 2020-02-29 MED ORDER — FAMOTIDINE 20 MG PO TABS
20.0000 mg | ORAL_TABLET | Freq: Two times a day (BID) | ORAL | Status: DC
  Administered 2020-03-01: 20 mg via ORAL
  Filled 2020-02-29: qty 1

## 2020-02-29 MED ORDER — LORAZEPAM 2 MG/ML IJ SOLN
1.0000 mg | Freq: Once | INTRAMUSCULAR | Status: DC
  Filled 2020-02-29: qty 1

## 2020-02-29 MED ORDER — LORAZEPAM 0.5 MG PO TABS: 0.5000 mg | ORAL_TABLET | Freq: Once | ORAL | Status: DC

## 2020-02-29 NOTE — ED Triage Notes (Signed)
Pt reports that she is feeling suicidal. She also reports that she did attempt suicide in April and was resuscitated by EMS.She states that she does have a plan, but that she is not intent on acting on it today.

## 2020-02-29 NOTE — ED Notes (Signed)
First Nurse Note:  Pt in via [facility] from [facility]; call out for possible seizure activity w/ hx of same.  Per EMS, pt A/Ox4 on scene and upon arrival.    Vitals WDL. CBG 102.

## 2020-02-29 NOTE — ED Provider Notes (Signed)
I assumed care of the patient from Dr. Erma Heritage at 11:00 PM.  While receiving report on the patient the patient attempted to run out from the psychiatric area of the emergency department.  Patient involuntarily committed.  I spoke with the patient and she agreed to return to the room.  After returning to the room the patient went to the restroom and attempted to tie a sock around her neck.   Darci Current, MD 02/29/20 [phone]

## 2020-02-29 NOTE — ED Triage Notes (Signed)
Pt via ems from school with seizure activity. Pt states she began to feel like she has felt in the past before she had a seizure, and left class. She said she then had 2 seizures (withnessed by security guard) and EMS was called to bring her in. No loss of bowel or bladder control during seizure. Pt reports that she has recent onset of seizures - first one was during a visit to this ED in the past. Pt alert & oriented, nad noted.

## 2020-02-29 NOTE — ED Notes (Addendum)
1 Blue hat 1 gray bag 2 gray crutches 1 black sock 2 gray and orange shoes 1 white watch 1 black bracelet 1 orange shirt 1 jean 1 black belt 1 gray necklace with heart pendent  1 purple ring 1 black tablet placed by pt into her gray bag.  1 black phone  1 blue brief    Pt keeping black brace on broken right foot as well as black glasses. Pt placed into burgundy scrubs, briefs and 2 non slip socks.    Pt moved to room 22.

## 2020-02-29 NOTE — ED Provider Notes (Signed)
[facility] Emergency Department Provider Note  ____________________________________________   First MD Initiated Contact with Patient 02/29/20 2211     (approximate)  I have reviewed the triage vital signs and the nursing notes.   HISTORY  Chief Complaint Seizures and Suicidal    HPI [name] is a 24 y.o. female  Here with multiple complaints. Pt's primary complaint is increasing stress over the past few days, causing what she describes as three seizure like episodes today. She states that she feels the seizures coming on as a warmth sensation throughout her left body then "blacking out," though no loss of bowel or bladder or tongue biting. She also reports she's had increasing suicidal ideation and thoughts of harming herself, though she is somewhat evasive on questioning. She reports that she doesn't want to get into it because "bad things would happen." However, she also states that she thinks about killing herself and worries that her children wouldn't be enough to stop her. She has a h/o prior suicide attempt via hanging.       Past Medical History:  Diagnosis Date   Anxiety    Depression    Gastritis    Heart murmur    Sickle cell trait [facility])     Patient Active Problem List   Diagnosis Date Noted   Witnessed seizure-like activity ([facility]) 02/07/2020   Syncope 02/07/2020   URTI (acute upper respiratory infection) 02/07/2020   Lactic acidosis 02/07/2020   Recurrent seizures ([facility]) 02/07/2020   Alcohol abuse 01/07/2020   Suicide attempt by hanging ([facility]) 01/07/2020   H/O self-harm 01/07/2020   Severe recurrent major depression without psychotic features ([facility]) 11/30/2019   Encounter for planned induction of labor 11/06/2018   Anemia affecting pregnancy, antepartum 10/02/2018   Circumvallate placenta, unspecified trimester 09/04/2018   Depression 04/17/2018   Supervision of other normal pregnancy, antepartum 03/25/2018      Past Surgical History:  Procedure Laterality Date   NO PAST SURGERIES      Prior to Admission medications   Medication Sig Start Date End Date Taking? Authorizing Provider  amoxicillin (AMOXIL) 875 MG tablet Take 1 tablet (875 mg total) by mouth 2 (two) times daily. 02/11/20   Chrismon, Jodell Cipro, PA  EPINEPHrine 0.3 mg/0.3 mL IJ SOAJ injection Inject 0.3 mLs (0.3 mg total) into the muscle as needed for anaphylaxis. 02/11/20   Chrismon, Jodell Cipro, PA  famotidine (PEPCID) 20 MG tablet Take 1 tablet (20 mg total) by mouth 2 (two) times daily. 01/28/20   Irean Hong, MD  lactulose (CHRONULAC) 10 GM/15ML solution Take 30 mLs (20 g total) by mouth daily as needed for mild constipation. Patient not taking: Reported on 02/11/2020 01/28/20   Irean Hong, MD  levonorgestrel [facility]) 20 MCG/24HR IUD 1 each by Intrauterine route once.    [provider]  Multiple Vitamins-Minerals (ONE DAILY MULTIVITAMIN ADULT) TABS Take 1 tablet by mouth daily. Patient not taking: Reported on 02/11/2020    [provider]  sertraline (ZOLOFT) 50 MG tablet Take 1 tablet (50 mg total) by mouth daily. 02/25/20 05/25/20  Trey Sailors, PA-C  sucralfate (CARAFATE) 1 GM/10ML suspension Take 10 mLs (1 g total) by mouth 4 (four) times daily. 01/28/20   Irean Hong, MD  traMADol (ULTRAM) 50 MG tablet Take 1 tablet (50 mg total) by mouth every 6 (six) hours as needed. 02/19/20 02/18/21  Evon Slack, PA-C    Allergies Peanuts [peanut oil] and Mushroom extract complex  Family History  Problem Relation Age of Onset   Hypertension Maternal Grandmother    Healthy Mother    Healthy Father     Social History Social History   Tobacco Use   Smoking status: Never Smoker   Smokeless tobacco: Never Used  Building services engineer Use: Former   Devices: vaped every day until a week ago  Substance Use Topics   Alcohol use: Not Currently    Comment: 11/29/2019: drank 1.5 bottles of wine and 1/2 bottle of  vodka mixed withj juidce   Drug use: No    Review of Systems  Review of Systems  Constitutional: Negative for chills and fever.  HENT: Negative for sore throat.   Respiratory: Negative for shortness of breath.   Cardiovascular: Negative for chest pain.  Gastrointestinal: Negative for abdominal pain.  Genitourinary: Negative for flank pain.  Musculoskeletal: Negative for neck pain.  Skin: Negative for rash and wound.  Allergic/Immunologic: Negative for immunocompromised state.  Neurological: Negative for weakness and numbness.  Hematological: Does not bruise/bleed easily.  Psychiatric/Behavioral: Positive for suicidal ideas.  All other systems reviewed and are negative.    ____________________________________________  PHYSICAL EXAM:      VITAL SIGNS: ED Triage Vitals  Enc Vitals Group     BP 02/29/20 1249 (!) 120/59     Pulse Rate 02/29/20 1249 71     Resp 02/29/20 1249 18     Temp 02/29/20 1249 98.7 F (37.1 C)     Temp Source 02/29/20 1249 Oral     SpO2 02/29/20 1249 100 %     Weight 02/29/20 1251 129 lb (58.5 kg)     Height 02/29/20 1251 5\' 10"  (1.778 m)     Head Circumference --      Peak Flow --      Pain Score 02/29/20 1251 5     Pain Loc --      Pain Edu? --      Excl. in GC? --      Physical Exam Vitals and nursing note reviewed.  Constitutional:      General: She is not in acute distress.    Appearance: She is well-developed.  HENT:     Head: Normocephalic and atraumatic.  Eyes:     Conjunctiva/sclera: Conjunctivae normal.  Cardiovascular:     Rate and Rhythm: Normal rate and regular rhythm.     Heart sounds: Normal heart sounds. No murmur heard.  No friction rub.  Pulmonary:     Effort: Pulmonary effort is normal. No respiratory distress.     Breath sounds: Normal breath sounds. No wheezing or rales.  Abdominal:     General: There is no distension.     Palpations: Abdomen is soft.     Tenderness: There is no abdominal tenderness.    Musculoskeletal:     Cervical back: Neck supple.  Skin:    General: Skin is warm.     Capillary Refill: Capillary refill takes less than 2 seconds.  Neurological:     Mental Status: She is alert and oriented to person, place, and time.     Motor: No abnormal muscle tone.  Psychiatric:        Mood and Affect: Mood is anxious and depressed.        Thought Content: Thought content includes suicidal ideation. Thought content does not include suicidal plan.       ____________________________________________   LABS (all labs ordered are listed, but only abnormal results are displayed)  Labs Reviewed  BASIC METABOLIC PANEL - Abnormal; Notable for the following components:      Result Value   Potassium 3.2 (*)    All other components within normal limits  SALICYLATE LEVEL - Abnormal; Notable for the following components:   Salicylate Lvl <7.0 (*)    All other components within normal limits  ACETAMINOPHEN LEVEL - Abnormal; Notable for the following components:   Acetaminophen (Tylenol), Serum <10 (*)    All other components within normal limits  CBC  ETHANOL  URINE DRUG SCREEN, QUALITATIVE ([facility] ONLY)  POC URINE PREG, ED  POC URINE PREG, ED  POCT PREGNANCY, URINE  TROPONIN I (HIGH SENSITIVITY)    ____________________________________________  EKG: None ________________________________________  RADIOLOGY All imaging, including plain films, CT scans, and ultrasounds, independently reviewed by me, and interpretations confirmed via formal radiology reads.  ED MD interpretation:   CXR: Clear XR Ankle:   Official radiology report(s): DG Chest 2 View  Result Date: 02/29/2020 CLINICAL DATA:  Chest pain.  Seizure. EXAM: CHEST - 2 VIEW COMPARISON:  February 06, 2020 FINDINGS: Lungs are clear. Heart size and pulmonary vascularity are normal. No adenopathy. No pneumothorax. No bone lesions. IMPRESSION: Lungs clear.  Heart size normal.  No evident pneumothorax. Electronically Signed    By: Bretta Bang III M.D.   On: 02/29/2020 15:28    ____________________________________________  PROCEDURES   Procedure(s) performed (including Critical Care):  Procedures  ____________________________________________  INITIAL IMPRESSION / MDM / ASSESSMENT AND PLAN / ED COURSE  As part of my medical decision making, I reviewed the following data within the electronic MEDICAL RECORD NUMBER Nursing notes reviewed and incorporated, Old chart reviewed, Notes from prior ED visits, and [place] Controlled Substance Database       *Tianne Plott was evaluated in Emergency Department on 02/29/2020 for the symptoms described in the history of present illness. She was evaluated in the context of the global COVID-19 pandemic, which necessitated consideration that the patient might be at risk for infection with the SARS-CoV-2 virus that causes COVID-19. Institutional protocols and algorithms that pertain to the evaluation of patients at risk for COVID-19 are in a state of rapid change based on information released by regulatory bodies including the CDC and federal and state organizations. These policies and algorithms were followed during the patient's care in the ED.  Some ED evaluations and interventions may be delayed as a result of limited staffing during the pandemic.*     Medical Decision Making:  24 yo F here with suicidal ideation, reported seizure like activity. 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 She has no evidence of trauma related to the seizures and is neurologically intact. She has been cleared by Neurology in the past. Do not feel admission indicated. More importantly, suspect her seizures are related to increasing stressors and depression. She has passive but some active suicidal ideation. She was initially somewhat evasive on questioning, but after speaking with me and TTS, was initially amenable to psych eval and voluntary  admission.  Following this, patient attempted to elope. Given her reported SI and high risk history, IVC placed and pt taken back to her room safely. She is high risk of danger to self with h/o recent suicide attempt. Will give ativan. She has a recent ankle sprain, with some swelling after attempting to leave - no new trauma. XR pending, motrin ordered.   The patient has been placed in psychiatric observation due to  the need to provide a safe environment for the patient while obtaining psychiatric consultation and evaluation, as well as ongoing medical and medication management to treat the patient's condition.  The patient has been placed under full IVC at this time.  ____________________________________________  FINAL CLINICAL IMPRESSION(S) / ED DIAGNOSES  Final diagnoses:  Ankle pain  Suicidal ideation  Suicidal thoughts     MEDICATIONS GIVEN DURING THIS VISIT:  Medications  sodium chloride flush (NS) 0.9 % injection 3 mL (has no administration in time range)  LORazepam (ATIVAN) injection 1 mg (has no administration in time range)  ibuprofen (ADVIL) tablet 600 mg (has no administration in time range)     ED Discharge Orders    None       Note:  This document was prepared using Dragon voice recognition software and may include unintentional dictation errors.   Shaune PollackIsaacs, Mickle Campton, MD 02/29/20 [phone]

## 2020-03-01 ENCOUNTER — Other Ambulatory Visit: Payer: Self-pay

## 2020-03-01 ENCOUNTER — Inpatient Hospital Stay
Admission: AD | Admit: 2020-03-01 | Discharge: 2020-03-02 | DRG: 882 | Disposition: A | Discharge status: Home / Self Care | Payer: 59 | Source: Intra-hospital | Attending: Psychiatry | Admitting: Psychiatry

## 2020-03-01 ENCOUNTER — Inpatient Hospital Stay: Payer: 59

## 2020-03-01 ENCOUNTER — Encounter: Payer: Self-pay | Admitting: Internal Medicine

## 2020-03-01 DIAGNOSIS — S99911A Unspecified injury of right ankle, initial encounter: Secondary | ICD-10-CM | POA: Diagnosis present

## 2020-03-01 DIAGNOSIS — S92151A Displaced avulsion fracture (chip fracture) of right talus, initial encounter for closed fracture: Secondary | ICD-10-CM | POA: Diagnosis present

## 2020-03-01 DIAGNOSIS — F4310 Post-traumatic stress disorder, unspecified: Secondary | ICD-10-CM | POA: Diagnosis present

## 2020-03-01 DIAGNOSIS — R45851 Suicidal ideations: Secondary | ICD-10-CM | POA: Diagnosis present

## 2020-03-01 DIAGNOSIS — Z20822 Contact with and (suspected) exposure to covid-19: Secondary | ICD-10-CM | POA: Diagnosis present

## 2020-03-01 DIAGNOSIS — Z915 Personal history of self-harm: Secondary | ICD-10-CM | POA: Diagnosis not present

## 2020-03-01 DIAGNOSIS — K219 Gastro-esophageal reflux disease without esophagitis: Secondary | ICD-10-CM | POA: Diagnosis present

## 2020-03-01 DIAGNOSIS — F1010 Alcohol abuse, uncomplicated: Secondary | ICD-10-CM | POA: Diagnosis present

## 2020-03-01 DIAGNOSIS — K5900 Constipation, unspecified: Secondary | ICD-10-CM | POA: Diagnosis present

## 2020-03-01 DIAGNOSIS — X501XXA Overexertion from prolonged static or awkward postures, initial encounter: Secondary | ICD-10-CM | POA: Diagnosis not present

## 2020-03-01 DIAGNOSIS — K259 Gastric ulcer, unspecified as acute or chronic, without hemorrhage or perforation: Secondary | ICD-10-CM | POA: Diagnosis present

## 2020-03-01 DIAGNOSIS — F332 Major depressive disorder, recurrent severe without psychotic features: Secondary | ICD-10-CM | POA: Diagnosis not present

## 2020-03-01 DIAGNOSIS — F339 Major depressive disorder, recurrent, unspecified: Secondary | ICD-10-CM | POA: Diagnosis present

## 2020-03-01 DIAGNOSIS — S92201A Fracture of unspecified tarsal bone(s) of right foot, initial encounter for closed fracture: Secondary | ICD-10-CM

## 2020-03-01 DIAGNOSIS — Z9119 Patient's noncompliance with other medical treatment and regimen: Secondary | ICD-10-CM

## 2020-03-01 LAB — SARS CORONAVIRUS 2 BY RT PCR (HOSPITAL ORDER, PERFORMED IN ~~LOC~~ HOSPITAL LAB): SARS Coronavirus 2: NEGATIVE

## 2020-03-01 MED ORDER — SERTRALINE HCL 25 MG PO TABS
50.0000 mg | ORAL_TABLET | Freq: Every day | ORAL | Status: DC
  Administered 2020-03-02: 50 mg via ORAL
  Filled 2020-03-01: qty 2

## 2020-03-01 MED ORDER — TRAZODONE HCL 50 MG PO TABS
50.0000 mg | ORAL_TABLET | Freq: Every evening | ORAL | Status: DC | PRN
  Administered 2020-03-01: 50 mg via ORAL
  Filled 2020-03-01: qty 1

## 2020-03-01 MED ORDER — SERTRALINE HCL 25 MG PO TABS: 50.0000 mg | ORAL_TABLET | Freq: Every day | ORAL | Status: DC

## 2020-03-01 MED ORDER — ACETAMINOPHEN 325 MG PO TABS
650.0000 mg | ORAL_TABLET | Freq: Four times a day (QID) | ORAL | Status: DC | PRN
  Administered 2020-03-02: 650 mg via ORAL
  Filled 2020-03-01: qty 2

## 2020-03-01 MED ORDER — MAGNESIUM HYDROXIDE 400 MG/5ML PO SUSP: 30.0000 mL | Freq: Every day | ORAL | Status: DC | PRN

## 2020-03-01 MED ORDER — IBUPROFEN 600 MG PO TABS
600.0000 mg | ORAL_TABLET | Freq: Once | ORAL | Status: AC
  Administered 2020-03-01: 600 mg via ORAL
  Filled 2020-03-01: qty 1

## 2020-03-01 MED ORDER — LORAZEPAM 1 MG PO TABS
1.0000 mg | ORAL_TABLET | Freq: Once | ORAL | Status: AC
  Administered 2020-03-01: 1 mg via ORAL
  Filled 2020-03-01: qty 1

## 2020-03-01 MED ORDER — IBUPROFEN 600 MG PO TABS
800.0000 mg | ORAL_TABLET | Freq: Four times a day (QID) | ORAL | Status: DC | PRN
  Administered 2020-03-01 – 2020-03-02 (×2): 800 mg via ORAL
  Filled 2020-03-01 (×2): qty 1

## 2020-03-01 MED ORDER — ONDANSETRON 4 MG PO TBDP: 4.0000 mg | ORAL_TABLET | Freq: Once | ORAL | Status: DC

## 2020-03-01 NOTE — ED Notes (Signed)
Pt brought into ED BHU via sally port and wand with metal detector for safety by [facility]. Patient oriented to unit/care area: Pt informed of unit policies and procedures.  Informed that, for their safety, care areas are designed for safety and monitored by security cameras at all times. Patient verbalizes understanding. Unable to obtain contract for safety at this time. Pt encouraged to speak to RN if she changes her mind about making verbal contract for safety. Pt shown to their room.
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
## 2020-03-01 NOTE — Progress Notes (Signed)
Patient alert and oriented. She is cooperative and easy to engage. She received scheduled meds and tolerated without incident. She denied SI/HI/AVH depression and anxiety at this encounter. Reports pain from ankle rated 10 and received meds for complaint. She remains safe at this time with 15 minute safety checks and informed to contact staff with any concerns.     Cleo Butler-Nicholson, LPN

## 2020-03-01 NOTE — BHH Suicide Risk Assessment (Signed)
[facility] Admission Suicide Risk Assessment   Nursing information obtained from:    Demographic factors:    Current Mental Status:    Loss Factors:    Historical Factors:    Risk Reduction Factors:     Total Time spent with patient: 1 hour Principal Problem: PTSD (post-traumatic stress disorder) Diagnosis:  Principal Problem:   PTSD (post-traumatic stress disorder) Active Problems:   Alcohol abuse   Major depression, recurrent, chronic (HCC)   Fracture of tarsal bone of right foot  Subjective Data: Patient seen chart reviewed.  24 year old woman who came to the emergency room initially for seizures but who upon evaluation was still having symptoms of anxiety and depression including passive suicidal ideation.  Patient on interview today continues to endorse anxiety and depression symptoms with feelings that she would not mind if she were to die but denies any actual intent or plan to harm her self.  Denies any psychotic symptoms.  Patient is cooperative and appears to be lucid with good judgment.  Continued Clinical Symptoms:    The "Alcohol Use Disorders Identification Test", Guidelines for Use in Primary Care, Second Edition.  World Science writer [facility]). Score between 0-7:  no or low risk or alcohol related problems. Score between 8-15:  moderate risk of alcohol related problems. Score between 16-19:  high risk of alcohol related problems. Score 20 or above:  warrants further diagnostic evaluation for alcohol dependence and treatment.   CLINICAL FACTORS:   Severe Anxiety and/or Agitation Alcohol/Substance Abuse/Dependencies   Musculoskeletal: Strength & Muscle Tone: within normal limits Gait & Station: Unsteady because of recent fracture of the right foot Patient leans: Right  Psychiatric Specialty Exam: Physical Exam Vitals and nursing note reviewed.  Constitutional:      Appearance: She is well-developed.  HENT:     Head: Normocephalic and atraumatic.  Eyes:      Conjunctiva/sclera: Conjunctivae normal.     Pupils: Pupils are equal, round, and reactive to light.  Cardiovascular:     Heart sounds: Normal heart sounds.  Pulmonary:     Effort: Pulmonary effort is normal.  Abdominal:     Palpations: Abdomen is soft.  Musculoskeletal:        General: Normal range of motion.     Cervical back: Normal range of motion.     Right foot: Tenderness present.  Skin:    General: Skin is warm and dry.  Neurological:     General: No focal deficit present.     Mental Status: She is alert.  Psychiatric:        Attention and Perception: Attention normal.        Mood and Affect: Mood is anxious.        Speech: Speech is rapid and pressured.        Behavior: Behavior is agitated. Behavior is not aggressive.        Thought Content: Thought content is not paranoid. Thought content includes suicidal ideation. Thought content does not include homicidal ideation. Thought content does not include suicidal plan.        Cognition and Memory: Cognition normal.        Judgment: Judgment normal.     Review of Systems  Constitutional: Negative.   HENT: Negative.   Eyes: Negative.   Respiratory: Negative.   Cardiovascular: Negative.   Gastrointestinal: Negative.   Musculoskeletal: Negative.   Skin: Negative.   Neurological: Negative.   Psychiatric/Behavioral: Positive for dysphoric mood. The patient is nervous/anxious.  Blood pressure 114/69, pulse 74, temperature 98.5 F (36.9 C), temperature source Oral, resp. rate 18, SpO2 100 %, currently breastfeeding.There is no height or weight on file to calculate BMI.  General Appearance: Casual  Eye Contact:  Fair  Speech:  Clear and Coherent and Pressured  Volume:  Increased  Mood:  Anxious  Affect:  Congruent  Thought Process:  Coherent  Orientation:  Full (Time, Place, and Person)  Thought Content:  Logical  Suicidal Thoughts:  Yes.  without intent/plan  Homicidal Thoughts:  No  Memory:  Immediate;    Fair Recent;   Fair Remote;   Fair  Judgement:  Fair  Insight:  Fair  Psychomotor Activity:  Normal  Concentration:  Concentration: Fair  Recall:  [employer] of Knowledge:  Fair  Language:  Fair  Akathisia:  No  Handed:  Right  AIMS (if indicated):     Assets:  Desire for Improvement Housing Resilience  ADL's:  Intact  Cognition:  WNL  Sleep:         COGNITIVE FEATURES THAT CONTRIBUTE TO RISK:  Thought constriction (tunnel vision)    SUICIDE RISK:   Minimal: No identifiable suicidal ideation.  Patients presenting with no risk factors but with morbid ruminations; may be classified as minimal risk based on the severity of the depressive symptoms  PLAN OF CARE: Continue 15-minute checks.  Restart medicine for depression and anxiety.  Case reviewed with treatment team.  Reassess for dangerousness and ensure we have outpatient follow-up arranged prior to discharge  I certify that inpatient services furnished can reasonably be expected to improve the patient's condition.   Mordecai Rasmussen, MD 03/01/2020, 5:20 PM
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
## 2020-03-01 NOTE — ED Notes (Signed)
Graham crackers and ginger ale given to patient at this time.

## 2020-03-01 NOTE — ED Notes (Signed)
Patient states she knows what she did was not smart and wish she had not done them.

## 2020-03-01 NOTE — ED Notes (Signed)
Patient to restroom ED went to check on patient, patient was behind door sitting in floor. Per ED tech patient had sock tied around neck. This Clinical research associate and ED tech removed sock from neck and assisted patient back to room. Writer and ED tech removed blankets and brace patient had on right ankle due to strangulation hazard. And moved to [facility] for close observation.  EDP made aware.

## 2020-03-01 NOTE — Plan of Care (Signed)
New admission  Problem: Education: Goal: Knowledge of [facility] Education information/materials will improve Outcome: Not Progressing Goal: Emotional status will improve Outcome: Not Progressing Goal: Mental status will improve Outcome: Not Progressing Goal: Verbalization of understanding the information provided will improve Outcome: Not Progressing   Problem: Activity: Goal: Interest or engagement in activities will improve Outcome: Not Progressing Goal: Sleeping patterns will improve Outcome: Not Progressing   Problem: Coping: Goal: Ability to verbalize frustrations and anger appropriately will improve Outcome: Not Progressing Goal: Ability to demonstrate self-control will improve Outcome: Not Progressing   Problem: Health Behavior/Discharge Planning: Goal: Identification of resources available to assist in meeting health care needs will improve Outcome: Not Progressing Goal: Compliance with treatment plan for underlying cause of condition will improve Outcome: Not Progressing   Problem: Physical Regulation: Goal: Ability to maintain clinical measurements within normal limits will improve Outcome: Not Progressing   Problem: Safety: Goal: Periods of time without injury will increase Outcome: Not Progressing   Problem: Education: Goal: Utilization of techniques to improve thought processes will improve Outcome: Not Progressing Goal: Knowledge of the prescribed therapeutic regimen will improve Outcome: Not Progressing   Problem: Activity: Goal: Interest or engagement in leisure activities will improve Outcome: Not Progressing Goal: Imbalance in normal sleep/wake cycle will improve Outcome: Not Progressing   Problem: Coping: Goal: Coping ability will improve Outcome: Not Progressing Goal: Will verbalize feelings Outcome: Not Progressing   Problem: Health Behavior/Discharge Planning: Goal: Ability to make decisions will improve Outcome: Not  Progressing Goal: Compliance with therapeutic regimen will improve Outcome: Not Progressing   Problem: Role Relationship: Goal: Will demonstrate positive changes in social behaviors and relationships Outcome: Not Progressing   Problem: Safety: Goal: Ability to disclose and discuss suicidal ideas will improve Outcome: Not Progressing Goal: Ability to identify and utilize support systems that promote safety will improve Outcome: Not Progressing   Problem: Self-Concept: Goal: Will verbalize positive feelings about self Outcome: Not Progressing Goal: Level of anxiety will decrease Outcome: Not Progressing   Problem: Education: Goal: Ability to state activities that reduce stress will improve Outcome: Not Progressing   Problem: Coping: Goal: Ability to identify and develop effective coping behavior will improve Outcome: Not Progressing   Problem: Self-Concept: Goal: Ability to identify factors that promote anxiety will improve Outcome: Not Progressing Goal: Level of anxiety will decrease Outcome: Not Progressing Goal: Ability to modify response to factors that promote anxiety will improve Outcome: Not Progressing   Problem: Education: Goal: Ability to make informed decisions regarding treatment will improve Outcome: Not Progressing   Problem: Coping: Goal: Coping ability will improve Outcome: Not Progressing   Problem: Health Behavior/Discharge Planning: Goal: Identification of resources available to assist in meeting health care needs will improve Outcome: Not Progressing   Problem: Medication: Goal: Compliance with prescribed medication regimen will improve Outcome: Not Progressing   Problem: Self-Concept: Goal: Ability to disclose and discuss suicidal ideas will improve Outcome: Not Progressing Goal: Will verbalize positive feelings about self Outcome: Not Progressing   

## 2020-03-01 NOTE — Plan of Care (Signed)
  Problem: Education: Goal: Knowledge of [facility] Education information/materials will improve Outcome: Progressing Goal: Emotional status will improve Outcome: Progressing Goal: Mental status will improve Outcome: Progressing Goal: Verbalization of understanding the information provided will improve Outcome: Progressing   Problem: Activity: Goal: Interest or engagement in activities will improve Outcome: Progressing Goal: Sleeping patterns will improve Outcome: Progressing   Problem: Coping: Goal: Ability to verbalize frustrations and anger appropriately will improve Outcome: Progressing Goal: Ability to demonstrate self-control will improve Outcome: Progressing   Problem: Health Behavior/Discharge Planning: Goal: Identification of resources available to assist in meeting health care needs will improve Outcome: Progressing Goal: Compliance with treatment plan for underlying cause of condition will improve Outcome: Progressing   Problem: Physical Regulation: Goal: Ability to maintain clinical measurements within normal limits will improve Outcome: Progressing   Problem: Safety: Goal: Periods of time without injury will increase Outcome: Progressing   Problem: Education: Goal: Utilization of techniques to improve thought processes will improve Outcome: Progressing Goal: Knowledge of the prescribed therapeutic regimen will improve Outcome: Progressing   Problem: Activity: Goal: Interest or engagement in leisure activities will improve Outcome: Progressing Goal: Imbalance in normal sleep/wake cycle will improve Outcome: Progressing   Problem: Coping: Goal: Coping ability will improve Outcome: Progressing Goal: Will verbalize feelings Outcome: Progressing   Problem: Health Behavior/Discharge Planning: Goal: Ability to make decisions will improve Outcome: Progressing Goal: Compliance with therapeutic regimen will improve Outcome: Progressing    Problem: Role Relationship: Goal: Will demonstrate positive changes in social behaviors and relationships Outcome: Progressing   Problem: Safety: Goal: Ability to disclose and discuss suicidal ideas will improve Outcome: Progressing Goal: Ability to identify and utilize support systems that promote safety will improve Outcome: Progressing   Problem: Self-Concept: Goal: Will verbalize positive feelings about self Outcome: Progressing Goal: Level of anxiety will decrease Outcome: Progressing   Problem: Education: Goal: Ability to state activities that reduce stress will improve Outcome: Progressing   Problem: Coping: Goal: Ability to identify and develop effective coping behavior will improve Outcome: Progressing   Problem: Self-Concept: Goal: Ability to identify factors that promote anxiety will improve Outcome: Progressing Goal: Level of anxiety will decrease Outcome: Progressing Goal: Ability to modify response to factors that promote anxiety will improve Outcome: Progressing   Problem: Education: Goal: Ability to make informed decisions regarding treatment will improve Outcome: Progressing   Problem: Coping: Goal: Coping ability will improve Outcome: Progressing   Problem: Health Behavior/Discharge Planning: Goal: Identification of resources available to assist in meeting health care needs will improve Outcome: Progressing   Problem: Medication: Goal: Compliance with prescribed medication regimen will improve Outcome: Progressing   Problem: Self-Concept: Goal: Ability to disclose and discuss suicidal ideas will improve Outcome: Progressing Goal: Will verbalize positive feelings about self Outcome: Progressing   

## 2020-03-01 NOTE — BH Assessment (Addendum)
Referral information for Inpatient Treatment have been faxed to;    [facility] [phone])    [facility] [phone] or [phone])    [facility] [phone]),    [facility] [phone]),    [facility] [phone] or [phone]),   . [facility] [phone])

## 2020-03-01 NOTE — ED Notes (Signed)
Pt being transferred to [facility], report given to [facility], Charity fundraiser.

## 2020-03-01 NOTE — Consult Note (Signed)
[facility] Face-to-Face Psychiatry Consult   Reason for Consult: Seizures and Suicidal Referring Physician:  Dr. Erma Heritage Patient Identification: [name] MRN:  [id number] Principal Diagnosis: <principal problem not specified> Diagnosis:  Active Problems:   Depression   Severe recurrent major depression without psychotic features (HCC)   Alcohol abuse   Suicide attempt by hanging (HCC)   H/O self-harm   Recurrent seizures (HCC)   Total Time spent with patient: 1 hour  Subjective: " Why can I leave?  I am a single parent and I need to be home with my kids." [name] is a 24 y.o. female patient presented to [facility] ED via [facility] from [facility] call out for possible seizure activity w/ hx of same. Per the ED triage nurse note, the patient states she began to feel like she had felt in the past before she had a seizure and left class. She said she then had two episodes (witnessed by the security guard), and EMS was called to bring her in. No loss of bowel or bladder control during the seizure. The patient reports that she has a recent onset of seizures - the first one was during a visit to this ED in the past. 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  The patient expressed she is a single mother with two boys, ages three years old and 44-year-old.  She voiced she care for them alone without any help from their dad.  She expressed she is currently in college, and it is difficult caring for two small children and attending college. The patient was moved from ED room 13 to the psychiatric area of the ED to room 22. It was reported from the EDP; while he was receiving a report on the patient, the patient attempted to run out from the psychiatric area of the emergency department.  The patient had been placed involuntarily commitment. The EDP spoke with the patient, and she agreed to return to the room.  After returning to the room, the  patient went into the restroom and attempted to tie a sock around her neck. The patient was seen face-to-face by this provider; the chart was reviewed and consulted with [name] on 02/29/2020 due to the patient's care. It was discussed with the EDP that the patient does meet the criteria to be admitted to the psychiatric inpatient unit.  On evaluation, the patient is alert and oriented x 4, attempted to elope by running out of the Guard and having the staff run after her, and she was brought back to the unit.   The patient does not appear to be responding to internal or external stimuli. The patient presents delusional thinking by going into the bathroom and tying a sock around her neck. The patient denies auditory or visual hallucinations. The patient attempted suicide by tieing a sock around her neck. She denies homicidal ideations. The patient is presenting with psychotic behaviors. During an encounter with the patient, she was able to answer questions appropriately. Collateral was obtained by the patient's mother, Ms. [name] [phone], who expresses her daughter's behavior concerns.  She voiced that her daughter's behavior is immaturity and that she has been waiting overnight hours in the emergency room.  She spoke that her daughter suffers from seizures and is overwhelmed with school, having two small children, and dealing with life's stressors.  She voiced that she did not think her daughter needed to be admitted to the inpatient unit.  She expressed that her daughter tends to overreact with situations such as tonight.   Plan: The patient is a safety risk to self and requires psychiatric inpatient admission for stabilization and treatment.  HPI: Per Dr. Jaquita Perkins is a 24 y.o. female  Here with multiple complaints. Pt's primary complaint is increasing stress over the past few days, causing what she describes as three seizure like episodes today. She states that she feels the  seizures coming on as a warmth sensation throughout her left body then "blacking out," though no loss of bowel or bladder or tongue biting. She also reports she's had increasing suicidal ideation and thoughts of harming herself, though she is somewhat evasive on questioning. She reports that she doesn't want to get into it because "bad things would happen." However, she also states that she thinks about killing herself and worries that her children wouldn't be enough to stop her. She has a h/o prior suicide attempt via hanging  Past Psychiatric History:  Anxiety Depression  Risk to Self: Suicidal Ideation: Yes-Currently Present Suicidal Intent: Yes-Currently Present Is patient at risk for suicide?: Yes Suicidal Plan?: Yes-Currently Present Specify Current Suicidal Plan: Pt reported plan to slit her wrists Access to Means: Yes Specify Access to Suicidal Means: Sharps What has been your use of drugs/alcohol within the last 12 months?: Wine/Liquor How many times?: 1 Other Self Harm Risks: Depression Triggers for Past Attempts: Unpredictable Intentional Self Injurious Behavior: None Risk to Others: Homicidal Ideation: No Thoughts of Harm to Others: No Current Homicidal Intent: No Current Homicidal Plan: No Access to Homicidal Means: No Identified Victim: n/a History of harm to others?: No Assessment of Violence: None Noted Violent Behavior Description: n/a Does patient have access to weapons?: No Criminal Charges Pending?: No Does patient have a court date: No Prior Inpatient Therapy: Prior Inpatient Therapy: Yes Prior Therapy Dates: 11-2019 Prior Therapy Facilty/Provider(s): [facility] Reason for Treatment: Suicide Attempt Prior Outpatient Therapy: Prior Outpatient Therapy: No Does patient have an ACCT team?: No Does patient have Intensive In-House Services?  : No Does patient have [facility] services? : No Does patient have P4CC services?: No  Past Medical History:  Past Medical History:   Diagnosis Date   Anxiety    Depression    Gastritis    Heart murmur    Sickle cell trait (HCC)     Past Surgical History:  Procedure Laterality Date   NO PAST SURGERIES     Family History:  Family History  Problem Relation Age of Onset   Hypertension Maternal Grandmother    Healthy Mother    Healthy Father    Family Psychiatric  History:  Social History:  Social History   Substance and Sexual Activity  Alcohol Use Not Currently   Comment: 11/29/2019: drank 1.5 bottles of wine and 1/2 bottle of vodka mixed withj juidce     Social History   Substance and Sexual Activity  Drug Use No    Social History   Socioeconomic History   Marital status: Single    Spouse name: Not on file   Number of children: 2   Years of education: 15.5   Highest education level: Not on file  Occupational History   Occupation: FULL TIME STUDENT    Comment: [employer]  Tobacco Use   Smoking status: Never Smoker   Smokeless tobacco: Never Used  Building services engineer Use: Former   Devices: vaped every day until a week ago  Substance and Sexual Activity  Alcohol use: Not Currently    Comment: 11/29/2019: drank 1.5 bottles of wine and 1/2 bottle of vodka mixed withj juidce   Drug use: No   Sexual activity: Not Currently    Birth control/protection: I.U.D.    Comment: Mirena   Other Topics Concern   Not on file  Social History Narrative   Not on file   Social Determinants of Health   Financial Resource Strain: Low Risk    Difficulty of Paying Living Expenses: Not very hard  Food Insecurity: No Food Insecurity   Worried About Programme researcher, broadcasting/film/video in the Last Year: Never true   Ran Out of Food in the Last Year: Never true  Transportation Needs: No Transportation Needs   Lack of Transportation (Medical): No   Lack of Transportation (Non-Medical): No  Physical Activity: Inactive   Days of Exercise per Week: 0 days   Minutes of Exercise per Session: 0 min   Stress: Stress Concern Present   Feeling of Stress : To some extent  Social Connections: Moderately Isolated   Frequency of Communication with Friends and Family: Three times a week   Frequency of Social Gatherings with Friends and Family: Three times a week   Attends Religious Services: 1 to 4 times per year   Active Member of Clubs or Organizations: No   Attends Banker Meetings: Never   Marital Status: Never married   Additional Social History:    Allergies:   Allergies  Allergen Reactions   Peanuts [Peanut Oil] Anaphylaxis, Hives and Swelling    Tree nuts also    Mushroom Extract Complex     Labs:  Results for orders placed or performed during the hospital encounter of 02/29/20 (from the past 48 hour(s))  Basic metabolic panel     Status: Abnormal   Collection Time: 02/29/20 12:39 PM  Result Value Ref Range   Sodium 140 135 - 145 mmol/L   Potassium 3.2 (L) 3.5 - 5.1 mmol/L   Chloride 103 98 - 111 mmol/L   CO2 26 22 - 32 mmol/L   Glucose, Bld 92 70 - 99 mg/dL    Comment: Glucose reference range applies only to samples taken after fasting for at least 8 hours.   BUN 11 6 - 20 mg/dL   Creatinine, Ser 1.61 0.44 - 1.00 mg/dL   Calcium 9.6 8.9 - 09.6 mg/dL   GFR calc non Af Amer >60 >60 mL/min   GFR calc Af Amer >60 >60 mL/min   Anion gap 11 5 - 15    Comment: Performed at [facility], [address] [place], [place] [address]  CBC     Status: None   Collection Time: 02/29/20 12:39 PM  Result Value Ref Range   WBC 5.1 4.0 - 10.5 K/uL   RBC 4.86 3.87 - 5.11 MIL/uL   Hemoglobin 13.4 12.0 - 15.0 g/dL   HCT 98.1 36 - 46 %   MCV 82.9 80.0 - 100.0 fL   MCH 27.6 26.0 - 34.0 pg   MCHC 33.3 30.0 - 36.0 g/dL   RDW 19.1 47.8 - 29.5 %   Platelets 210 150 - 400 K/uL   nRBC 0.0 0.0 - 0.2 %    Comment: Performed at [facility], [address] [place], [place] [address]  Troponin I (High Sensitivity)     Status: None   Collection  Time: 02/29/20 12:39 PM  Result Value Ref Range   Troponin I (High Sensitivity) <2 <18  ng/L    Comment: (NOTE) Elevated high sensitivity troponin I (hsTnI) values and significant  changes across serial measurements may suggest ACS but many other  chronic and acute conditions are known to elevate hsTnI results.  Refer to the "Links" section for chest pain algorithms and additional  guidance. Performed at [facility], [address] [place], [place] [address]   Ethanol     Status: None   Collection Time: 02/29/20 12:39 PM  Result Value Ref Range   Alcohol, Ethyl (B) <10 <10 mg/dL    Comment: (NOTE) Lowest detectable limit for serum alcohol is 10 mg/dL.  For medical purposes only. Performed at [facility], [address] [place], [place] [address]   Salicylate level     Status: Abnormal   Collection Time: 02/29/20 12:39 PM  Result Value Ref Range   Salicylate Lvl <7.0 (L) 7.0 - 30.0 mg/dL    Comment: Performed at [facility], [address] [place], [place] [address]  Acetaminophen level     Status: Abnormal   Collection Time: 02/29/20 12:39 PM  Result Value Ref Range   Acetaminophen (Tylenol), Serum <10 (L) 10 - 30 ug/mL    Comment: (NOTE) Therapeutic concentrations vary significantly. A range of 10-30 ug/mL  may be an effective concentration for many patients. However, some  are best treated at concentrations outside of this range. Acetaminophen concentrations >150 ug/mL at 4 hours after ingestion  and >50 ug/mL at 12 hours after ingestion are often associated with  toxic reactions.  Performed at [facility], [address] [place], [place] [address]   Urine Drug Screen, Qualitative     Status: None   Collection Time: 02/29/20 12:39 PM  Result Value Ref Range   Tricyclic, Ur Screen NONE DETECTED NONE DETECTED   Amphetamines, Ur Screen NONE DETECTED NONE DETECTED   MDMA (Ecstasy)Ur Screen NONE DETECTED NONE DETECTED    Cocaine Metabolite,Ur [place] NONE DETECTED NONE DETECTED   Opiate, Ur Screen NONE DETECTED NONE DETECTED   Phencyclidine (PCP) Ur S NONE DETECTED NONE DETECTED   Cannabinoid 50 Ng, Ur [place] NONE DETECTED NONE DETECTED   Barbiturates, Ur Screen NONE DETECTED NONE DETECTED   Benzodiazepine, Ur Scrn NONE DETECTED NONE DETECTED   Methadone Scn, Ur NONE DETECTED NONE DETECTED    Comment: (NOTE) Tricyclics + metabolites, urine    Cutoff 1000 ng/mL Amphetamines + metabolites, urine  Cutoff 1000 ng/mL MDMA (Ecstasy), urine              Cutoff 500 ng/mL Cocaine Metabolite, urine          Cutoff 300 ng/mL Opiate + metabolites, urine        Cutoff 300 ng/mL Phencyclidine (PCP), urine         Cutoff 25 ng/mL Cannabinoid, urine                 Cutoff 50 ng/mL Barbiturates + metabolites, urine  Cutoff 200 ng/mL Benzodiazepine, urine              Cutoff 200 ng/mL Methadone, urine                   Cutoff 300 ng/mL  The urine drug screen provides only a preliminary, unconfirmed analytical test result and should not be used for non-medical purposes. Clinical consideration and professional judgment should be applied to any positive drug screen result due to possible interfering substances. A more specific alternate chemical method must be used in order  to obtain a confirmed analytical result. Gas chromatography / mass spectrometry (GC/MS) is the preferred confirm atory method. Performed at [facility], [address] [place], [place] [address]   Pregnancy, urine POC     Status: None   Collection Time: 02/29/20  1:46 PM  Result Value Ref Range   Preg Test, Ur NEGATIVE NEGATIVE    Comment:        THE SENSITIVITY OF THIS METHODOLOGY IS >24 mIU/mL     Current Facility-Administered Medications  Medication Dose Route Frequency Provider Last Rate Last Admin   acetaminophen (TYLENOL) tablet 650 mg  650 mg Oral Q4H PRN Shaune Pollack, MD       alum & mag hydroxide-simeth (MAALOX/MYLANTA)  200-200-20 MG/5ML suspension 30 mL  30 mL Oral Q6H PRN Shaune Pollack, MD       famotidine (PEPCID) tablet 20 mg  20 mg Oral BID Shaune Pollack, MD       ondansetron (ZOFRAN-ODT) disintegrating tablet 4 mg  4 mg Oral Once Darci Current, MD       sodium chloride flush (NS) 0.9 % injection 3 mL  3 mL Intravenous Once Shaune Pollack, MD       sucralfate (CARAFATE) 1 GM/10ML suspension 1 g  1 g Oral QID Shaune Pollack, MD       Current Outpatient Medications  Medication Sig Dispense Refill   amoxicillin (AMOXIL) 875 MG tablet Take 1 tablet (875 mg total) by mouth 2 (two) times daily. 20 tablet 0   EPINEPHrine 0.3 mg/0.3 mL IJ SOAJ injection Inject 0.3 mLs (0.3 mg total) into the muscle as needed for anaphylaxis. 2 each 0   famotidine (PEPCID) 20 MG tablet Take 1 tablet (20 mg total) by mouth 2 (two) times daily. 60 tablet 0   lactulose (CHRONULAC) 10 GM/15ML solution Take 30 mLs (20 g total) by mouth daily as needed for mild constipation. (Patient not taking: Reported on 02/11/2020) 120 mL 0   levonorgestrel (MIRENA) 20 MCG/24HR IUD 1 each by Intrauterine route once.     Multiple Vitamins-Minerals (ONE DAILY MULTIVITAMIN ADULT) TABS Take 1 tablet by mouth daily. (Patient not taking: Reported on 02/11/2020)     sertraline (ZOLOFT) 50 MG tablet Take 1 tablet (50 mg total) by mouth daily. 90 tablet 0   sucralfate (CARAFATE) 1 GM/10ML suspension Take 10 mLs (1 g total) by mouth 4 (four) times daily. 420 mL 1   traMADol (ULTRAM) 50 MG tablet Take 1 tablet (50 mg total) by mouth every 6 (six) hours as needed. 10 tablet 0    Musculoskeletal: Strength & Muscle Tone: within normal limits Gait & Station: normal Patient leans: Brace to right foot removed, could be use the self injured  Psychiatric Specialty Exam: Physical Exam Psychiatric:        Attention and Perception: She is inattentive.        Mood and Affect: Mood is depressed and elated. Affect is flat, angry and inappropriate.         Speech: Speech is rapid and pressured.        Behavior: Behavior is uncooperative, agitated and aggressive.        Thought Content: Thought content includes suicidal ideation. Thought content includes suicidal plan.        Cognition and Memory: Cognition and memory normal.        Judgment: Judgment is impulsive.     Review of Systems  Psychiatric/Behavioral: Positive for agitation, behavioral problems, self-injury, sleep disturbance and suicidal ideas.  The patient is nervous/anxious and is hyperactive.   All other systems reviewed and are negative.   Blood pressure 111/71, pulse 62, temperature 98.7 F (37.1 C), temperature source Oral, resp. rate 17, height 5\' 10"  (1.778 m), weight 58.5 kg, SpO2 100 %, currently breastfeeding.Body mass index is 18.51 kg/m.  General Appearance: Bizarre  Eye Contact:  Fair  Speech:  Clear and Coherent  Volume:  Normal  Mood:  Angry, Depressed, Hopeless and Irritable  Affect:  Congruent, Inappropriate and Full Range  Thought Process:  Coherent and Irrelevant  Orientation:  Full (Time, Place, and Person)  Thought Content:  Illogical, Delusions, Obsessions and Rumination  Suicidal Thoughts:  Yes.  with intent/plan  Homicidal Thoughts:  No  Memory:  Immediate;   Good Recent;   Good Remote;   Good  Judgement:  Impaired  Insight:  Lacking  Psychomotor Activity:  Increased  Concentration:  Concentration: Good and Attention Span: Good  Recall:  Good  Fund of Knowledge:  Good  Language:  Good  Akathisia:  No  Handed:  Right  AIMS (if indicated):     Assets:  Communication Skills Leisure Time Resilience Social Support  ADL's:  Intact  Cognition:  WNL  Sleep:    Insomnia     Treatment Plan Summary: Medication management and Plan Patient meets criteria for psychiatric inpatient admission.  Disposition: Recommend psychiatric Inpatient admission when medically cleared. Supportive therapy provided about ongoing stressors.  Gillermo MurdochJacqueline  Eliyah Mcshea, NP 03/01/2020 2:22 AM

## 2020-03-01 NOTE — ED Notes (Signed)
Patient states she is having Si thought with plan to cut self. Patient contracts for safety. Patient denies HI and AVH. This Clinical research associate told patient she was IVC patient states she is leaving. Writer explained she could not leave. PT got out of bed and started walking out. This Clinical research associate tried to stop patient without placing hands on patient. Patient started running down hallway. This Clinical research associate along with other staff ran with patient and Clinical research associate grabbed door so patient was unable to leave. Patient was carried back to ED by EDP with permission from Patient.

## 2020-03-01 NOTE — Progress Notes (Signed)
D- Patient alert and oriented. Affect/mood is labile . Marland Kitchen Pt denies SI, HI, AVH, and pain.   A- Scheduled medications administered to patient, per MD orders. Support and encouragement provided.  Routine safety checks conducted every 15 minutes.  Patient informed to notify staff with problems or concerns.  R- No adverse drug reactions noted. Patient contracts for safety at this time. Patient compliant with medications and treatment plan. Patient receptive, calm, and cooperative. Patient interacts well with others on the unit.  Patient remains safe at this time.  Torrie Mayers RN

## 2020-03-01 NOTE — BH Assessment (Signed)
Patient can come down at 2pm  Call to give report: [phone]  Patient is to be admitted to [facility] by Dr. Toni Amend.  Attending Physician will be. Dr. Toni Amend.   Patient has been assigned to room 301, by [facility] Charge Nurse Lindie Spruce, RN.   Intake Paper Work has been signed and placed on patient chart.  ER staff is aware of the admission: 1. Rivka Barbara, ER Secretary  2. Fuller Plan, ER MD  3. Nicole Cella Patient's Nurse

## 2020-03-01 NOTE — Consult Note (Signed)
ORTHOPAEDIC CONSULTATION  REQUESTING PHYSICIAN: Clapacs, Jackquline Denmark, MD  Chief Complaint: Right ankle pain  HPI: [name] is a 24 y.o. female who has persistent right ankle pain after injuring her right ankle on 02/19/2020.  Patient was initially evaluated the emergency department here at [facility].  Patient sustained an inversion injury to the right ankle while wearing high heels.  Patient developed significant pain along the top of her right ankle and foot.  She followed up the [facility] clinic and was given an ASO brace.  Patient has had persistent pain with weightbearing activity despite ASO brace.  She has been using crutches for assistance with ambulation.  Patient states that her right ankle pain is not improving over time.  She still has difficulty weightbearing on the right ankle.  X-ray films in the emergency department have demonstrated an avulsion fracture off the dorsal talus.  Past Medical History:  Diagnosis Date  . Anxiety   . Depression   . Gastritis   . Heart murmur   . Sickle cell trait [facility])    Past Surgical History:  Procedure Laterality Date  . NO PAST SURGERIES     Social History   Socioeconomic History  . Marital status: Single    Spouse name: Not on file  . Number of children: 2  . Years of education: 15.5  . Highest education level: Not on file  Occupational History  . Occupation: FULL TIME STUDENT    Comment: [employer]  Tobacco Use  . Smoking status: Never Smoker  . Smokeless tobacco: Never Used  Vaping Use  . Vaping Use: Former  . Devices: vaped every day until a week ago  Substance and Sexual Activity  . Alcohol use: Not Currently    Comment: 11/29/2019: drank 1.5 bottles of wine and 1/2 bottle of vodka mixed withj juidce  . Drug use: No  . Sexual activity: Not Currently    Birth control/protection: I.U.D.    Comment: Mirena   Other Topics Concern  . Not on file  Social History Narrative  . Not on file   Social  Determinants of Health   Financial Resource Strain: Low Risk   . Difficulty of Paying Living Expenses: Not very hard  Food Insecurity: No Food Insecurity  . Worried About Programme researcher, broadcasting/film/video in the Last Year: Never true  . Ran Out of Food in the Last Year: Never true  Transportation Needs: No Transportation Needs  . Lack of Transportation (Medical): No  . Lack of Transportation (Non-Medical): No  Physical Activity: Inactive  . Days of Exercise per Week: 0 days  . Minutes of Exercise per Session: 0 min  Stress: Stress Concern Present  . Feeling of Stress : To some extent  Social Connections: Moderately Isolated  . Frequency of Communication with Friends and Family: Three times a week  . Frequency of Social Gatherings with Friends and Family: Three times a week  . Attends Religious Services: 1 to 4 times per year  . Active Member of Clubs or Organizations: No  . Attends Banker Meetings: Never  . Marital Status: Never married   Family History  Problem Relation Age of Onset  . Hypertension Maternal Grandmother   . Healthy Mother   . Healthy Father    Allergies  Allergen Reactions  . Peanuts [Peanut Oil] Anaphylaxis, Hives and Swelling    Tree nuts also   . Mushroom Extract Complex    Prior to Admission medications   Medication  Sig Start Date End Date Taking? Authorizing Provider  amoxicillin (AMOXIL) 875 MG tablet Take 1 tablet (875 mg total) by mouth 2 (two) times daily. 02/11/20   Chrismon, Jodell Cipro, PA  EPINEPHrine 0.3 mg/0.3 mL IJ SOAJ injection Inject 0.3 mLs (0.3 mg total) into the muscle as needed for anaphylaxis. 02/11/20   Chrismon, Jodell Cipro, PA  famotidine (PEPCID) 20 MG tablet Take 1 tablet (20 mg total) by mouth 2 (two) times daily. 01/28/20   Irean Hong, MD  lactulose (CHRONULAC) 10 GM/15ML solution Take 30 mLs (20 g total) by mouth daily as needed for mild constipation. Patient not taking: Reported on 02/11/2020 01/28/20   Irean Hong, MD  levonorgestrel  [facility]) 20 MCG/24HR IUD 1 each by Intrauterine route once.    [provider]  Multiple Vitamins-Minerals (ONE DAILY MULTIVITAMIN ADULT) TABS Take 1 tablet by mouth daily. Patient not taking: Reported on 02/11/2020    [provider]  sertraline (ZOLOFT) 50 MG tablet Take 1 tablet (50 mg total) by mouth daily. 02/25/20 05/25/20  Trey Sailors, PA-C  sucralfate (CARAFATE) 1 GM/10ML suspension Take 10 mLs (1 g total) by mouth 4 (four) times daily. 01/28/20   Irean Hong, MD  traMADol (ULTRAM) 50 MG tablet Take 1 tablet (50 mg total) by mouth every 6 (six) hours as needed. 02/19/20 02/18/21  Evon Slack, PA-C   DG Chest 2 View  Result Date: 02/29/2020 CLINICAL DATA:  Chest pain.  Seizure. EXAM: CHEST - 2 VIEW COMPARISON:  February 06, 2020 FINDINGS: Lungs are clear. Heart size and pulmonary vascularity are normal. No adenopathy. No pneumothorax. No bone lesions. IMPRESSION: Lungs clear.  Heart size normal.  No evident pneumothorax. Electronically Signed   By: Bretta Bang III M.D.   On: 02/29/2020 15:28   DG Ankle Right Port  Result Date: 03/01/2020 CLINICAL DATA:  Right ankle pain, unsure of injury, seizure today EXAM: PORTABLE RIGHT ANKLE - 2 VIEW COMPARISON:  Radiograph February 19, 2020 FINDINGS: Stable appearance of several crescentic subacute appearing fracture fragments along the anterosuperior talus. No other acute fractures are identified. The ankle mortise is congruent. No sizeable joint effusion. Mid and hindfoot alignment is otherwise normal though incompletely assessed on nondedicated, nonweightbearing radiograph. Normal bone mineralization. No worrisome osseous lesions. IMPRESSION: Stable appearance of several crescentic subacute appearing fracture fragments along the anterosuperior talus. Seen on comparison from June. No new acute osseous abnormalities. Electronically Signed   By: Kreg Shropshire M.D.   On: 03/01/2020 00:04    Positive ROS: All other systems have been  reviewed and were otherwise negative with the exception of those mentioned in the HPI and as above.  Physical Exam: General: Alert, no acute distress  MUSCULOSKELETAL: Right ankle: Patient's skin is intact.  She has resolving ecchymosis over the dorsal foot and ankle.  Her swelling has improved.  Patient's foot and leg compartments are soft and compressible.  She has no calf tenderness or lower leg edema.  Patient can dorsiflex approximate 10 degrees plantar flex approximately 60 degrees and has no obvious instability on drawer testing.  She has intact sensation light touch throughout the right foot.  She has palpable pedal pulses he can flex and extend her toes.  She has no tenderness over the proximal tibia and fibula joint.  Patient has exquisite tenderness over the anterior ankle joint extending onto the anterior talus extending over the lateral foot.  There is no obvious deformity.  Assessment: Right ankle and foot pain  status post inversion injury consistent with ankle sprain versus occult fracture  Plan: Ms. Whiten is having significant difficulty with weightbearing on the right foot and ankle following injury on 02/19/2020.  Her clinical condition is not improving, but is in fact worsening.  Patient still has significant pain in the right ankle with weightbearing.  I am going to order an MRI to evaluate the extent of her ligamentous injury and to evaluate for an occult fracture.  Patient's ASO brace is not providing enough support for her right ankle.  She would benefit from a cam walker boot.  I have placed an order for a boot in Epic.  If the hospital cannot provide one, I will bring her a boot from my office tomorrow.  I will leave final recommendations once the MRI results are available.   Juanell Fairly, MD    03/01/2020 8:48 PM

## 2020-03-01 NOTE — ED Provider Notes (Signed)
Emergency Medicine Observation Re-evaluation Note  Liviah Cake is a 24 y.o. female, seen on rounds today.  Pt initially presented to the ED for complaints of Seizures and Suicidal Currently, the patient is  Doing well. Still reporting some pain over body, some in chest. Reports pyschogenic seizures as well but none since yesterday when initially seen.   Physical Exam  BP 111/71 (BP Location: Right Arm)   Pulse 62   Temp 98.7 F (37.1 C) (Oral)   Resp 17   Ht 5\' 10"  (1.778 m)   Wt 58.5 kg   SpO2 100%   BMI 18.51 kg/m  Physical Exam  Physical Exam General: No apparent distress HEENT: moist mucous membranes CV: RRR Pulm: Normal WOB GI: soft and non tender MSK: no edema or cyanosis Neuro: face symmetric, moving all extremities     ED Course / MDM  EKG:    I have reviewed the labs performed to date as well as medications administered while in observation.  Recent changes in the last 24 hours include pt was IVC after attempted to elope. Given some oral ativan.   Plan  EKG and trop negative and chest pain has been going on for sometime now. Will get tylenol.   Current plan is for psych re-eval  Patient is under full IVC at this time.   , MD 03/01/20 1101
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
## 2020-03-02 MED ORDER — IBUPROFEN 800 MG PO TABS: 800.0000 mg | ORAL_TABLET | Freq: Four times a day (QID) | ORAL | 1 refills | Status: AC | PRN

## 2020-03-02 MED ORDER — SERTRALINE HCL 50 MG PO TABS: 50.0000 mg | ORAL_TABLET | Freq: Every day | ORAL | 1 refills | Status: AC

## 2020-03-02 MED ORDER — TRAZODONE HCL 50 MG PO TABS: 50.0000 mg | ORAL_TABLET | Freq: Every evening | ORAL | 1 refills | Status: AC | PRN

## 2020-03-02 NOTE — BHH Counselor (Signed)
Adult Comprehensive Assessment  Patient ID: Deborha Moseley, female   DOB: [birth year], 24 y.o.   MRN: [id number]  Information Source:    Current Stressors:     Living/Environment/Situation:  Living Arrangements: Children, Parent  Family History:     Childhood History:     Education:     Employment/Work Situation:      Surveyor, quantity Resources:      Alcohol/Substance Abuse:      Social Support System:      Leisure/Recreation:      Strengths/Needs:      Discharge Plan:      Summary/Recommendations:   Summary and Recommendations (to be completed by the evaluator): Pt is scheduled for discharge today. Pt is a 24 yr old female with a diagnosis of (post-traumatic stress disorder. Pt presented to the ED voluntarily due to experiencing a seizure while at school. Pt shared with medical staff history of anxiety, depression and passive suicidal ideation which led to inpatient hospitalization. Pt denies being followed by a mental health provider and request referral for outpatient therapy only. Recommendations for pt include: crisis stabilization, therapeutic milieu, encourage group attendance and participation, medication management for mood stabilization, and development for comprehensive mental wellness plan. CSW assessing for appropriate referrals.  Glendal Cassaday Philip Aspen. 03/02/2020

## 2020-03-02 NOTE — Tx Team (Signed)
Interdisciplinary Treatment and Diagnostic Plan Update  03/02/2020 Time of Session: 9am [name] MRN: [id number]  Principal Diagnosis: PTSD (post-traumatic stress disorder)  Secondary Diagnoses: Principal Problem:   PTSD (post-traumatic stress disorder) Active Problems:   Alcohol abuse   Major depression, recurrent, chronic (HCC)   Fracture of tarsal bone of right foot   Current Medications:  Current Facility-Administered Medications  Medication Dose Route Frequency Provider Last Rate Last Admin  . acetaminophen (TYLENOL) tablet 650 mg  650 mg Oral Q6H PRN Roselind Messier, MD   650 mg at 03/02/20 1300  . ibuprofen (ADVIL) tablet 800 mg  800 mg Oral Q6H PRN Clapacs, Jackquline Denmark, MD   800 mg at 03/02/20 0831  . magnesium hydroxide (MILK OF MAGNESIA) suspension 30 mL  30 mL Oral Daily PRN Roselind Messier, MD      . sertraline (ZOLOFT) tablet 50 mg  50 mg Oral Daily Clapacs, Jackquline Denmark, MD   50 mg at 03/02/20 0831  . traZODone (DESYREL) tablet 50 mg  50 mg Oral QHS PRN Roselind Messier, MD   50 mg at 03/01/20 2143   PTA Medications: Medications Prior to Admission  Medication Sig Dispense Refill Last Dose  . amoxicillin (AMOXIL) 875 MG tablet Take 1 tablet (875 mg total) by mouth 2 (two) times daily. 20 tablet 0   . EPINEPHrine 0.3 mg/0.3 mL IJ SOAJ injection Inject 0.3 mLs (0.3 mg total) into the muscle as needed for anaphylaxis. 2 each 0   . famotidine (PEPCID) 20 MG tablet Take 1 tablet (20 mg total) by mouth 2 (two) times daily. 60 tablet 0   . lactulose (CHRONULAC) 10 GM/15ML solution Take 30 mLs (20 g total) by mouth daily as needed for mild constipation. (Patient not taking: Reported on 02/11/2020) 120 mL 0   . levonorgestrel (MIRENA) 20 MCG/24HR IUD 1 each by Intrauterine route once.     . Multiple Vitamins-Minerals (ONE DAILY MULTIVITAMIN ADULT) TABS Take 1 tablet by mouth daily. (Patient not taking: Reported on 02/11/2020)     . sucralfate (CARAFATE) 1 GM/10ML suspension Take 10  mLs (1 g total) by mouth 4 (four) times daily. 420 mL 1   . traMADol (ULTRAM) 50 MG tablet Take 1 tablet (50 mg total) by mouth every 6 (six) hours as needed. 10 tablet 0   . [DISCONTINUED] sertraline (ZOLOFT) 50 MG tablet Take 1 tablet (50 mg total) by mouth daily. 90 tablet 0     Patient Stressors:    Patient Strengths:    Treatment Modalities: Medication Management, Group therapy, Case management,  1 to 1 session with clinician, Psychoeducation, Recreational therapy.   Physician Treatment Plan for Primary Diagnosis: PTSD (post-traumatic stress disorder) Long Term Goal(s): Improvement in symptoms so as ready for discharge Improvement in symptoms so as ready for discharge   Short Term Goals: Ability to verbalize feelings will improve Ability to disclose and discuss suicidal ideas Ability to demonstrate self-control will improve Ability to identify and develop effective coping behaviors will improve Ability to maintain clinical measurements within normal limits will improve Compliance with prescribed medications will improve  Medication Management: Evaluate patient's response, side effects, and tolerance of medication regimen.  Therapeutic Interventions: 1 to 1 sessions, Unit Group sessions and Medication administration.  Evaluation of Outcomes: Adequate for Discharge  Physician Treatment Plan for Secondary Diagnosis: Principal Problem:   PTSD (post-traumatic stress disorder) Active Problems:   Alcohol abuse   Major depression, recurrent, chronic (HCC)   Fracture of tarsal bone of right foot  Long Term Goal(s): Improvement in symptoms so as ready for discharge Improvement in symptoms so as ready for discharge   Short Term Goals: Ability to verbalize feelings will improve Ability to disclose and discuss suicidal ideas Ability to demonstrate self-control will improve Ability to identify and develop effective coping behaviors will improve Ability to maintain clinical  measurements within normal limits will improve Compliance with prescribed medications will improve     Medication Management: Evaluate patient's response, side effects, and tolerance of medication regimen.  Therapeutic Interventions: 1 to 1 sessions, Unit Group sessions and Medication administration.  Evaluation of Outcomes: Adequate for Discharge   RN Treatment Plan for Primary Diagnosis: PTSD (post-traumatic stress disorder) Long Term Goal(s): Knowledge of disease and therapeutic regimen to maintain health will improve  Short Term Goals: Ability to identify and develop effective coping behaviors will improve  Medication Management: RN will administer medications as ordered by provider, will assess and evaluate patient's response and provide education to patient for prescribed medication. RN will report any adverse and/or side effects to prescribing provider.  Therapeutic Interventions: 1 on 1 counseling sessions, Psychoeducation, Medication administration, Evaluate responses to treatment, Monitor vital signs and CBGs as ordered, Perform/monitor CIWA, COWS, AIMS and Fall Risk screenings as ordered, Perform wound care treatments as ordered.  Evaluation of Outcomes: Adequate for Discharge   LCSW Treatment Plan for Primary Diagnosis: PTSD (post-traumatic stress disorder) Long Term Goal(s): Safe transition to appropriate next level of care at discharge, Engage patient in therapeutic group addressing interpersonal concerns.  Short Term Goals: Engage patient in aftercare planning with referrals and resources  Therapeutic Interventions: Assess for all discharge needs, 1 to 1 time with Social worker, Explore available resources and support systems, Assess for adequacy in community support network, Educate family and significant other(s) on suicide prevention, Complete Psychosocial Assessment, Interpersonal group therapy.  Evaluation of Outcomes: Adequate for Discharge   Progress in  Treatment: Attending groups: Yes. Participating in groups: Yes. Taking medication as prescribed: Yes. Toleration medication: Yes. Family/Significant other contact made: Yes, individual(s) contacted:  pt declined Patient understands diagnosis: Yes. Discussing patient identified problems/goals with staff: Yes. Medical problems stabilized or resolved: Yes. Denies suicidal/homicidal ideation: Yes. Issues/concerns per patient self-inventory: No. Other: NA  New problem(s) identified: No, Describe:  None reported  New Short Term/Long Term Goal(s):Attend outpatient treatment, take medication as prescribed, develop and implement healthy coping methods  Patient Goals: "To go home to my kids"    Discharge Plan or Barriers: Pt is scheduled for discharge today. Pt will return home and follow up with outpatient treatment.  Reason for Continuation of Hospitalization: Adequate for discharge on 03/02/20  Estimated Length of Stay:N/A  Attendees: Patient:[name] 03/02/2020 1:49 PM  Physician: Mordecai Rasmussen 03/02/2020 1:49 PM  Nursing: Maryelizabeth Kaufmann Manuaritti 03/02/2020 1:49 PM  RN Care Manager: 03/02/2020 1:49 PM  Social Worker: Ferd Hibbs 03/02/2020 1:49 PM  Recreational Therapist: Danella Deis Outlaw 03/02/2020 1:49 PM  Other:  03/02/2020 1:49 PM  Other:  03/02/2020 1:49 PM  Other: 03/02/2020 1:49 PM    Scribe for Treatment Team: Suzan Slick, LCSW 03/02/2020 1:49 PM

## 2020-03-02 NOTE — Discharge Summary (Signed)
Physician Discharge Summary Note  Patient:  [name] is an 24 y.o., female MRN:  [id number] DOB:  [birth year] Patient phone:  [phone] (home)  Patient address:   [address] [facility] [place] [place] [phone],  Total Time spent with patient: 30 minutes  Date of Admission:  03/01/2020 Date of Discharge: 03/02/2020  Reason for Admission: Admitted through the emergency room for acute anxiety and depressive symptoms with suicidal ideation expressed  Principal Problem: PTSD (post-traumatic stress disorder) Discharge Diagnoses: Principal Problem:   PTSD (post-traumatic stress disorder) Active Problems:   Alcohol abuse   Major depression, recurrent, chronic (HCC)   Fracture of tarsal bone of right foot   Past Psychiatric History: Past history of suicide attempts anxiety depression and symptoms most consistent with PTSD  Past Medical History:  Past Medical History:  Diagnosis Date  . Anxiety   . Depression   . Gastritis   . Heart murmur   . Sickle cell trait [facility](HCC)     Past Surgical History:  Procedure Laterality Date  . NO PAST SURGERIES     Family History:  Family History  Problem Relation Age of Onset  . Hypertension Maternal Grandmother   . Healthy Mother   . Healthy Father    Family Psychiatric  History: None reported Social History:  Social History   Substance and Sexual Activity  Alcohol Use Not Currently   Comment: 11/29/2019: drank 1.5 bottles of wine and 1/2 bottle of vodka mixed withj juidce     Social History   Substance and Sexual Activity  Drug Use No    Social History   Socioeconomic History  . Marital status: Single    Spouse name: Not on file  . Number of children: 2  . Years of education: 15.5  . Highest education level: Not on file  Occupational History  . Occupation: FULL TIME STUDENT    Comment: [employer]  Tobacco Use  . Smoking status: Never Smoker  . Smokeless tobacco: Never Used  Vaping Use  . Vaping Use: Former  .  Devices: vaped every day until a week ago  Substance and Sexual Activity  . Alcohol use: Not Currently    Comment: 11/29/2019: drank 1.5 bottles of wine and 1/2 bottle of vodka mixed withj juidce  . Drug use: No  . Sexual activity: Not Currently    Birth control/protection: I.U.D.    Comment: Mirena   Other Topics Concern  . Not on file  Social History Narrative  . Not on file   Social Determinants of Health   Financial Resource Strain: Low Risk   . Difficulty of Paying Living Expenses: Not very hard  Food Insecurity: No Food Insecurity  . Worried About Programme researcher, broadcasting/film/videounning Out of Food in the Last Year: Never true  . Ran Out of Food in the Last Year: Never true  Transportation Needs: No Transportation Needs  . Lack of Transportation (Medical): No  . Lack of Transportation (Non-Medical): No  Physical Activity: Inactive  . Days of Exercise per Week: 0 days  . Minutes of Exercise per Session: 0 min  Stress: Stress Concern Present  . Feeling of Stress : To some extent  Social Connections: Moderately Isolated  . Frequency of Communication with Friends and Family: Three times a week  . Frequency of Social Gatherings with Friends and Family: Three times a week  . Attends Religious Services: 1 to 4 times per year  . Active Member of Clubs or Organizations: No  . Attends BankerClub or Organization  Meetings: Never  . Marital Status: Never married    Hospital Course: Patient admitted to psychiatric unit.  15-minute checks maintained.  Patient showed no dangerous behavior on the unit.  She was cooperative with assessment and treatment.  She was continued on introductory dose of Zoloft as well as as needed trazodone for psychiatric symptoms.  Patient denied suicidal ideation.  She was forthcoming with history which suggests likely PTSD.  Patient has received counseling about the utility of therapy and possibly medication management for chronic anxiety and depression and the dangers of suicidal ideation.  She is  agreeable to referral for outpatient treatment.  She was seen by orthopedic surgery because of ongoing pain and difficulty walking on her right ankle and foot.  MRI was done and interpreted by orthopedics who will be providing a boot.  Recommend follow-up for her per their suggestions.  Physical Findings: AIMS:  , ,  ,  ,    CIWA:    COWS:     Musculoskeletal: Strength & Muscle Tone: within normal limits Gait & Station: normal Patient leans: N/A  Psychiatric Specialty Exam: Physical Exam Vitals and nursing note reviewed.  Constitutional:      Appearance: She is well-developed.  HENT:     Head: Normocephalic and atraumatic.  Eyes:     Conjunctiva/sclera: Conjunctivae normal.     Pupils: Pupils are equal, round, and reactive to light.  Cardiovascular:     Heart sounds: Normal heart sounds.  Pulmonary:     Effort: Pulmonary effort is normal.  Abdominal:     Palpations: Abdomen is soft.  Musculoskeletal:        General: Tenderness and signs of injury present. Normal range of motion.     Cervical back: Normal range of motion.  Skin:    General: Skin is warm and dry.  Neurological:     General: No focal deficit present.     Mental Status: She is alert.  Psychiatric:        Attention and Perception: Attention normal.        Mood and Affect: Mood normal.        Speech: Speech normal.        Behavior: Behavior normal.        Thought Content: Thought content normal.        Cognition and Memory: Cognition normal.        Judgment: Judgment normal.     Review of Systems  Constitutional: Negative.   HENT: Negative.   Eyes: Negative.   Respiratory: Negative.   Cardiovascular: Negative.   Gastrointestinal: Negative.   Musculoskeletal: Negative.        Continued right foot and ankle pain  Skin: Negative.   Neurological: Negative.     Blood pressure 100/89, pulse 71, temperature 98.1 F (36.7 C), temperature source Oral, resp. rate 17, height 5\' 10"  (1.778 m), weight 56.7 kg,  SpO2 100 %, currently breastfeeding.Body mass index is 17.94 kg/m.  General Appearance: Casual  Eye Contact:  Good  Speech:  Clear and Coherent  Volume:  Normal  Mood:  Euthymic  Affect:  Congruent  Thought Process:  Coherent  Orientation:  Full (Time, Place, and Person)  Thought Content:  Logical  Suicidal Thoughts:  No  Homicidal Thoughts:  No  Memory:  Immediate;   Fair Recent;   Fair Remote;   Fair  Judgement:  Fair  Insight:  Fair  Psychomotor Activity:  Normal  Concentration:  Concentration: Fair  Recall:  Fair  Fund of Knowledge:  Fair  Language:  Fair  Akathisia:  No  Handed:  Right  AIMS (if indicated):     Assets:  Desire for Improvement Housing Resilience Social Support  ADL's:  Intact  Cognition:  WNL  Sleep:  Number of Hours: 7.25     Have you used any form of tobacco in the last 30 days? (Cigarettes, Smokeless Tobacco, Cigars, and/or Pipes): No  Has this patient used any form of tobacco in the last 30 days? (Cigarettes, Smokeless Tobacco, Cigars, and/or Pipes) Yes, No  Blood Alcohol level:  Lab Results  Component Value Date   ETH <10 02/29/2020   ETH 21 (H) 11/29/2019    Metabolic Disorder Labs:  Lab Results  Component Value Date   HGBA1C 5.3 12/01/2019   MPG 105 12/01/2019   No results found for: PROLACTIN Lab Results  Component Value Date   CHOL 117 12/01/2019   TRIG 45 12/01/2019   HDL 55 12/01/2019   CHOLHDL 2.1 12/01/2019   VLDL 9 12/01/2019   LDLCALC 53 12/01/2019   LDLCALC 49 11/12/2019    See Psychiatric Specialty Exam and Suicide Risk Assessment completed by Attending Physician prior to discharge.  Discharge destination:  Home  Is patient on multiple antipsychotic therapies at discharge:  No   Has Patient had three or more failed trials of antipsychotic monotherapy by history:  No  Recommended Plan for Multiple Antipsychotic Therapies: NA  Discharge Instructions    Diet - low sodium heart healthy   Complete by: As  directed    Increase activity slowly   Complete by: As directed      Allergies as of 03/02/2020      Reactions   Peanuts [peanut Oil] Anaphylaxis, Hives, Swelling   Tree nuts also    Mushroom Extract Complex       Medication List    STOP taking these medications   amoxicillin 875 MG tablet Commonly known as: AMOXIL   EPINEPHrine 0.3 mg/0.3 mL Soaj injection Commonly known as: EPI-PEN   traMADol 50 MG tablet Commonly known as: Ultram     TAKE these medications     Indication  famotidine 20 MG tablet Commonly known as: Pepcid Take 1 tablet (20 mg total) by mouth 2 (two) times daily.  Indication: Gastroesophageal Reflux Disease   ibuprofen 800 MG tablet Commonly known as: ADVIL Take 1 tablet (800 mg total) by mouth every 6 (six) hours as needed for moderate pain.  Indication: Pain   lactulose 10 GM/15ML solution Commonly known as: CHRONULAC Take 30 mLs (20 g total) by mouth daily as needed for mild constipation.  Indication: Constipation   levonorgestrel 20 MCG/24HR IUD Commonly known as: MIRENA 1 each by Intrauterine route once.  Indication: Birth Control Treatment   One Daily Multivitamin Adult Tabs Take 1 tablet by mouth daily.  Indication: Vitamin supplement   sertraline 50 MG tablet Commonly known as: ZOLOFT Take 1 tablet (50 mg total) by mouth daily.  Indication: Major Depressive Disorder, Posttraumatic Stress Disorder   sucralfate 1 GM/10ML suspension Commonly known as: Carafate Take 10 mLs (1 g total) by mouth 4 (four) times daily.  Indication: Stomach Ulcer   traZODone 50 MG tablet Commonly known as: DESYREL Take 1 tablet (50 mg total) by mouth at bedtime as needed for sleep.  Indication: Trouble Sleeping        Follow-up recommendations:  Activity:  Activity as tolerated Diet:  Regular diet Other:  Follow-up with outpatient recommended mental health treatment  Comments: Prescriptions completed at discharge  Signed: Mordecai Rasmussen,  MD 03/02/2020, 10:18 AM

## 2020-03-02 NOTE — Progress Notes (Signed)
D: Pt alert and oriented x 4. Pt denies experiencing any pain, SI/HI, or AVH at this time. Pt reports she will be able to keep herself safe when she returns home. Pt was given a survey to fill out.  A: Pt received discharge and medication education/information. Pt belongings were returned and confirmed all present upon discharge.   R: Pt verbalized understanding of discharge and medication education/information.  Pt escorted to medical mall front lobby where pt's family picked her up.

## 2020-03-02 NOTE — BHH Counselor (Signed)
Per pt request, CSW team provided her with a list of AA support groups and community mental health providers. Pt reports her PCP suggest she participate in substance use counseling.

## 2020-03-02 NOTE — Progress Notes (Signed)
Subjective:  Patient still has pain in the right ankle.  Patient is using a wheelchair to get around on the behavioral unit.  Objective:   VITALS:   Vitals:   03/01/20 1600 03/02/20 0634  BP: 114/69 100/89  Pulse: 74 71  Resp: 18 17  Temp: 98.5 F (36.9 C) 98.1 F (36.7 C)  TempSrc: Oral Oral  SpO2: 100% 100%  Weight: 56.7 kg   Height: 5\' 10"  (1.778 m)     PHYSICAL EXAM: Right lower extremity Neurovascular intact Sensation intact distally Intact pulses distally Dorsiflexion/Plantar flexion intact No cellulitis present Compartment soft  LABS  No results found for this or any previous visit (from the past 24 hour(s)).  DG Chest 2 View  Result Date: 02/29/2020 CLINICAL DATA:  Chest pain.  Seizure. EXAM: CHEST - 2 VIEW COMPARISON:  February 06, 2020 FINDINGS: Lungs are clear. Heart size and pulmonary vascularity are normal. No adenopathy. No pneumothorax. No bone lesions. IMPRESSION: Lungs clear.  Heart size normal.  No evident pneumothorax. Electronically Signed   By: February 08, 2020 III M.D.   On: 02/29/2020 15:28   MR ANKLE RIGHT WO CONTRAST  Result Date: 03/02/2020 CLINICAL DATA:  Inversion injury of the right ankle with persistent pain. EXAM: MRI OF THE RIGHT ANKLE WITHOUT CONTRAST TECHNIQUE: Multiplanar, multisequence MR imaging of the ankle was performed. No intravenous contrast was administered. COMPARISON:  02/29/2020 FINDINGS: TENDONS Peroneal: Peroneus brevis tenosynovitis with subcutaneous edema overlying the peroneus tendons just past the level of the peroneal tubercle. Posteromedial: Mild distal tibialis posterior tendinopathy. Correlate clinically in assessing for tibialis posterior dysfunction. Anterior: Unremarkable Achilles: Unremarkable Plantar Fascia: Unremarkable LIGAMENTS Lateral: Borderline thickened calcaneofibular ligament. Edema surrounds the anterior talofibular ligament which is slightly thinned along the talar attachment, potentially reflecting a sprain  or mild partial tear, although the ATFL is not discontinuous. No regional synovitis. Medial: Unremarkable CARTILAGE Ankle Joint: Small tibiotalar joint effusion. Subtalar Joints/Sinus Tarsi: Marrow edema in the talar neck and talar head medially, extending along the talar side of the medial subtalar facet. Bones: Marrow edema distally in the talus, especially medially. Suspected avulsion of the talar attachment of the dorsal talofibular ligament. Bone contusion of medial talar head not excluded. There is marrow edema along the plantar-medial cuboid as on image 26/5, potentially associated with injury or sprain of the plantar calcaneocuboid ligament (short plantar ligament). Other: Subcutaneous edema noted in the ankle especially laterally below the level of the lateral malleolus. There is also some infiltrative edema in Kager's fat pad. IMPRESSION: 1. Midtarsal sprain, with suspected avulsion of the talar margin of the dorsal talonavicular ligament, notable marrow edema in the talar head and neck especially medially, and suspected sprain of the plantar calcaneocuboid ligament with associated edema in the plantar-medial cuboid. 2. Small tibiotalar joint effusion. 3. Peroneus brevis tenosynovitis with subcutaneous edema overlying the peroneus tendons just past the level of the peroneal tubercle. 4. Mild distal tibialis posterior tendinopathy. Correlate clinically in assessing for tibialis posterior dysfunction. 5. Borderline thickened calcaneofibular ligament, possibly reflecting a sprain or mild partial tear, although the ATFL is not discontinuous. Electronically Signed   By: 05/01/2020 M.D.   On: 03/02/2020 06:05   DG Ankle Right Port  Result Date: 03/01/2020 CLINICAL DATA:  Right ankle pain, unsure of injury, seizure today EXAM: PORTABLE RIGHT ANKLE - 2 VIEW COMPARISON:  Radiograph February 19, 2020 FINDINGS: Stable appearance of several crescentic subacute appearing fracture fragments along the  anterosuperior talus. No other acute fractures are identified. The  ankle mortise is congruent. No sizeable joint effusion. Mid and hindfoot alignment is otherwise normal though incompletely assessed on nondedicated, nonweightbearing radiograph. Normal bone mineralization. No worrisome osseous lesions. IMPRESSION: Stable appearance of several crescentic subacute appearing fracture fragments along the anterosuperior talus. Seen on comparison from June. No new acute osseous abnormalities. Electronically Signed   By: Kreg Shropshire M.D.   On: 03/01/2020 00:04    Assessment/Plan:     Principal Problem:   PTSD (post-traumatic stress disorder) Active Problems:   Alcohol abuse   Major depression, recurrent, chronic (HCC)   Fracture of tarsal bone of right foot  I reviewed the patient has a right ankle MRI.  Patient has a midfoot sprain as well as a lateral ankle sprain.  Patient has an avulsion fracture to the talus which will not require surgical intervention.  Patient was placed in a walking boot to replace her ASO brace.  She will keep the brace for use later on as she improves.  Patient is weightbearing as tolerated in the boot but should continue to use her crutches for assistance with ambulation.  Patient will follow-up in my office in 1 to 2 weeks for reevaluation.  I reviewed the MRI findings with the patient and she is in agreement with the above-noted plan.    Juanell Fairly , MD 03/02/2020, 11:00 AM

## 2020-03-02 NOTE — Progress Notes (Signed)
Recreation Therapy Notes     Date: 03/02/2020  Time: 9:30 am   Location: Craft room  Behavioral response: N/A   Intervention Topic: Coping Skills     Discussion/Intervention: Patient did not attend group.   Clinical Observations/Feedback:  Patient did not attend group.   [name] LRT/CTRS        [name] 03/02/2020 11:41 AM 

## 2020-03-02 NOTE — Progress Notes (Signed)
°  [facility] Adult Case Management Discharge Plan :  Will you be returning to the same living situation after discharge:  Yes,  pt reports she is returning home At discharge, do you have transportation home?: Yes,  pt reports her mother will provide transportation. Do you have the ability to pay for your medications: Yes,  [employer]  Release of information consent forms completed and in the chart;  Patient's signature needed at discharge.  Patient to Follow up at:  Follow-up Information    [facility] Follow up.   Why: Please complete the new patient packet that has been sent to your email address. It will take 2-3 to verify your insurance. You will receive a welcome email and can schedule appointment in the patient portal or call the office. Thank you. Contact information: [address]  [place], [place] [address] [phone] Fax:[phone]       [facility] Follow up.   Why: Your appointment is scheduled for 03/08/2020 at 3:00pm.  Thanks!  Contact information: [address] [place], [place], [address] Phone: [phone] Fax: [phone]              Next level of care provider has access to [facility] Link:no  Safety Planning and Suicide Prevention discussed: Yes,  SPE completed with pt.  Have you used any form of tobacco in the last 30 days? (Cigarettes, Smokeless Tobacco, Cigars, and/or Pipes): No  Has patient been referred to the Quitline?: Patient refused referral  Patient has been referred for addiction treatment: Pt. refused referral  Harden Mo, LCSW 03/02/2020, 12:20 PM

## 2020-03-02 NOTE — Plan of Care (Signed)
D: Pt alert and oriented x 4. Pt denies experiencing any anxiety/depression at this time. Pt reports experiencing 8/10 pain in Right Foot, prn meds given. Pt denies experiencing any SI/HI, or AVH at this time.   A: Scheduled medications administered to pt, per MD orders. Support and encouragement provided. Frequent verbal contact made. Routine safety checks conducted q15 minutes.   R: No adverse drug reactions noted. Pt verbally contracts for safety at this time. Pt complaint with medications and treatment plan. Pt interacts well with others on the unit. Pt remains safe at this time. Will continue to monitor.   Problem: Education: Goal: Knowledge of [facility] Education information/materials will improve Outcome: Progressing Goal: Emotional status will improve Outcome: Progressing Goal: Mental status will improve Outcome: Progressing Goal: Verbalization of understanding the information provided will improve Outcome: Progressing   Problem: Activity: Goal: Sleeping patterns will improve Outcome: Progressing

## 2020-03-02 NOTE — [facility] Group Notes (Signed)
LCSW Group Therapy Note  03/02/2020 2:47 PM  Type of Therapy/Topic:  Group Therapy:  Balance in Life  Participation Level:  Active  Description of Group:    This group will address the concept of balance and how it feels and looks when one is unbalanced. Patients will be encouraged to process areas in their lives that are out of balance and identify reasons for remaining unbalanced. Facilitators will guide patients in utilizing problem-solving interventions to address and correct the stressor making their life unbalanced. Understanding and applying boundaries will be explored and addressed for obtaining and maintaining a balanced life. Patients will be encouraged to explore ways to assertively make their unbalanced needs known to significant others in their lives, using other group members and facilitator for support and feedback.  Therapeutic Goals: 1. Patient will identify two or more emotions or situations they have that consume much of in their lives. 2. Patient will identify signs/triggers that life has become out of balance:  3. Patient will identify two ways to set boundaries in order to achieve balance in their lives:  4. Patient will demonstrate ability to communicate their needs through discussion and/or role plays  Summary of Patient Progress: Patient was present in group.  Patient was supportive of group members.  Patient shared that she has struggled with her anger and walking away from others.  Patient reports that she has had to practice coping skills to deescalate from fighting others.    Therapeutic Modalities:   Cognitive Behavioral Therapy Solution-Focused Therapy Assertiveness Training  Penni Homans MSW, [place] 03/02/2020 2:47 PM

## 2020-03-02 NOTE — [facility] Suicide Risk Assessment (Signed)
[facility] Discharge Suicide Risk Assessment   Principal Problem: PTSD (post-traumatic stress disorder) Discharge Diagnoses: Principal Problem:   PTSD (post-traumatic stress disorder) Active Problems:   Alcohol abuse   Major depression, recurrent, chronic (HCC)   Fracture of tarsal bone of right foot   Total Time spent with patient: 30 minutes  Musculoskeletal: Strength & Muscle Tone: within normal limits Gait & Station: normal Patient leans: N/A  Psychiatric Specialty Exam: Review of Systems  Constitutional: Negative.   HENT: Negative.   Eyes: Negative.   Respiratory: Negative.   Cardiovascular: Negative.   Gastrointestinal: Negative.   Musculoskeletal: Negative.        Continues to have pain and difficulty with weightbearing on the right foot  Skin: Negative.   Neurological: Negative.   Psychiatric/Behavioral: Negative.     Blood pressure 100/89, pulse 71, temperature 98.1 F (36.7 C), temperature source Oral, resp. rate 17, height 5\' 10"  (1.778 m), weight 56.7 kg, SpO2 100 %, currently breastfeeding.Body mass index is 17.94 kg/m.  General Appearance: Casual  Eye Contact::  Good  Speech:  Normal Rate409  Volume:  Normal  Mood:  Euthymic  Affect:  Constricted  Thought Process:  Coherent  Orientation:  Full (Time, Place, and Person)  Thought Content:  Logical  Suicidal Thoughts:  No  Homicidal Thoughts:  No  Memory:  Immediate;   Fair Recent;   Fair Remote;   Fair  Judgement:  Fair  Insight:  Fair  Psychomotor Activity:  Normal  Concentration:  Fair  Recall:  [phone] of Knowledge:Fair  Language: Fair  Akathisia:  No  Handed:  Right  AIMS (if indicated):     Assets:  Desire for Improvement Housing Resilience Social Support  Sleep:  Number of Hours: 7.25  Cognition: WNL  ADL's:  Intact   Mental Status Per Nursing Assessment::   On Admission:  NA  Demographic Factors:  Adolescent or young adult  Loss Factors: Decline in physical health  Historical  Factors: Prior suicide attempts and Impulsivity  Risk Reduction Factors:   Responsible for children under 69 years of age, Sense of responsibility to family, Living with another person, especially a relative and Positive social support  Continued Clinical Symptoms:  Depression:   Anhedonia  Cognitive Features That Contribute To Risk:  None    Suicide Risk:  Minimal: No identifiable suicidal ideation.  Patients presenting with no risk factors but with morbid ruminations; may be classified as minimal risk based on the severity of the depressive symptoms    Plan Of Care/Follow-up recommendations:  Activity:  Activity as tolerated Diet:  Regular diet Other:  Strongly encourage follow-up with [facility] or other local providers for therapy and management of chronic anxiety and depression  15, MD 03/02/2020, 10:13 AM

## 2020-04-03 ENCOUNTER — Telehealth: Payer: PRIVATE HEALTH INSURANCE | Admitting: Psychiatry

## 2020-04-03 ENCOUNTER — Other Ambulatory Visit: Payer: Self-pay

## 2020-04-17 ENCOUNTER — Telehealth: Payer: Self-pay | Admitting: *Deleted

## 2020-04-17 NOTE — Telephone Encounter (Signed)
Called and left a voicemail asking for patient to return call in regards to [employer] vaccine that she received on 02/11/20 to see how she is doing.

## 2021-01-02 IMAGING — CT CT ABD-PELV W/ CM
2 of 4 series · 16 of 46 positions shown, 18 images · IV contrast (APPLIED)
Comparison: None.

CLINICAL DATA: Left-sided abdominal pain

EXAM:
CT ABDOMEN AND PELVIS WITH CONTRAST
TECHNIQUE: Multidetector CT imaging of the abdomen and pelvis was performed
using the standard protocol following bolus administration of
intravenous contrast.
CONTRAST:  100mL OMNIPAQUE IOHEXOL 300 MG/ML  SOLN

[Series 2: routine abd/pel with · axial · 0.60mm/px · z∈[-692,-292]mm · 13 of 88 slices shown, 15 images]
[im 4/88  soft-tissue]
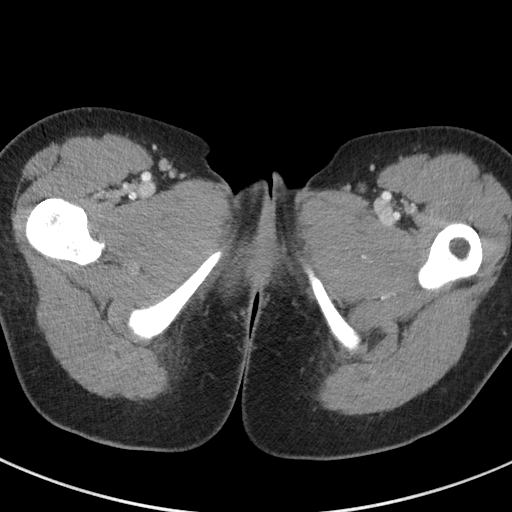
[im 4/88  bone]
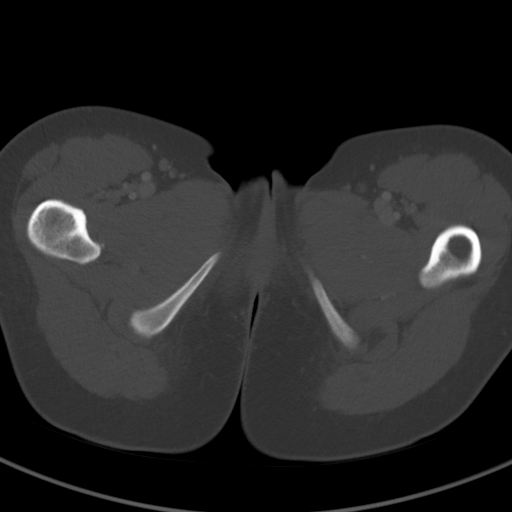
[im 11/88  soft-tissue]
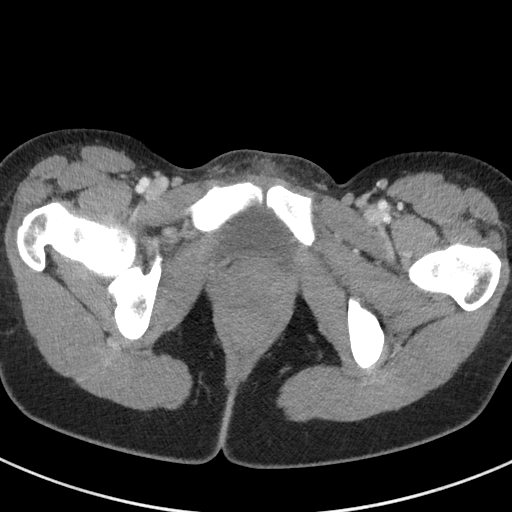
[im 19/88  soft-tissue]
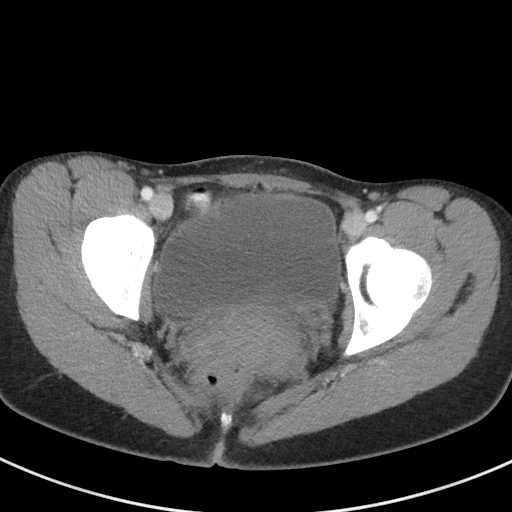
[im 26/88  soft-tissue]
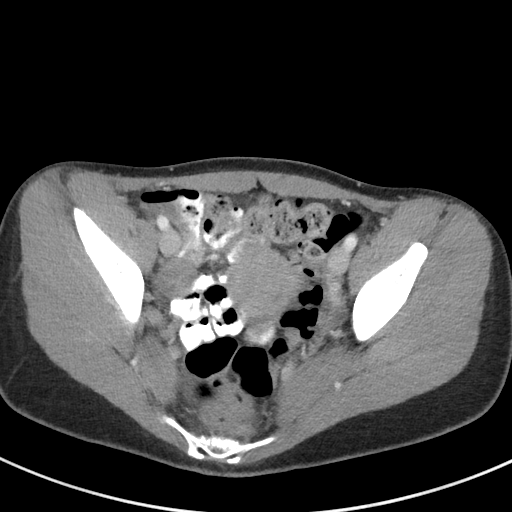
[im 30/88  soft-tissue]
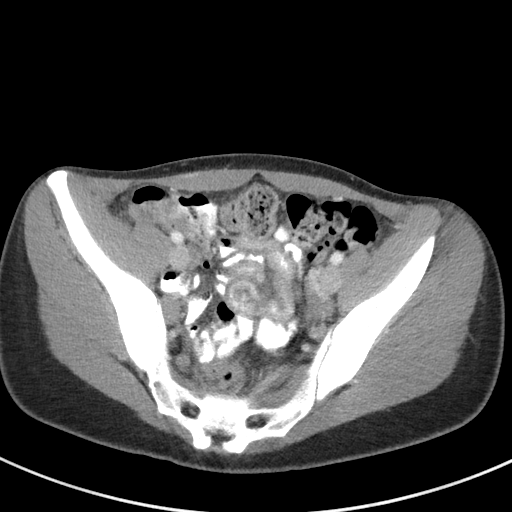
[im 37/88  soft-tissue]
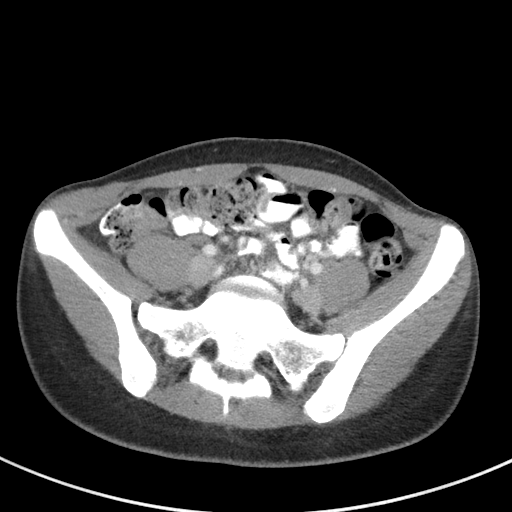
[im 44/88  soft-tissue]
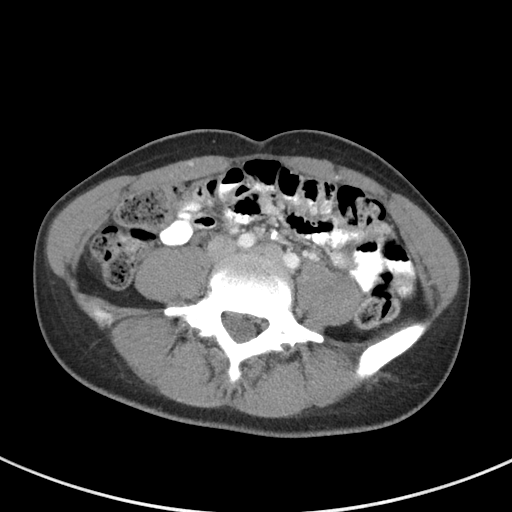
[im 51/88  soft-tissue]
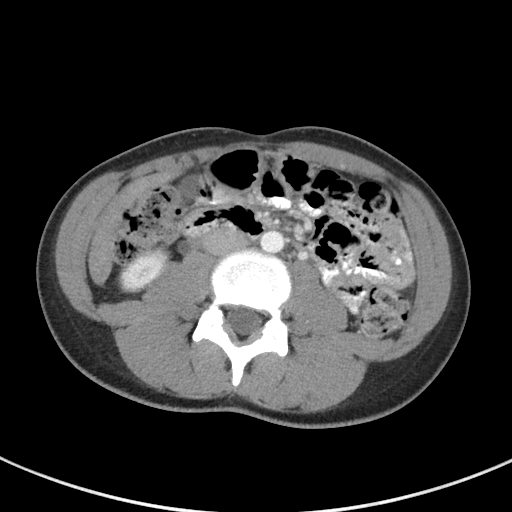
[im 59/88  soft-tissue]
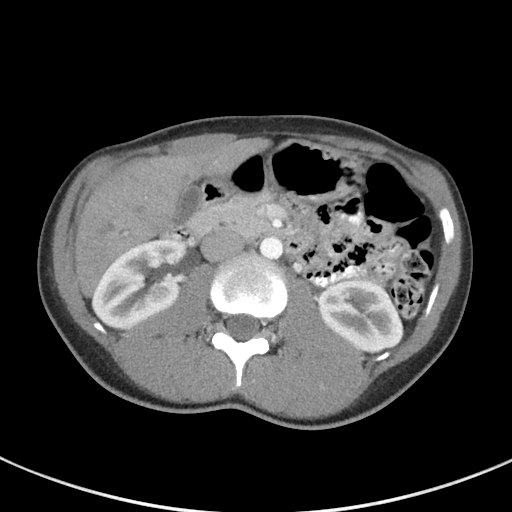
[im 59/88  bone]
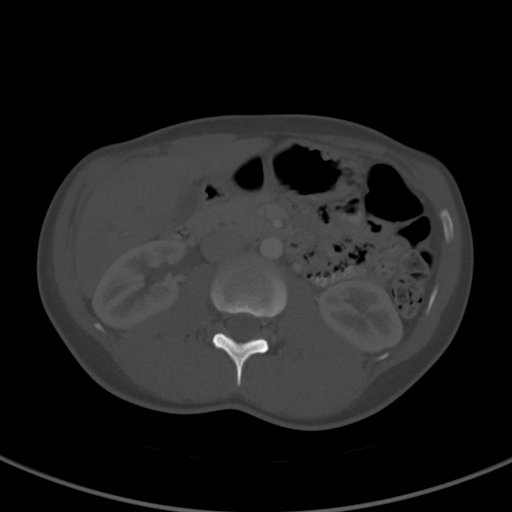
[im 62/88  soft-tissue]
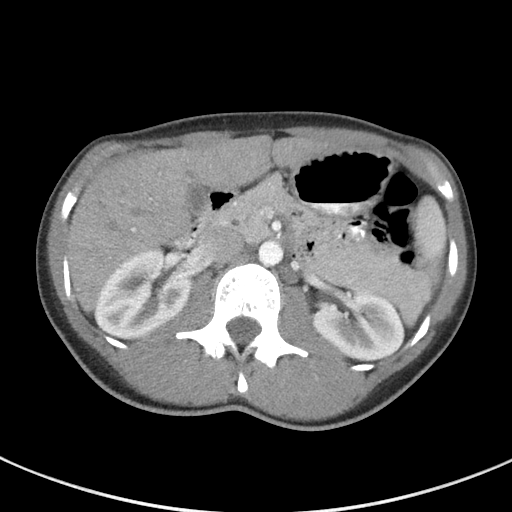
[im 69/88  soft-tissue]
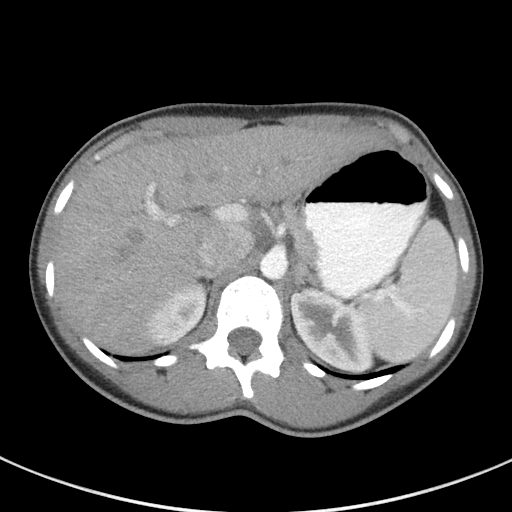
[im 77/88  soft-tissue]
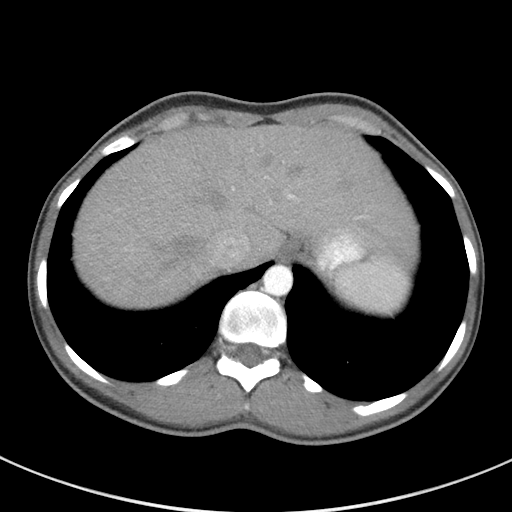
[im 84/88  soft-tissue]
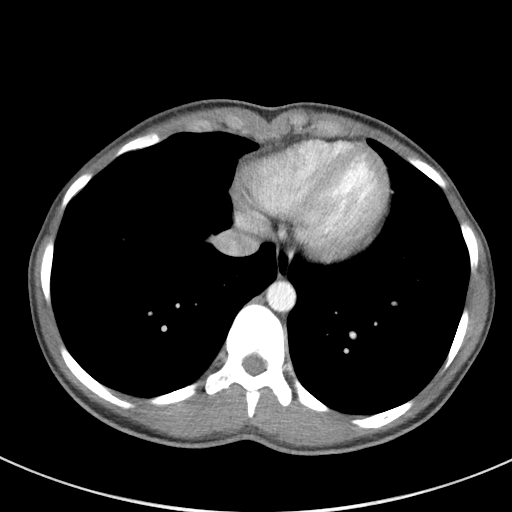

[Series 7: coronal st 2 · coronal · 0.64mm/px · 3 of 64 slices shown]
[im 22/64  soft-tissue]
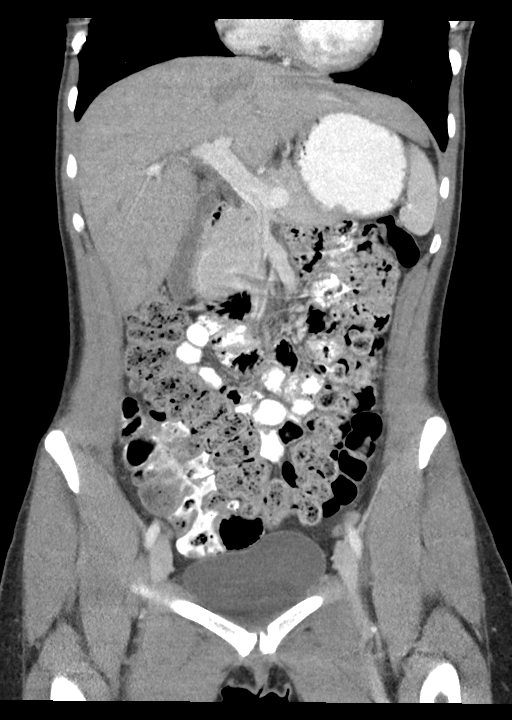
[im 29/64  soft-tissue]
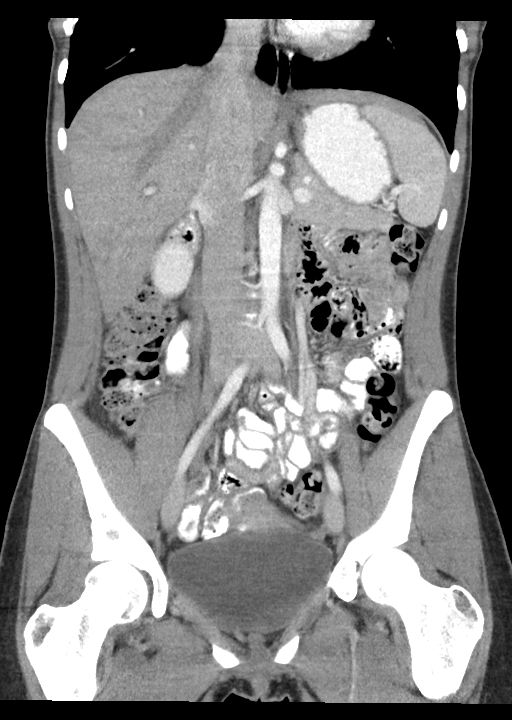
[im 36/64  soft-tissue]
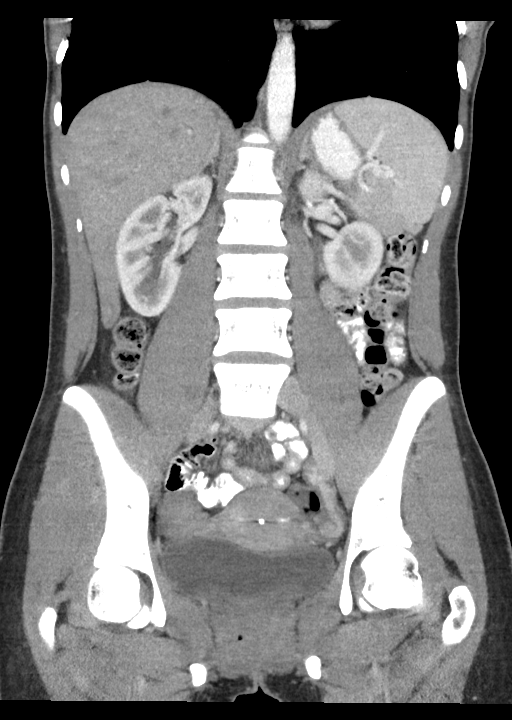

[16 of 46 positions shown; findings below may reference images not displayed]

FINDINGS: Lower chest: The visualized heart size within normal limits. No
pericardial fluid/thickening.

No hiatal hernia.

The visualized portions of the lungs are clear.

Hepatobiliary: The liver is normal in density without focal
abnormality.The main portal vein is patent. No evidence of calcified
gallstones, gallbladder wall thickening or biliary dilatation.

Pancreas: Unremarkable. No pancreatic ductal dilatation or
surrounding inflammatory changes.

Spleen: Normal in size without focal abnormality.

Adrenals/Urinary Tract: Both adrenal glands appear normal. The
kidneys and collecting system appear normal without evidence of
urinary tract calculus or hydronephrosis. Bladder is unremarkable.

Stomach/Bowel: The stomach, small bowel, and colon are normal in
appearance. No inflammatory changes, wall thickening, or obstructive
findings. A moderate large amount of colonic stool is present. The
appendix is unremarkable.

Vascular/Lymphatic: There are no enlarged mesenteric,
retroperitoneal, or pelvic lymph nodes. No significant vascular
findings are present.

Reproductive: IUD seen within the endometrial canal.

Other: No evidence of abdominal wall mass or hernia.

Musculoskeletal: No acute or significant osseous findings.
IMPRESSION: No acute intra-abdominal or pelvic pathology to explain the
patient's symptoms.

Moderate to large amount of colonic stool without evidence of
obstruction.

## 2021-02-03 IMAGING — CR DG CHEST 2V
1 series · 3 of 3 positions shown · non-contrast
Comparison: February 06, 2020

CLINICAL DATA: Chest pain.  Seizure.

EXAM:
CHEST - 2 VIEW

[Series 1: x chest ap · 0.14mm/px · 3 of 3 slices shown]
[im 1/3]
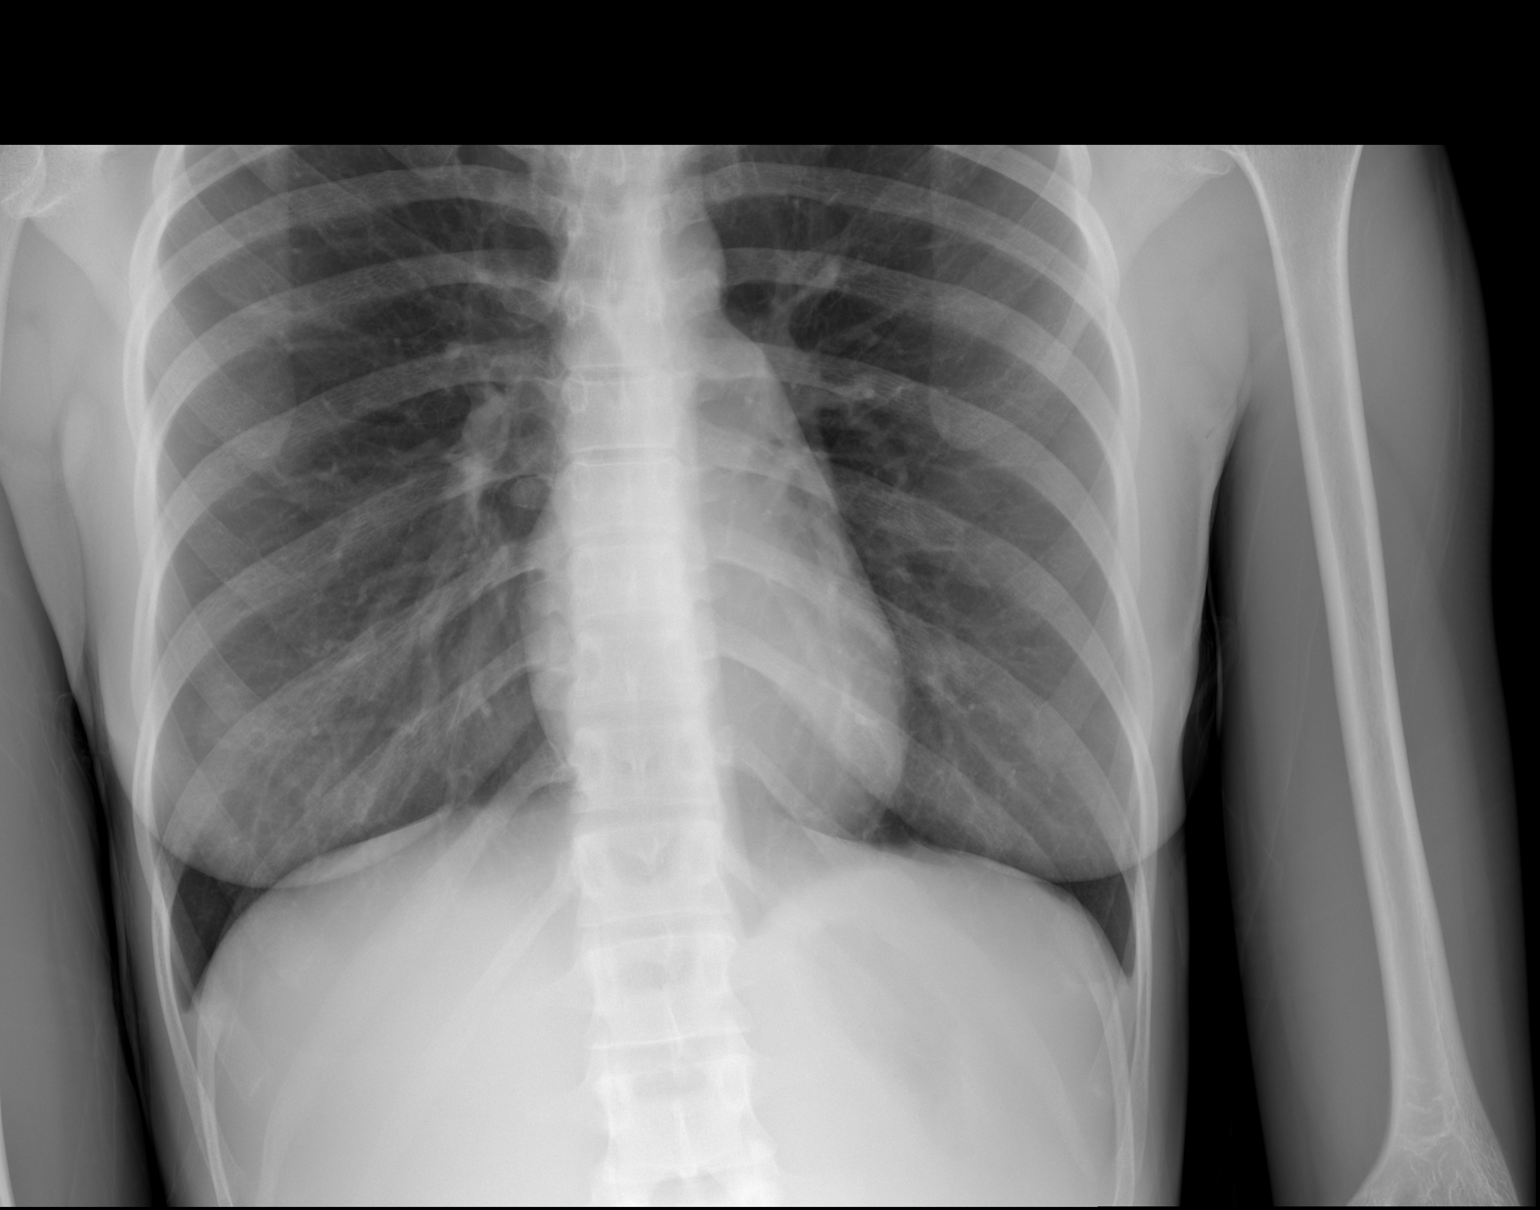
[im 2/3]
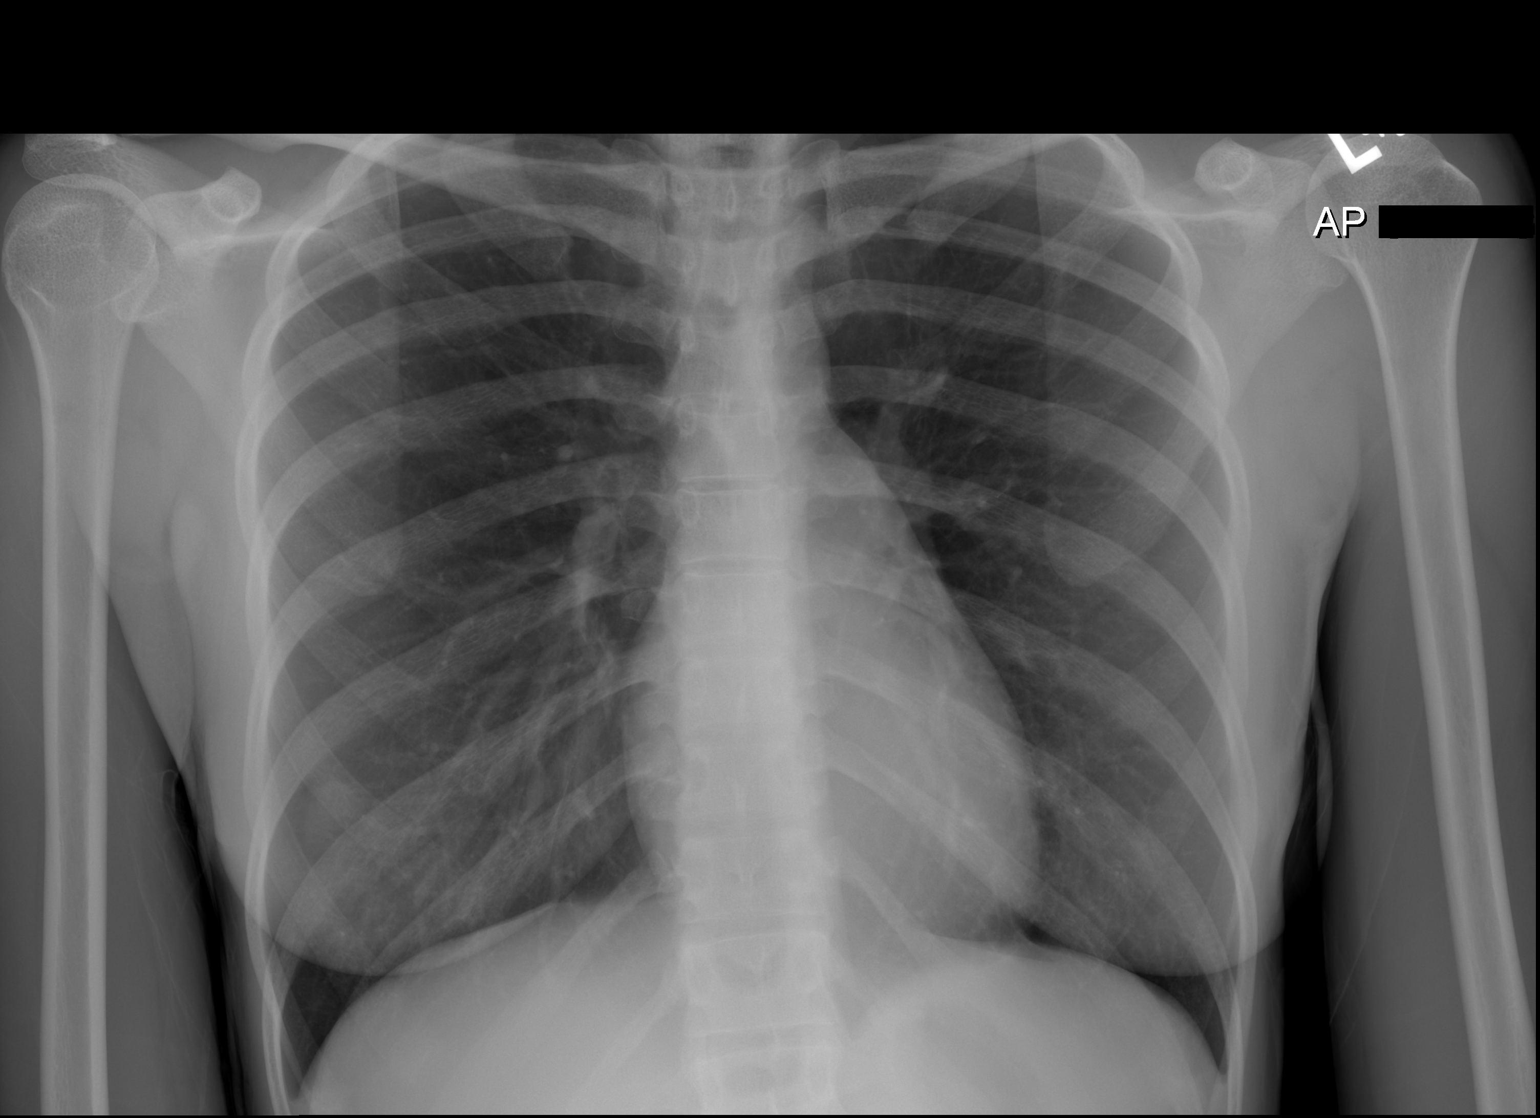
[im 3/3]
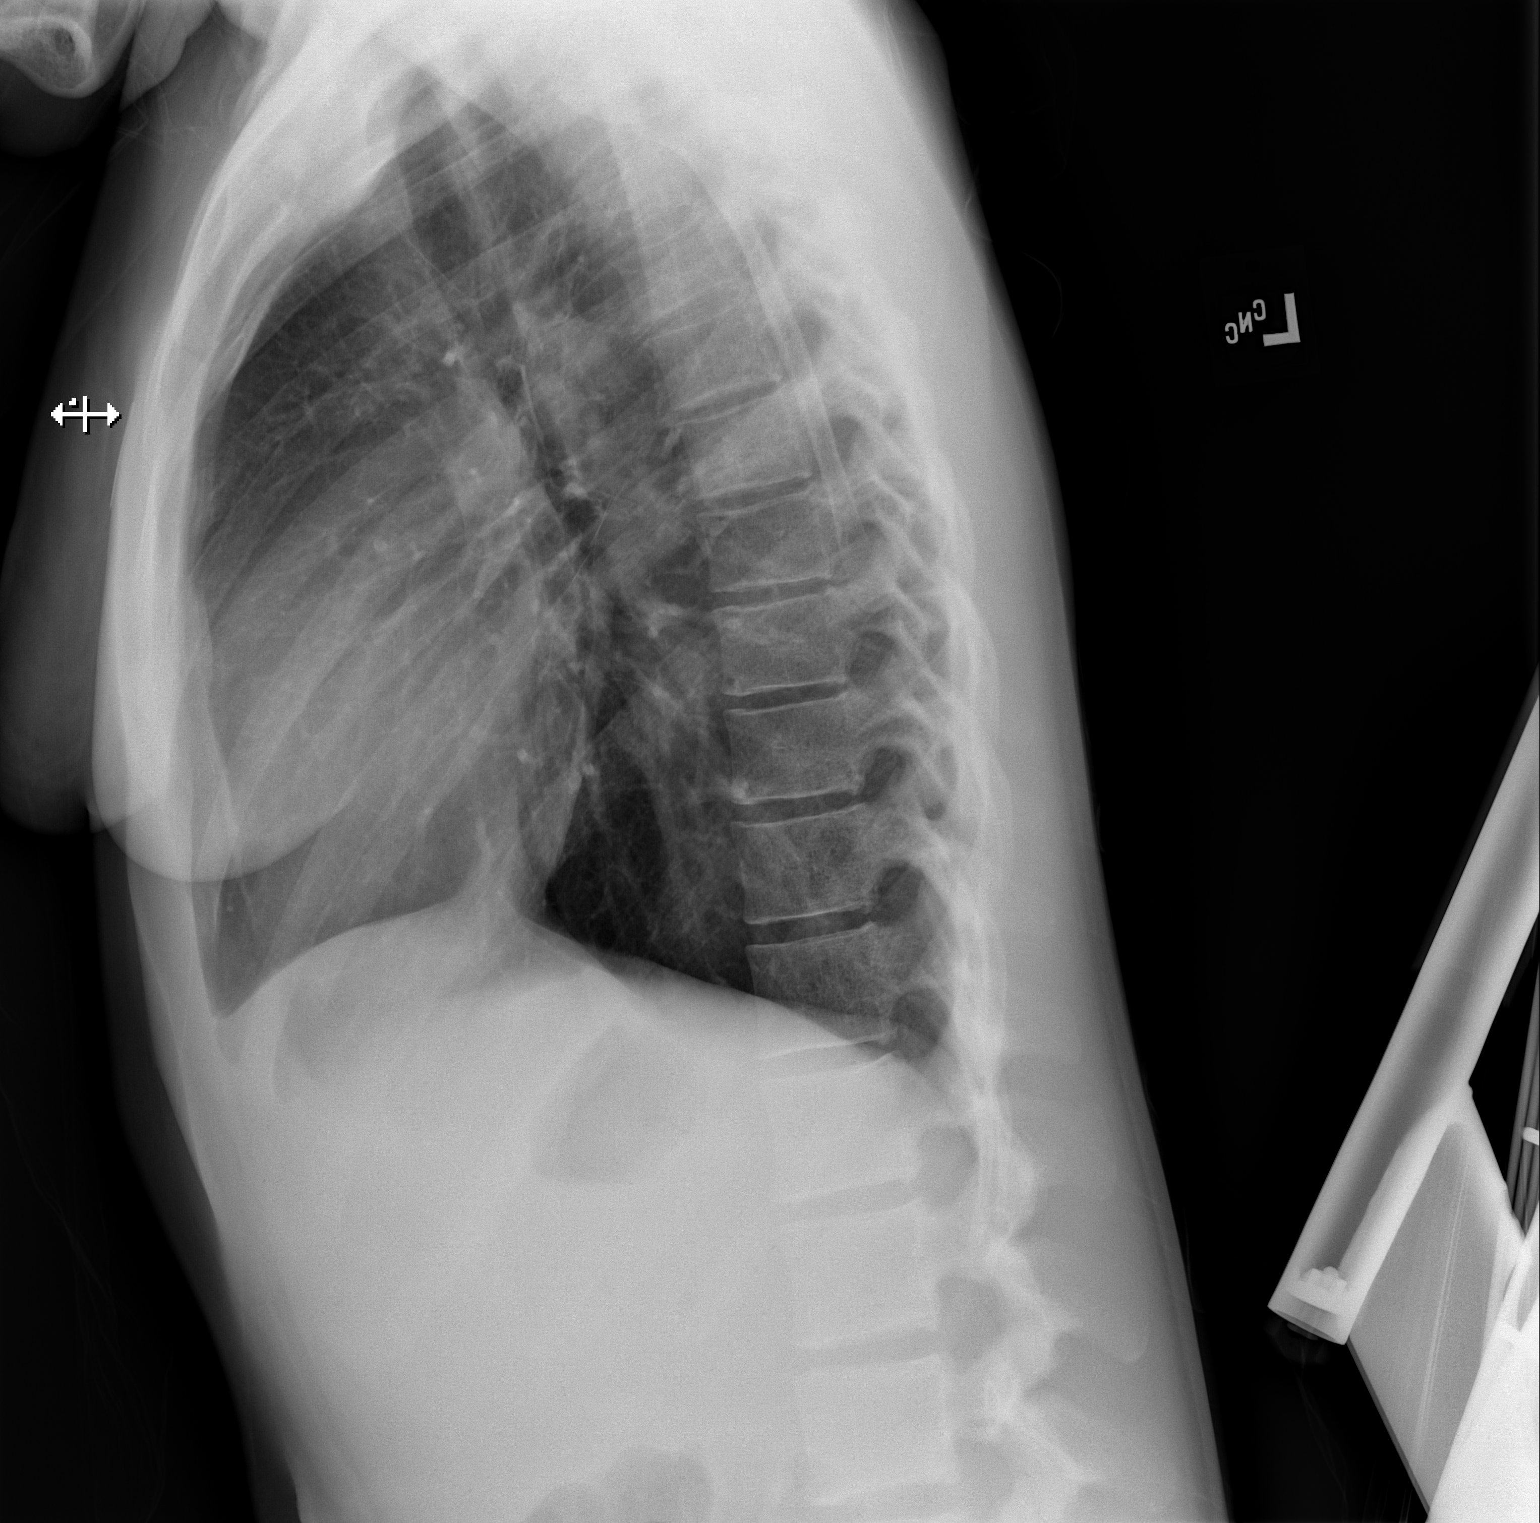

[3 of 3 positions shown; findings below may reference images not displayed]

FINDINGS: Lungs are clear. Heart size and pulmonary vascularity are normal. No
adenopathy. No pneumothorax. No bone lesions.
IMPRESSION: Lungs clear.  Heart size normal.  No evident pneumothorax.
